# Patient Record
Sex: Female | Born: 1950 | Race: White | Hispanic: No | Marital: Single | Smoking: Current every day smoker
Health system: Southern US, Community
[De-identification: ages and names within clinical notes are randomized; demographics above are authoritative.]

## PROBLEM LIST (undated history)

## (undated) DIAGNOSIS — H8103 Meniere's disease, bilateral: Secondary | ICD-10-CM

## (undated) DIAGNOSIS — M8580 Other specified disorders of bone density and structure, unspecified site: Secondary | ICD-10-CM

## (undated) DIAGNOSIS — K5790 Diverticulosis of intestine, part unspecified, without perforation or abscess without bleeding: Secondary | ICD-10-CM

## (undated) DIAGNOSIS — M1990 Unspecified osteoarthritis, unspecified site: Secondary | ICD-10-CM

## (undated) DIAGNOSIS — H269 Unspecified cataract: Secondary | ICD-10-CM

## (undated) HISTORY — DX: Unspecified cataract: H26.9

## (undated) HISTORY — DX: Meniere's disease, bilateral: H81.03

## (undated) HISTORY — PX: THYROIDECTOMY, PARTIAL: SHX18

## (undated) HISTORY — DX: Diverticulosis of intestine, part unspecified, without perforation or abscess without bleeding: K57.90

## (undated) HISTORY — PX: ABDOMINAL HYSTERECTOMY: SHX81

## (undated) HISTORY — PX: CHOLECYSTECTOMY: SHX55

## (undated) HISTORY — DX: Other specified disorders of bone density and structure, unspecified site: M85.80

## (undated) HISTORY — PX: EYE SURGERY: SHX253

## (undated) HISTORY — DX: Unspecified osteoarthritis, unspecified site: M19.90

---

## 2018-09-17 ENCOUNTER — Ambulatory Visit: Payer: Self-pay

## 2018-09-17 NOTE — Telephone Encounter (Signed)
Pt. Reports she started having swelling and pain to right leg 1 week ago. Has tried ice, rest and elevating the leg. Pain and swelling are now in her calf. Instructed to go to ED for evaluation. Refuses, states she will go to [facility]. Pt. Spoke with Madelyn to establish care.  Reason for Disposition . Patient sounds very sick or weak to the triager  Answer Assessment - Initial Assessment Questions 1. ONSET: "When did the swelling start?" (e.g., minutes, hours, days)     1 week ago 2. LOCATION: "What part of the leg is swollen?"  "Are both legs swollen or just one leg?"     Right leg 3. SEVERITY: "How bad is the swelling?" (e.g., localized; mild, moderate, severe)  - Localized - small area of swelling localized to one leg  - MILD pedal edema - swelling limited to foot and ankle, pitting edema < 1/4 inch (6 mm) deep, rest and elevation eliminate most or all swelling  - MODERATE edema - swelling of lower leg to knee, pitting edema > 1/4 inch (6 mm) deep, rest and elevation only partially reduce swelling  - SEVERE edema - swelling extends above knee, facial or hand swelling present      Back of calf 4. REDNESS: "Does the swelling look red or infected?"     No 5. PAIN: "Is the swelling painful to touch?" If so, ask: "How painful is it?"   (Scale 1-10; mild, moderate or severe)     8 6. FEVER: "Do you have a fever?" If so, ask: "What is it, how was it measured, and when did it start?"      No 7. CAUSE: "What do you think is causing the leg swelling?"     Maybe a blood clot 8. MEDICAL HISTORY: "Do you have a history of heart failure, kidney disease, liver failure, or cancer?"     No 9. RECURRENT SYMPTOM: "Have you had leg swelling before?" If so, ask: "When was the last time?" "What happened that time?"     No 10. OTHER SYMPTOMS: "Do you have any other symptoms?" (e.g., chest pain, difficulty breathing)       No 11. PREGNANCY: "Is there any chance you are pregnant?" "When was your last menstrual  period?"       No  Protocols used: LEG SWELLING AND EDEMA-A-AH

## 2018-10-05 ENCOUNTER — Ambulatory Visit (INDEPENDENT_AMBULATORY_CARE_PROVIDER_SITE_OTHER): Payer: Medicare Other | Admitting: Family Medicine

## 2018-10-05 ENCOUNTER — Encounter: Payer: Self-pay | Admitting: Family Medicine

## 2018-10-05 ENCOUNTER — Ambulatory Visit (INDEPENDENT_AMBULATORY_CARE_PROVIDER_SITE_OTHER): Payer: Medicare Other

## 2018-10-05 ENCOUNTER — Other Ambulatory Visit: Payer: Self-pay

## 2018-10-05 VITALS — BP 126/76 | HR 78 | Temp 98.0°F | Ht 67.5 in | Wt 188.2 lb

## 2018-10-05 DIAGNOSIS — M25551 Pain in right hip: Secondary | ICD-10-CM

## 2018-10-05 DIAGNOSIS — M7121 Synovial cyst of popliteal space [Baker], right knee: Secondary | ICD-10-CM

## 2018-10-05 DIAGNOSIS — Z23 Encounter for immunization: Secondary | ICD-10-CM

## 2018-10-05 DIAGNOSIS — M8580 Other specified disorders of bone density and structure, unspecified site: Secondary | ICD-10-CM | POA: Insufficient documentation

## 2018-10-05 DIAGNOSIS — M545 Low back pain, unspecified: Secondary | ICD-10-CM | POA: Insufficient documentation

## 2018-10-05 DIAGNOSIS — H8103 Meniere's disease, bilateral: Secondary | ICD-10-CM | POA: Insufficient documentation

## 2018-10-05 MED ORDER — DICLOFENAC SODIUM 1 % TD GEL: 4.0000 g | Freq: Four times a day (QID) | TRANSDERMAL | 2 refills | Status: DC

## 2018-10-05 NOTE — Progress Notes (Signed)
Patient: Unkown Tedford MRN: [id number] DOB: 27-Dec-1950 PCP: Orma Flaming, MD     Subjective:  Chief Complaint  Patient presents with  . Establish Care    HPI: The patient is a 68 y.o. female who presents today for establishing care. She has no medical problems except for meniere's disease.   Maternal history of skin cancer, depression, CAD/MI, hyperlipidemia, HTN, CVA.  Paternal history of hearing loss, CAD/MI, HTN, CVA.  2 older sisters: HTN/hyperlipidemia Identical twin with HTN.   Tdap: 05/21/2011 Pneumovax: 02/11/2017 Colonoscopy: 11/04/2011. F/u 10 years.  Mmg: 02/13/2018 Dexa: 12/09/2016  Right knee pain: she was told she had a ruptured bakers cyst. She feels like this is resolving. She states her right knee still fills full. She was seen at [facility] and had an ultrasound done. No xray's were taken. Pain started at end of July. She did conservative therapy with ice, elevation. Pain got so bad she went and got seen. No other treatment was done. She states her pain now is an 8/10 at it's worse. Pain is sharp and stabbing. No radiation. Nothing makes it better (otc pain meds, ice/etc). She is not sure what makes it worse.   Right hip pain: stated last week. She states this is intermittent in nature. She thinks it is worse in the AM. Pain rated as an 8/10 and is sharp and shooting. Does not radiate. Pain is only a few seconds and stops with her next motion. Described as a pinch. Does not take otc medication for this. Unsure if ever had an xray. No recent trauma. She has had a couple of falls years ago. No weakness, loss of sensation.   Review of Systems  Constitutional: Negative for chills, fatigue and fever.  HENT: Positive for postnasal drip. Negative for congestion and rhinorrhea.   Eyes: Negative for visual disturbance.  Respiratory: Negative for cough, chest tightness and shortness of breath.   Cardiovascular: Positive for leg swelling. Negative for chest pain and palpitations.        C/o right calf swelling  Gastrointestinal: Negative for abdominal pain, diarrhea, nausea and vomiting.  Endocrine: Negative for polydipsia and polyuria.  Genitourinary: Negative for flank pain and urgency.  Musculoskeletal: Positive for arthralgias and back pain. Negative for neck pain.       R knee feels "warm to touch" no c/o pain  Skin: Negative for rash.  Neurological: Negative for dizziness and headaches.  Psychiatric/Behavioral: Negative for sleep disturbance. The patient is not nervous/anxious.     Allergies Patient is allergic to erythromycin and morphine and related.  Past Medical History Patient  has a past medical history of Meniere's disease, bilateral.  Surgical History Patient  has a past surgical history that includes Thyroidectomy, partial; Abdominal hysterectomy; Cholecystectomy; and Eye surgery.  Family History Pateint's family history is not on file.  Social History Patient  reports that she has been smoking cigarettes. She has never used smokeless tobacco. She reports current alcohol use of about 21.0 standard drinks of alcohol per week. She reports that she does not use drugs.    Objective: Vitals:   10/05/18 0818  BP: 126/76  Pulse: 78  Temp: 98 F (36.7 C)  TempSrc: Skin  SpO2: 96%  Weight: 188 lb 3.2 oz (85.4 kg)  Height: 5' 7.5" (1.715 m)    Body mass index is 29.04 kg/m. Physical Exam  Constitutional: She is well-developed, well-nourished, and in no distress.  Musculoskeletal:     Comments: Left knee: mild edema in popliteal fossa. ROM  wnl. Full extension to 180+ and full flexion to 90 degrees. Negative draw and mcmurrays signs. Negative pain with valgus and varus strain. Gait normal.   Left hip: strength 5/5 for adduction and abduction. Sensation intact. Point tenderness over lateral aspect of hip (greater trochanter). Gait normal.         Depression screen PHQ 2/9 10/05/2018  Decreased Interest 1  Down, Depressed, Hopeless 1  PHQ  - 2 Score 2  Altered sleeping 0  Tired, decreased energy 3  Change in appetite 0  Feeling bad or failure about yourself  0  Trouble concentrating 0  Moving slowly or fidgety/restless 0  Suicidal thoughts 0  PHQ-9 Score 5  Difficult doing work/chores Not difficult at all    Assessment/plan: 1. Need for prophylactic vaccination and inoculation against influenza High dose flu shot today.   2. Baker's cyst of knee, right Xray with no acute findings and no severe OA (mild tricompartmental disease). Discussed conservative treatment vs. Referral to sports medicine. She would like to do conservative therapy at this time as it seems to slowly be getting better.  - DG Knee 1-2 Views Right; Future  3. Right hip pain Appears like bursitis. Will do conservative treatment with voltaren gel qid, ice and exercises. If not better recommend we send her to sports med for possible injection.  - DG HIP UNILAT W OR W/O PELVIS 2-3 VIEWS RIGHT; Future  -requesting records for her HM and other treatment.   -phq9 score a 5. Very mild and due to family issues going on right now. Situational and she denies it affecting her life. Will continue to monitor, but no medication needed at this time.   F/u in one month for annual with fasting labs.    Orma Flaming, MD [place]   10/05/2018

## 2018-10-05 NOTE — Patient Instructions (Addendum)
I think you have inflammation of the bursitis of your greater trochanter of your right hip.Kimberly KitchenMarland [name]  1) I sent in anti inflammatory cream for you to use up to four times a day. You can do ice, but after 72 hours I recommend heat. Also am giving you exercises to do. If not getting better recommend we send to PT/sport medicine.   2) baker's cyst: slowly will resolve, but if continues to hurt you would get injection with sports med. Just email me and I can refer you.   Come back for annual/labs!   Also need you to request your records so I can get hard copy of mammogram, colonscopy, bone scan and shots.   So nice to meet you!  DR. Jamilee Lafosse    Hip Bursitis Rehab Ask your health care provider which exercises are safe for you. Do exercises exactly as told by your health care provider and adjust them as directed. It is normal to feel mild stretching, pulling, tightness, or discomfort as you do these exercises. Stop right away if you feel sudden pain or your pain gets worse. Do not begin these exercises until told by your health care provider. Stretching exercise This exercise warms up your muscles and joints and improves the movement and flexibility of your hip. This exercise also helps to relieve pain and stiffness. Iliotibial band stretch An iliotibial band is a strong band of muscle tissue that runs from the outer side of your hip to the outer side of your thigh and knee. 1. Lie on your side with your left / right leg in the top position. 2. Bend your left / right knee and grab your ankle. Stretch out your bottom arm to help you balance. 3. Slowly bring your knee back so your thigh is behind your body. 4. Slowly lower your knee toward the floor until you feel a gentle stretch on the outside of your left / right thigh. If you do not feel a stretch and your knee will not fall farther, place the heel of your other foot on top of your knee and pull your knee down toward the floor with your foot. 5. Hold this  position for __________ seconds. 6. Slowly return to the starting position. Repeat __________ times. Complete this exercise __________ times a day. Strengthening exercises These exercises build strength and endurance in your hip and pelvis. Endurance is the ability to use your muscles for a long time, even after they get tired. Bridge This exercise strengthens the muscles that move your thigh backward (hip extensors). 1. Lie on your back on a firm surface with your knees bent and your feet flat on the floor. 2. Tighten your buttocks muscles and lift your buttocks off the floor until your trunk is level with your thighs. ? Do not arch your back. ? You should feel the muscles working in your buttocks and the back of your thighs. If you do not feel these muscles, slide your feet 1-2 inches (2.5-5 cm) farther away from your buttocks. ? If this exercise is too easy, try doing it with your arms crossed over your chest. 3. Hold this position for __________ seconds. 4. Slowly lower your hips to the starting position. 5. Let your muscles relax completely after each repetition. Repeat __________ times. Complete this exercise __________ times a day. Squats This exercise strengthens the muscles in front of your thigh and knee (quadriceps). 1. Stand in front of a table, with your feet and knees pointing straight ahead. You may  rest your hands on the table for balance but not for support. 2. Slowly bend your knees and lower your hips like you are going to sit in a chair. ? Keep your weight over your heels, not over your toes. ? Keep your lower legs upright so they are parallel with the table legs. ? Do not let your hips go lower than your knees. ? Do not bend lower than told by your health care provider. ? If your hip pain increases, do not bend as low. 3. Hold the squat position for __________ seconds. 4. Slowly push with your legs to return to standing. Do not use your hands to pull yourself to  standing. Repeat __________ times. Complete this exercise __________ times a day. Hip hike 1. Stand sideways on a bottom step. Stand on your left / right leg with your other foot unsupported next to the step. You can hold on to the railing or wall for balance if needed. 2. Keep your knees straight and your torso square. Then lift your left / right hip up toward the ceiling. 3. Hold this position for __________ seconds. 4. Slowly let your left / right hip lower toward the floor, past the starting position. Your foot should get closer to the floor. Do not lean or bend your knees. Repeat __________ times. Complete this exercise __________ times a day. Single leg stand 1. Without shoes, stand near a railing or in a doorway. You may hold on to the railing or door frame as needed for balance. 2. Squeeze your left / right buttock muscles, then lift up your other foot. ? Do not let your left / right hip push out to the side. ? It is helpful to stand in front of a mirror for this exercise so you can watch your hip. 3. Hold this position for __________ seconds. Repeat __________ times. Complete this exercise __________ times a day. This information is not intended to replace advice given to you by your health care provider. Make sure you discuss any questions you have with your health care provider. Document Released: 03/07/2004 Document Revised: 05/25/2018 Document Reviewed: 05/25/2018 Elsevier Patient Education  2020 [employer].

## 2018-10-05 NOTE — Progress Notes (Deleted)
Phone: [phone]  Subjective:  Patient presents today for their Medicare Exam    Preventive Screening-Counseling & Management  Vision screen: *** No exam data present  Advanced directives: ***  Smoking Status: ***Never Smoker Second Hand Smoking status: ***No smokers in home  Risk Factors Regular exercise: *** Diet: ***  Fall Risk: ***None  No flowsheet data found. Opioid use history: *** no long term opioids use  Cardiac risk factors:  advanced age (older than 34 for men, 61 for women) *** Hyperlipidemia *** No diabetes. *** Family History: ***   Depression Screen None. PHQ2 0 *** No flowsheet data found.  Activities of Daily Living Independent ADLs and IADLs ***  Hearing Difficulties: ***-patient declines  Cognitive Testing No reported trouble.  ***  Normal 3 word recall  List the Names of Other Physician/Practitioners you currently use: -*** -***   There is no immunization history on file for this patient. Required Immunizations needed today ***  Screening tests- up to date Health Maintenance Due  Topic Date Due  . Hepatitis C Screening  06/06/1950  . TETANUS/TDAP  05/24/1968  . MAMMOGRAM  05/25/1999  . COLONOSCOPY  05/25/1999  . DEXA SCAN  05/25/2014  . PNA vac Low Risk Adult (1 of 2 - PCV13) 05/25/2014  . INFLUENZA VACCINE  09/12/2018    ROS- No pertinent positives discovered in course of AWV  The following were reviewed and entered/updated in epic: No past medical history on file. There are no active problems to display for this patient.  *** The histories are not reviewed yet. Please review them in the "History" navigator section and refresh this [place].  No family history on file.  Medications- reviewed and updated No current outpatient medications on file.   No current facility-administered medications for this visit.     Allergies-reviewed and updated Not on File  Social History   Socioeconomic History  .  Marital status: Not on file    Spouse name: Not on file  . Number of children: Not on file  . Years of education: Not on file  . Highest education level: Not on file  Occupational History  . Not on file  Social Needs  . Financial resource strain: Not on file  . Food insecurity    Worry: Not on file    Inability: Not on file  . Transportation needs    Medical: Not on file    Non-medical: Not on file  Tobacco Use  . Smoking status: Not on file  Substance and Sexual Activity  . Alcohol use: Not on file  . Drug use: Not on file  . Sexual activity: Not on file  Lifestyle  . Physical activity    Days per week: Not on file    Minutes per session: Not on file  . Stress: Not on file  Relationships  . Social Herbalist on phone: Not on file    Gets together: Not on file    Attends religious service: Not on file    Active member of club or organization: Not on file    Attends meetings of clubs or organizations: Not on file    Relationship status: Not on file  Other Topics Concern  . Not on file  Social History Narrative  . Not on file    Objective: There were no vitals taken for this visit. Gen: NAD, resting comfortably HEENT: Mucous membranes are moist. Oropharynx normal Neck: no thyromegaly CV: RRR no murmurs rubs or gallops Lungs:  CTAB no crackles, wheeze, rhonchi Abdomen: soft/nontender/nondistended/normal bowel sounds. No rebound or guarding.  Ext: no edema Skin: warm, dry Neuro: grossly normal, moves all extremities, PERRLA  Assessment/Plan:  Welcome to Medicare exam completed- discussed recommended screenings anddocumented any personalized health advice and referrals for preventive counseling. See AVS as well which was given to patient.   Status of chronic or acute concerns  ***  No problem-specific Assessment & Plan notes found for this encounter.   Future Appointments  Date Time Provider [place]  10/05/2018  8:20 AM Orma Flaming, MD  [facility]   No follow-ups on file.   Lab/Order associations: No diagnosis found.  No orders of the defined types were placed in this encounter.   Return precautions advised. Clearnce Sorrel Izaiah Tabb, CMA

## 2018-10-05 NOTE — Progress Notes (Deleted)
Patient: Kimberly [name] MRN: [id number] DOB: 01/14/1951 PCP: Orma Flaming, MD     Subjective:  No chief complaint on file.   HPI: The patient is a 68 y.o. female who presents today for ***  Review of Systems  Constitutional: Negative for chills, fatigue and fever.  HENT: Positive for postnasal drip. Negative for congestion and rhinorrhea.   Eyes: Negative for visual disturbance.  Respiratory: Negative for cough, chest tightness and shortness of breath.   Cardiovascular: Positive for leg swelling. Negative for chest pain and palpitations.       C/o right calf swelling  Gastrointestinal: Negative for abdominal pain, diarrhea, nausea and vomiting.  Endocrine: Negative for polydipsia and polyuria.  Genitourinary: Negative for flank pain and urgency.  Musculoskeletal: Positive for arthralgias and back pain. Negative for neck pain.       R knee feels "warm to touch" no c/o pain  Skin: Negative for rash.  Neurological: Negative for dizziness and headaches.  Psychiatric/Behavioral: Negative for sleep disturbance. The patient is not nervous/anxious.     Allergies Patient has no allergies on file.  Past Medical History Patient  has no past medical history on file.  Surgical History Patient  has no past surgical history on file.  Family History Pateint's family history is not on file.  Social History Patient      Objective: There were no vitals filed for this visit.  There is no height or weight on file to calculate BMI.  Physical Exam     Depression screen PHQ 2/9 10/05/2018  Decreased Interest 1  Down, Depressed, Hopeless 1  PHQ - 2 Score 2  Altered sleeping 0  Tired, decreased energy 3  Change in appetite 0  Feeling bad or failure about yourself  0  Trouble concentrating 0  Moving slowly or fidgety/restless 0  Suicidal thoughts 0  PHQ-9 Score 5  Difficult doing work/chores Not difficult at all    Assessment/plan:      No follow-ups on  file.     @[facility] @ 10/05/2018

## 2019-03-05 ENCOUNTER — Ambulatory Visit: Payer: Medicare Other | Attending: Internal Medicine

## 2019-03-05 DIAGNOSIS — Z23 Encounter for immunization: Secondary | ICD-10-CM | POA: Insufficient documentation

## 2019-03-05 NOTE — Progress Notes (Signed)
   Covid-19 Vaccination Clinic  Name:  Kimberly [name]    MRN: [id number] DOB: 1950-05-02  03/05/2019  [name] was observed post Covid-19 immunization for 15 minutes without incidence. She was provided with Vaccine Information Sheet and instruction to access the V-Safe system.   [name] was instructed to call 911 with any severe reactions post vaccine: Marland Kitchen Difficulty breathing  . Swelling of your face and throat  . A fast heartbeat  . A bad rash all over your body  . Dizziness and weakness    Immunizations Administered    Name Date Dose VIS Date Route   Pfizer COVID-19 Vaccine 03/05/2019  9:21 AM 0.3 mL 01/22/2019 Intramuscular   Manufacturer: [place]   Lot: [id number]   [place]: [id number]

## 2019-03-25 ENCOUNTER — Ambulatory Visit: Payer: Medicare Other | Attending: Internal Medicine

## 2019-03-25 DIAGNOSIS — Z23 Encounter for immunization: Secondary | ICD-10-CM | POA: Insufficient documentation

## 2019-03-25 NOTE — Progress Notes (Signed)
   Covid-19 Vaccination Clinic  Name:  Kimberly [name]    MRN: [id number] DOB: [birth year]  03/25/2019  [name] was observed post Covid-19 immunization for 15 minutes without incidence. She was provided with Vaccine Information Sheet and instruction to access the V-Safe system.   [name] was instructed to call 911 with any severe reactions post vaccine: Marland Kitchen Difficulty breathing  . Swelling of your face and throat  . A fast heartbeat  . A bad rash all over your body  . Dizziness and weakness    Immunizations Administered    Name Date Dose VIS Date Route   Pfizer COVID-19 Vaccine 03/25/2019  2:27 PM 0.3 mL 01/22/2019 Intramuscular   Manufacturer: [place]   Lot: [id number]   [place]: [id number]

## 2019-07-13 ENCOUNTER — Encounter: Payer: Self-pay | Admitting: Physician Assistant

## 2019-07-13 ENCOUNTER — Ambulatory Visit (INDEPENDENT_AMBULATORY_CARE_PROVIDER_SITE_OTHER): Payer: Medicare Other | Admitting: Family Medicine

## 2019-07-13 ENCOUNTER — Other Ambulatory Visit: Payer: Self-pay

## 2019-07-13 ENCOUNTER — Encounter: Payer: Self-pay | Admitting: Family Medicine

## 2019-07-13 ENCOUNTER — Ambulatory Visit (INDEPENDENT_AMBULATORY_CARE_PROVIDER_SITE_OTHER): Payer: Medicare Other | Admitting: Physician Assistant

## 2019-07-13 ENCOUNTER — Ambulatory Visit: Payer: Self-pay

## 2019-07-13 ENCOUNTER — Ambulatory Visit (INDEPENDENT_AMBULATORY_CARE_PROVIDER_SITE_OTHER): Payer: Medicare Other

## 2019-07-13 VITALS — BP 120/72 | HR 76 | Temp 97.6°F | Ht 67.5 in | Wt 190.0 lb

## 2019-07-13 VITALS — BP 116/68 | HR 68 | Ht 67.5 in | Wt 189.0 lb

## 2019-07-13 DIAGNOSIS — M25562 Pain in left knee: Secondary | ICD-10-CM

## 2019-07-13 DIAGNOSIS — S76311A Strain of muscle, fascia and tendon of the posterior muscle group at thigh level, right thigh, initial encounter: Secondary | ICD-10-CM | POA: Diagnosis not present

## 2019-07-13 NOTE — Assessment & Plan Note (Addendum)
Partial tear of the right hamstring noted.  No pain with more of extension of the leg.  We discussed thigh compression sleeve, potential heel lift, shorter stride length, icing regimen, topical anti-inflammatories.  Patient declined formal physical therapy and likely will do relatively well with conservative therapy.  Follow-up with me again in 4 to 6 weeks we did discuss with patient about the potential for a DVT.  Patient had a potential scare previously last year and went to the emergency room.  Patient would like to avoid doing that again.  Did not feel though that she needed a scan today.

## 2019-07-13 NOTE — Patient Instructions (Addendum)
It was great to see you!  A referral has been placed for you to see Dr. Lynne Leader or Dr. [name] with [facility]. Someone from there office will be in touch soon regarding your appointment with him. His location: [place] at [facility] [address] on the 1st floor.   Phone number [phone], Fax [phone].  This location is across the street from the entrance to [employer] and in the same complex as the [facility] and [employer]   Acute Knee Pain, Adult Many things can cause knee pain. Sometimes, knee pain is sudden (acute) and may be caused by damage, swelling, or irritation of the muscles and tissues that support your knee. The pain often goes away on its own with time and rest. If the pain does not go away, tests may be done to find out what is causing the pain. Follow these instructions at home: Pay attention to any changes in your symptoms. Take these actions to relieve your pain. If you have a knee sleeve or brace:   Wear the sleeve or brace as told by your doctor. Remove it only as told by your doctor.  Loosen the sleeve or brace if your toes: ? Tingle. ? Become numb. ? Turn cold and blue.  Keep the sleeve or brace clean.  If the sleeve or brace is not waterproof: ? Do not let it get wet. ? Cover it with a watertight covering when you take a bath or shower. Activity  Rest your knee.  Do not do things that cause pain.  Avoid activities where both feet leave the ground at the same time (high-impact activities). Examples are running, jumping rope, and doing jumping jacks.  Work with a physical therapist to make a safe exercise program, as told by your doctor. Managing pain, stiffness, and swelling   If told, put ice on the knee: ? Put ice in a plastic bag. ? Place a towel between your skin and the bag. ? Leave the ice on for 20 minutes, 2-3 times a day.  If told, put pressure (compression) on  your injured knee to control swelling, give support, and help with discomfort. Compression may be done with an elastic bandage. General instructions  Take all medicines only as told by your doctor.  Raise (elevate) your knee while you are sitting or lying down. Make sure your knee is higher than your heart.  Sleep with a pillow under your knee.  Do not use any products that contain nicotine or tobacco. These include cigarettes, e-cigarettes, and chewing tobacco. These products may slow down healing. If you need help quitting, ask your doctor.  If you are overweight, work with your doctor and a food expert (dietitian) to set goals to lose weight. Being overweight can make your knee hurt more.  Keep all follow-up visits as told by your doctor. This is important. Contact a doctor if:  The knee pain does not stop.  The knee pain changes or gets worse.  You have a fever along with knee pain.  Your knee feels warm when you touch it.  Your knee gives out or locks up. Get help right away if:  Your knee swells, and the swelling gets worse.  You cannot move your knee.  You have very bad knee pain. Summary  Many things can cause knee pain. The pain often goes away on its own with time and rest.  Your doctor may do tests to find  out the cause of the pain.  Pay attention to any changes in your symptoms. Relieve your pain with rest, medicines, light activity, and use of ice.  Get help right away if you cannot move your knee or your knee pain is very bad. This information is not intended to replace advice given to you by your health care provider. Make sure you discuss any questions you have with your health care provider. Document Revised: 07/10/2017 Document Reviewed: 07/10/2017 Elsevier Patient Education  [employer].   If you do not hear anything by the end of the day -- please call our office.  Take care,  Inda Coke PA-C

## 2019-07-13 NOTE — Progress Notes (Addendum)
Kimberly [name] is a 69 y.o. female here for a new problem.  I acted as a Education administrator for [employer], PA-C Anselmo Pickler, LPN   History of Present Illness:   Chief Complaint  Patient presents with  . Knee Pain    HPI   Knee pain Pt c/o left knee pain, started about a week ago with stiffness in the morning. Yesterday woke up with pain and unable to bear weight on it yesterday. Hears clicking in knee. She took aleve but caused her feet to swell and lower abd pain. She took tylenol and this caused abdominal pain too.  Denies any significant trauma but states that she "may have" twisted it over the weekend. Denies calf tenderness, numbness/tingling.   Past Medical History:  Diagnosis Date  . Meniere's disease, bilateral      Social History   Tobacco Use  . Smoking status: Current Every Day Smoker    Types: Cigarettes  . Smokeless tobacco: Never Used  Substance Use Topics  . Alcohol use: Yes    Alcohol/week: 21.0 standard drinks    Types: 21 Glasses of wine per week    Comment: 3 glasses of wine per day  . Drug use: Never    Past Surgical History:  Procedure Laterality Date  . ABDOMINAL HYSTERECTOMY    . CHOLECYSTECTOMY    . EYE SURGERY    . THYROIDECTOMY, PARTIAL      History reviewed. No pertinent family history.  Allergies  Allergen Reactions  . Erythromycin Diarrhea  . Clindamycin/Lincomycin Nausea And Vomiting  . Morphine And Related Itching    Current Medications:   Current Outpatient Medications:  .  acetaminophen (TYLENOL) 500 MG tablet, Take 1,000 mg by mouth every 6 (six) hours as needed., Disp: , Rfl:  .  aspirin EC 81 MG tablet, Take 81 mg by mouth daily., Disp: , Rfl:  .  calcium-vitamin D (OSCAL WITH D) 250-125 MG-UNIT tablet, Take 1 tablet by mouth daily., Disp: , Rfl:  .  cetirizine (ZYRTEC) 10 MG tablet, Take 10 mg by mouth daily., Disp: , Rfl:  .  Multiple Vitamins-Minerals (HAIR SKIN AND NAILS FORMULA PO), Take by mouth., Disp: , Rfl:  .   Multiple Vitamins-Minerals (WOMENS MULTIVITAMIN PO), Take by mouth., Disp: , Rfl:  .  vitamin E (VITAMIN E) 200 UNIT capsule, Take 200 Units by mouth daily., Disp: , Rfl:    Review of Systems:   ROS  Negative unless otherwise specified per HPI.  Vitals:   Vitals:   07/13/19 0903  BP: 120/72  Pulse: 76  Temp: 97.6 F (36.4 C)  TempSrc: Temporal  SpO2: 98%  Weight: 190 lb (86.2 kg)  Height: 5' 7.5" (1.715 m)     Body mass index is 29.32 kg/m.  Physical Exam:   Physical Exam Constitutional:      Appearance: She is well-developed.  HENT:     Head: Normocephalic and atraumatic.  Eyes:     Conjunctiva/sclera: Conjunctivae normal.  Cardiovascular:     Pulses:          Dorsalis pedis pulses are 2+ on the left side.       Posterior tibial pulses are 2+ on the left side.  Pulmonary:     Effort: Pulmonary effort is normal.  Musculoskeletal:        General: Normal range of motion.     Cervical back: Normal range of motion and neck supple.     Comments: Decreased FROM of L knee, mostly with  passive extension. TTP of medial L knee.   Skin:    General: Skin is warm and dry.  Neurological:     Mental Status: She is alert and oriented to person, place, and time.  Psychiatric:        Behavior: Behavior normal.        Thought Content: Thought content normal.        Judgment: Judgment normal.     No results found for this or any previous visit.  Assessment and Plan:   Lanasha was seen today for knee pain.  Diagnoses and all orders for this visit:  Acute pain of left knee -     Ambulatory referral to Sports Medicine   Urgent referral to sports medicine. Offered xray but we decided to defer imaging to sports medicine. Offered toradol injection but she declined, given recent swelling with advil. Knee wrapped with ACE bandage which was provided by our office, basic RICE discussed. Offered crutches, but patient declined. Consider purchasing OTC knee brace if needs further  stabilization, however hopefully she will be able to see sports medicine soon.  . Reviewed expectations re: course of current medical issues. . Discussed self-management of symptoms. . Outlined signs and symptoms indicating need for more acute intervention. . Patient verbalized understanding and all questions were answered. . See orders for this visit as documented in the electronic medical record. . Patient received an After-Visit Summary.  CMA or LPN served as scribe during this visit. History, Physical, and Plan performed by medical provider. The above documentation has been reviewed and is accurate and complete.  Inda Coke, PA-C

## 2019-07-13 NOTE — Patient Instructions (Addendum)
Xray today Thigh compression sleeve- Tommy Copper Voltarn gel  Ice 20 min 2x a day Exercises in one week Shorter stride length See me again in 4 weeks for manipulation

## 2019-07-13 NOTE — Progress Notes (Signed)
Kimberly Kimberly [name] [place] Phone: [phone] Subjective:   Kimberly Kimberly [name], am serving as a scribe for Dr. Hulan Saas. This visit occurred during the SARS-CoV-2 public health emergency.  Safety protocols were in place, including screening questions prior to the visit, additional usage of staff PPE, and extensive cleaning of exam room while observing appropriate contact time as indicated for disinfecting solutions.   I'm seeing this patient by the request  of:  Kimberly Flaming, MD  CC: Left knee pain  [id number]  Kimberly Kimberly [name] is a 69 y.o. female coming in with complaint of left knee pain. Patient states that for a few days her knee pain has progressively gotten worse. Antalgic gait. Using Aleve and Tylenol for pain. Patient states that she might have twisted knee in yard. Pain over medial aspect of knee.  Patient thinks that it seems to be worse when she tries to take longer strides.     Past Medical History:  Diagnosis Date  . Meniere's disease, bilateral    Past Surgical History:  Procedure Laterality Date  . ABDOMINAL HYSTERECTOMY    . CHOLECYSTECTOMY    . EYE SURGERY    . THYROIDECTOMY, PARTIAL     Social History   Socioeconomic History  . Marital status: Single    Spouse name: Not on file  . Number of children: Not on file  . Years of education: Not on file  . Highest education level: Not on file  Occupational History  . Not on file  Tobacco Use  . Smoking status: Current Every Day Smoker    Types: Cigarettes  . Smokeless tobacco: Never Used  Substance and Sexual Activity  . Alcohol use: Yes    Alcohol/week: 21.0 standard drinks    Types: 21 Glasses of wine per week    Comment: 3 glasses of wine per day  . Drug use: Never  . Sexual activity: Not Currently  Other Topics Concern  . Not on file  Social History Narrative  . Not on file   Social Determinants of Health   Financial Resource Strain:   .  Difficulty of Paying Living Expenses:   Food Insecurity:   . Worried About Charity fundraiser in the Last Year:   . Arboriculturist in the Last Year:   Transportation Needs:   . Film/video editor (Medical):   Marland Kitchen Lack of Transportation (Non-Medical):   Physical Activity:   . Days of Exercise per Week:   . Minutes of Exercise per Session:   Stress:   . Feeling of Stress :   Social Connections:   . Frequency of Communication with Friends and Family:   . Frequency of Social Gatherings with Friends and Family:   . Attends Religious Services:   . Active Member of Clubs or Organizations:   . Attends Archivist Meetings:   Marland Kitchen Marital Status:    Allergies  Allergen Reactions  . Erythromycin Diarrhea  . Clindamycin/Lincomycin Nausea And Vomiting  . Morphine And Related Itching   Kimberly [name] family history on file.    Current Outpatient Medications (Respiratory):  .  cetirizine (ZYRTEC) 10 MG tablet, Take 10 mg by mouth daily.  Current Outpatient Medications (Analgesics):  .  aspirin EC 81 MG tablet, Take 81 mg by mouth daily. Marland Kitchen  acetaminophen (TYLENOL) 500 MG tablet, Take 1,000 mg by mouth every 6 (six) hours as needed.   Current Outpatient Medications (Other):  .  calcium-vitamin D (OSCAL WITH D) 250-125 MG-UNIT tablet, Take 1 tablet by mouth daily. .  Multiple Vitamins-Minerals (HAIR SKIN AND NAILS FORMULA PO), Take by mouth. .  Multiple Vitamins-Minerals (WOMENS MULTIVITAMIN PO), Take by mouth. .  vitamin E (VITAMIN E) 200 UNIT capsule, Take 200 Units by mouth daily.   Reviewed prior external information including notes and imaging from  primary care provider As well as notes that were available from care everywhere and other healthcare systems.  Past medical history, social, surgical and family history all reviewed in electronic medical record.  Kimberly [name] unless stated regarding to the chief complaint.   Review of Systems:  Kimberly [name] headache, visual  changes, nausea, vomiting, diarrhea, constipation, dizziness, abdominal pain, skin rash, fevers, chills, night sweats, weight loss, swollen lymph nodes, body aches, joint swelling, chest pain, shortness of breath, mood changes. POSITIVE muscle aches, may be joint swelling  Objective  Blood pressure 116/68, pulse 68, height 5' 7.5" (1.715 m), weight 189 lb (85.7 kg), SpO2 98 %.   General: Kimberly [name] apparent distress alert and oriented x3 mood and affect normal, dressed appropriately.  HEENT: Pupils unequal with some mild strabismus Respiratory: Patient's speak in full sentences and does not appear short of breath  Cardiovascular: Trace lower extremity edema, non tender, Kimberly [name] erythema  Neuro: Cranial nerves II through XII are intact, neurovascularly intact in all extremities with 2+ DTRs and 2+ pulses.  Gait antalgic MSK:  tender with mild limited range of motion on multiple joints. Left knee exam does have tenderness to palpation over the medial joint line.  Patient does have tightness of the hamstrings noted and more pain on the distal hamstrings of the knee itself.  Patient has negative McMurray's.  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  Moderate arthritic changes of the patellofemoral joint noted.  Lateral joint line was fairly unremarkable.  Kimberly [name] significant swelling noted.  Patient does have what appears to be a partial tear of the distal hamstring tendon. Impression: Hamstring tear   Impression and Recommendations:     The above documentation has been reviewed and is accurate and complete Kimberly Pulley, DO       Note: This dictation was prepared with Dragon dictation along with smaller phrase technology. Any transcriptional errors that result from this process are unintentional.

## 2019-07-23 ENCOUNTER — Encounter: Payer: Self-pay | Admitting: Family Medicine

## 2019-08-10 ENCOUNTER — Encounter: Payer: Self-pay | Admitting: Family Medicine

## 2019-08-10 ENCOUNTER — Ambulatory Visit (INDEPENDENT_AMBULATORY_CARE_PROVIDER_SITE_OTHER): Payer: Medicare Other | Admitting: Family Medicine

## 2019-08-10 ENCOUNTER — Other Ambulatory Visit: Payer: Self-pay

## 2019-08-10 DIAGNOSIS — S76311A Strain of muscle, fascia and tendon of the posterior muscle group at thigh level, right thigh, initial encounter: Secondary | ICD-10-CM

## 2019-08-10 DIAGNOSIS — M545 Low back pain, unspecified: Secondary | ICD-10-CM

## 2019-08-10 DIAGNOSIS — M999 Biomechanical lesion, unspecified: Secondary | ICD-10-CM | POA: Diagnosis not present

## 2019-08-10 NOTE — Assessment & Plan Note (Signed)
Known arthritic changes.  Exacerbation of his underlying problem.  Patient is doing relatively well with the treatment and it did attempt osteopathic manipulation today.  Discussed with patient about icing regimen and home exercises, which activities to do which wants to avoid.  Increase activity as tolerated.  Follow-up again in 4 to 8 weeks

## 2019-08-10 NOTE — Patient Instructions (Signed)
See me again in 5-6 weeks 

## 2019-08-10 NOTE — Assessment & Plan Note (Signed)
Improvement noted at this time.  Should do relatively well with conservative therapy and follow-up again in 6 weeks

## 2019-08-10 NOTE — Assessment & Plan Note (Signed)

## 2019-08-10 NOTE — Progress Notes (Signed)
[place] Phone: [phone] Subjective:   Kimberly [name], am serving as a scribe for Dr. Hulan Saas. This visit occurred during the SARS-CoV-2 public health emergency.  Safety protocols were in place, including screening questions prior to the visit, additional usage of staff PPE, and extensive cleaning of exam room while observing appropriate contact time as indicated for disinfecting solutions.   I'm seeing this patient by the request  of:  Orma Flaming, MD  CC: Leg pain, back pain follow-up  IOX:[id number]   07/13/2019 Partial tear of the right hamstring noted.  [name] pain with more of extension of the leg.  We discussed thigh compression sleeve, potential heel lift, shorter stride length, icing regimen, topical anti-inflammatories.  Patient declined formal physical therapy and likely will do relatively well with conservative therapy.  Follow-up with me again in 4 to 6 weeks we did discuss with patient about the potential for a DVT.  Patient had a potential scare previously last year and went to the emergency room.  Patient would like to avoid doing that again.  Did not feel though that she needed a scan today.  Update 08/10/2019 Kimberly [name] is a 69 y.o. female coming in with complaint of right knee and hamstring pain. States that her pain dissipated the day after seeing [place].  Patient states that overall she is feeling better.  Having some tightness of the lower back.  Known arthritic changes.  Would like to be further evaluated for this.  Patient denies any significant radiation down the legs.  States that he has soreness in the back on a fairly regular basis though.    Past Medical History:  Diagnosis Date  . Meniere's disease, bilateral    Past Surgical History:  Procedure Laterality Date  . ABDOMINAL HYSTERECTOMY    . CHOLECYSTECTOMY    . EYE SURGERY    . THYROIDECTOMY, PARTIAL     Social History    Socioeconomic History  . Marital status: Single    Spouse name: Not on file  . Number of children: Not on file  . Years of education: Not on file  . Highest education level: Not on file  Occupational History  . Not on file  Tobacco Use  . Smoking status: Current Every Day Smoker    Types: Cigarettes  . Smokeless tobacco: Never Used  Vaping Use  . Vaping Use: Never used  Substance and Sexual Activity  . Alcohol use: Yes    Alcohol/week: 21.0 standard drinks    Types: 21 Glasses of wine per week    Comment: 3 glasses of wine per day  . Drug use: Never  . Sexual activity: Not Currently  Other Topics Concern  . Not on file  Social History Narrative  . Not on file   Social Determinants of Health   Financial Resource Strain:   . Difficulty of Paying Living Expenses:   Food Insecurity:   . Worried About Charity fundraiser in the Last Year:   . Arboriculturist in the Last Year:   Transportation Needs:   . Film/video editor (Medical):   Marland Kitchen Lack of Transportation (Non-Medical):   Physical Activity:   . Days of Exercise per Week:   . Minutes of Exercise per Session:   Stress:   . Feeling of Stress :   Social Connections:   . Frequency of Communication with Friends and Family:   . Frequency of  Social Gatherings with Friends and Family:   . Attends Religious Services:   . Active Member of Clubs or Organizations:   . Attends Archivist Meetings:   Marland Kitchen Marital Status:    Allergies  Allergen Reactions  . Erythromycin Diarrhea  . Clindamycin/Lincomycin Nausea And Vomiting  . Morphine And Related Itching   [name] family history on file.    Current Outpatient Medications (Respiratory):  .  cetirizine (ZYRTEC) 10 MG tablet, Take 10 mg by mouth daily.  Current Outpatient Medications (Analgesics):  .  aspirin EC 81 MG tablet, Take 81 mg by mouth daily. Marland Kitchen  acetaminophen (TYLENOL) 500 MG tablet, Take 1,000 mg by mouth every 6 (six) hours as needed.   Current  Outpatient Medications (Other):  .  calcium-vitamin D (OSCAL WITH D) 250-125 MG-UNIT tablet, Take 1 tablet by mouth daily. .  Multiple Vitamins-Minerals (HAIR SKIN AND NAILS FORMULA PO), Take by mouth. .  Multiple Vitamins-Minerals (WOMENS MULTIVITAMIN PO), Take by mouth. .  vitamin E (VITAMIN E) 200 UNIT capsule, Take 200 Units by mouth daily.   Reviewed prior external information including notes and imaging from  primary care provider As well as notes that were available from care everywhere and other healthcare systems.  Past medical history, social, surgical and family history all reviewed in electronic medical record.  [name] unless stated regarding to the chief complaint.   Review of Systems:  [name] headache, visual changes, nausea, vomiting, diarrhea, constipation, dizziness, abdominal pain, skin rash, fevers, chills, night sweats, weight loss, swollen lymph nodes,  joint swelling, chest pain, shortness of breath, mood changes. POSITIVE muscle aches, body aches  Objective  Blood pressure 114/66, pulse 69, height 5' 7.5" (1.715 m), weight 190 lb (86.2 kg), SpO2 97 %.   General: [name] apparent distress alert and oriented x3 mood and affect normal, dressed appropriately.  HEENT: Pupils equal, extraocular movements intact  Respiratory: Patient's speak in full sentences and does not appear short of breath  Cardiovascular: [name] lower extremity edema, non tender, [name] erythema  Neuro: Cranial nerves II through XII are intact, neurovascularly intact in all extremities with 2+ DTRs and 2+ pulses.  Gait antalgic MSK: Patient's back exam shows some degenerative scoliosis of the lumbar spine.  Patient has tightness of the hamstrings bilaterally right greater than left.  [name] weakness though noted.  [name] radicular symptoms.  Some mild tenderness to palpation in the paraspinal musculature of the lumbar spine as well.  Osteopathic findings  T4 extended rotated and side bent left L1 flexed  rotated and side bent right L3 flexed rotated and side bent left  sacrum forward flexion     Impression and Recommendations:     The above documentation has been reviewed and is accurate and complete Lyndal Pulley, DO       Note: This dictation was prepared with Dragon dictation along with smaller phrase technology. Any transcriptional errors that result from this process are unintentional.

## 2019-08-11 ENCOUNTER — Encounter: Payer: Self-pay | Admitting: Family Medicine

## 2019-08-13 ENCOUNTER — Encounter: Payer: Self-pay | Admitting: Family Medicine

## 2019-09-01 ENCOUNTER — Encounter: Payer: Self-pay | Admitting: Family Medicine

## 2019-09-01 ENCOUNTER — Encounter: Payer: Self-pay | Admitting: Gastroenterology

## 2019-09-01 ENCOUNTER — Other Ambulatory Visit: Payer: Self-pay

## 2019-09-01 ENCOUNTER — Ambulatory Visit (INDEPENDENT_AMBULATORY_CARE_PROVIDER_SITE_OTHER): Payer: Medicare Other | Admitting: Family Medicine

## 2019-09-01 VITALS — BP 108/60 | HR 80 | Temp 97.9°F | Ht 68.0 in | Wt 189.8 lb

## 2019-09-01 DIAGNOSIS — R319 Hematuria, unspecified: Secondary | ICD-10-CM

## 2019-09-01 DIAGNOSIS — Z23 Encounter for immunization: Secondary | ICD-10-CM

## 2019-09-01 DIAGNOSIS — R7309 Other abnormal glucose: Secondary | ICD-10-CM

## 2019-09-01 DIAGNOSIS — E782 Mixed hyperlipidemia: Secondary | ICD-10-CM | POA: Diagnosis not present

## 2019-09-01 DIAGNOSIS — M8580 Other specified disorders of bone density and structure, unspecified site: Secondary | ICD-10-CM | POA: Diagnosis not present

## 2019-09-01 DIAGNOSIS — N959 Unspecified menopausal and perimenopausal disorder: Secondary | ICD-10-CM

## 2019-09-01 DIAGNOSIS — Z1231 Encounter for screening mammogram for malignant neoplasm of breast: Secondary | ICD-10-CM | POA: Diagnosis not present

## 2019-09-01 DIAGNOSIS — Z1159 Encounter for screening for other viral diseases: Secondary | ICD-10-CM

## 2019-09-01 DIAGNOSIS — Z0000 Encounter for general adult medical examination without abnormal findings: Secondary | ICD-10-CM

## 2019-09-01 DIAGNOSIS — R194 Change in bowel habit: Secondary | ICD-10-CM

## 2019-09-01 NOTE — Patient Instructions (Addendum)
-  come back for fasting labs. Water and black coffee okay and we can see about need for cholesterol medication. Just make lab appointment.   -ordered a mammogram for you, they should call you and set this up.   -need a bone scan in October of this year, I put in order for this so you can hopefully set up mammogram and DEXA at same time.   -last pneumonia shot today. (pneumovax 23), otherwise up to date.   -referral to GI for possible cscope with noticeable change in bowel habits. They will call you for this.

## 2019-09-01 NOTE — Progress Notes (Signed)
Patient: Kimberly [name] MRN: [id number] DOB: 1950-08-10 PCP: Orma Flaming, MD     Subjective:  Chief Complaint  Patient presents with  . Annual Exam  . Hyperlipidemia  . osteopenia    HPI: The patient is a 69 y.o. female who presents today for annual exam. Past medical history significant for hyperlipidemia, osteopenia and spinal stenosis. She denies any changes to past medical history. There have been no recent hospitalizations. They are following a well balanced diet but, no exercise plan due to hamstring pull. Weight has been stable. Pt is requesting lab work follow up from [facility]. Patient is not fasting.  Hyperlipidemia -she is not fasting today. Will come back fasting. She is on no medication. Last cholesterol panel was from 2018. LDL was 137 and total cholesterol 213. She has history of CVA in her mother and father. Cholesterol and blood pressure with her sister. She smokes 10 cigarette/day. Not on any statin therapy. She has never been on medication. The 10-year ASCVD risk score Mikey Bussing DC Jr., et al., 2013) is: 11.4%   Change in bowel habits. She had a noticeable change in BM 2 years ago when she moved here. She has "loose, splattery" stools. Better with high protein meals. Denies any abdominal pain, blood in stool. Does feel bloated. No family history of colon cancer.   -due for mmg/dexa, these will be ordered.  -HM reviewed.   Immunization History  Administered Date(s) Administered  . Fluad Quad(high Dose 65+) 10/05/2018  . PFIZER SARS-COV-2 Vaccination 03/05/2019, 03/25/2019  . Pneumococcal Polysaccharide-23 09/01/2019  . Zoster Recombinat (Shingrix) 12/01/2017   Colonoscopy: 11/04/2011 Mammogram: 2020. Due for this.  Pap smear: n/a  Has had covid and shingles vaccine.    Review of Systems  Constitutional: Negative for chills, fatigue and fever.  HENT: Negative for dental problem, ear pain, hearing loss and trouble swallowing.   Eyes: Negative for visual  disturbance.  Respiratory: Negative for cough, chest tightness and shortness of breath.   Cardiovascular: Negative for chest pain, palpitations and leg swelling.  Gastrointestinal: Positive for diarrhea. Negative for abdominal pain, blood in stool and nausea.  Endocrine: Negative for cold intolerance, polydipsia, polyphagia and polyuria.  Genitourinary: Negative for dysuria and hematuria.  Musculoskeletal: Negative for arthralgias.  Skin: Negative for rash.  Neurological: Negative for dizziness and headaches.  Psychiatric/Behavioral: Negative for dysphoric mood and sleep disturbance. The patient is not nervous/anxious.     Allergies Patient is allergic to erythromycin, clindamycin/lincomycin, and morphine and related.  Past Medical History Patient  has a past medical history of Meniere's disease, bilateral.  Surgical History Patient  has a past surgical history that includes Thyroidectomy, partial; Abdominal hysterectomy; Cholecystectomy; and Eye surgery.  Family History Pateint's family history is not on file.  Social History Patient  reports that she has been smoking cigarettes. She has never used smokeless tobacco. She reports current alcohol use of about 21.0 standard drinks of alcohol per week. She reports that she does not use drugs.    Objective: Vitals:   09/01/19 0912  BP: 108/60  Pulse: 80  Temp: 97.9 F (36.6 C)  TempSrc: Temporal  SpO2: 96%  Weight: 189 lb 12.8 oz (86.1 kg)  Height: 5\' 8"  (1.727 m)    Body mass index is 28.86 kg/m.  Physical Exam Vitals reviewed.  Constitutional:      Appearance: Normal appearance. She is well-developed and normal weight.  HENT:     Head: Normocephalic and atraumatic.     Right Ear: Tympanic membrane,  ear canal and external ear normal.     Left Ear: Tympanic membrane, ear canal and external ear normal.     Nose: Nose normal.     Mouth/Throat:     Mouth: Mucous membranes are moist.  Eyes:     Conjunctiva/sclera:  Conjunctivae normal.     Pupils: Pupils are equal, round, and reactive to light.     Comments: Strabismus of eye   Neck:     Thyroid: No thyromegaly.  Cardiovascular:     Rate and Rhythm: Normal rate and regular rhythm.     Pulses: Normal pulses.     Heart sounds: Normal heart sounds. No murmur heard.   Pulmonary:     Effort: Pulmonary effort is normal.     Breath sounds: Normal breath sounds.  Abdominal:     General: Bowel sounds are normal. There is no distension.     Palpations: Abdomen is soft.     Tenderness: There is no abdominal tenderness.  Musculoskeletal:     Cervical back: Normal range of motion and neck supple.  Lymphadenopathy:     Cervical: No cervical adenopathy.  Skin:    General: Skin is warm and dry.     Capillary Refill: Capillary refill takes less than 2 seconds.     Findings: No rash.  Neurological:     General: No focal deficit present.     Mental Status: She is alert and oriented to person, place, and time.     Cranial Nerves: No cranial nerve deficit.     Coordination: Coordination normal.     Deep Tendon Reflexes: Reflexes normal.  Psychiatric:        Mood and Affect: Mood normal.        Behavior: Behavior normal.      Office Visit from 09/01/2019 in [place]  PHQ-2 Total Score 0         Assessment/plan: 1. Annual physical exam Routine labs, will come back fasting. Hm reviewed. We ordered a DEXA, giving pneumovax, ordered mmg. Encouraged exercise. She is smoking 10 cigarettes/day.discussed stopping. Drinking about one bottle of wine/day. Dicussed alcohol intake. F/u in one year or as needed.  Patient counseling [x]    Nutrition: Stressed importance of moderation in sodium/caffeine intake, saturated fat and cholesterol, caloric balance, sufficient intake of fresh fruits, vegetables, fiber, calcium, iron, and 1 mg of folate supplement per day (for females capable of pregnancy).  [x]    Stressed the importance of regular  exercise.   []    Substance Abuse: Discussed cessation/primary prevention of tobacco, alcohol, or other drug use; driving or other dangerous activities under the influence; availability of treatment for abuse.   [x]    Injury prevention: Discussed safety belts, safety helmets, smoke detector, smoking near bedding or upholstery.   [x]    Sexuality: Discussed sexually transmitted diseases, partner selection, use of condoms, avoidance of unintended pregnancy  and contraceptive alternatives.  [x]    Dental health: Discussed importance of regular tooth brushing, flossing, and dental visits.  [x]    Health maintenance and immunizations reviewed. Please refer to Health maintenance section.    Patient counseling [x]    Nutrition: Stressed importance of moderation in sodium/caffeine intake, saturated fat and cholesterol, caloric balance, sufficient intake of fresh fruits, vegetables, fiber, calcium, iron, and 1 mg of folate supplement per day (for females capable of pregnancy).  [x]    Stressed the importance of regular exercise.   []    Substance Abuse: Discussed cessation/primary prevention of tobacco, alcohol, or other drug  use; driving or other dangerous activities under the influence; availability of treatment for abuse.   [x]    Injury prevention: Discussed safety belts, safety helmets, smoke detector, smoking near bedding or upholstery.   [x]    Sexuality: Discussed sexually transmitted diseases, partner selection, use of condoms, avoidance of unintended pregnancy  and contraceptive alternatives.  [x]    Dental health: Discussed importance of regular tooth brushing, flossing, and dental visits.  [x]    Health maintenance and immunizations reviewed. Please refer to Health maintenance section.    - TSH; Future - TSH  2. Mixed hyperlipidemia Discussed ASCVD risk, will get labs today and see where she is at. She is not opposed to statin therapy.  - Lipid panel; Future - Comprehensive metabolic panel; Future -  CBC with Differential/Platelet; Future - CBC with Differential/Platelet - Comprehensive metabolic panel - Lipid panel  3. Osteopenia, unspecified location On calcium and vitamin D. Encouraged weight bearing activities. dexa ordered.   4. Encounter for hepatitis C screening test for low risk patient  - Hepatitis C antibody; Future - Hepatitis C antibody  5. Encounter for screening mammogram for malignant neoplasm of breast  - MM Digital Screening; Future  6. Menopausal disorder  - DG Bone Density; Future  7. Abnormal blood sugar Labs reviewed from 2018, elevated BS, adding on a 1c.  - Hemoglobin A1c; Future - Hemoglobin A1c  8. Need for vaccination for Strep pneumoniae  - Pneumococcal polysaccharide vaccine 23-valent greater than or equal to 2yo subcutaneous/IM  9. Change in bowel habit  - Ambulatory referral to Gastroenterology  10. Hematuria of unknown cause Remote hx of this. No records. Will check UA with micro. Doesn't recall being seen by urology.  - Urine Microscopic; Future    This visit occurred during the SARS-CoV-2 public health emergency.  Safety protocols were in place, including screening questions prior to the visit, additional usage of staff PPE, and extensive cleaning of exam room while observing appropriate contact time as indicated for disinfecting solutions.     Return in about 1 year (around 08/31/2020) for hyperlipidemia/annual .     Orma Flaming, MD [place]  09/01/2019

## 2019-09-02 ENCOUNTER — Other Ambulatory Visit: Payer: Medicare Other

## 2019-09-02 DIAGNOSIS — R319 Hematuria, unspecified: Secondary | ICD-10-CM

## 2019-09-02 NOTE — Addendum Note (Signed)
Addended by: Liliane Channel on: 09/02/2019 08:12 AM   Modules accepted: Orders

## 2019-09-03 ENCOUNTER — Encounter: Payer: Self-pay | Admitting: Family Medicine

## 2019-09-03 LAB — HEPATITIS C ANTIBODY
Hepatitis C Ab: NONREACTIVE
SIGNAL TO CUT-OFF: 0 (ref <1.00)

## 2019-09-03 LAB — LIPID PANEL
Cholesterol: 199 mg/dL (ref <200)
HDL: 62 mg/dL (ref >=50)
LDL Cholesterol (Calc): 118 mg/dL (calc) — ABNORMAL HIGH
Non-HDL Cholesterol (Calc): 137 mg/dL (calc) — ABNORMAL HIGH (ref <130)
Total CHOL/HDL Ratio: 3.2 (calc) (ref <5.0)
Triglycerides: 89 mg/dL (ref <150)

## 2019-09-03 LAB — CBC WITH DIFFERENTIAL/PLATELET
Absolute Monocytes: 1178 cells/uL — ABNORMAL HIGH (ref 200–950)
Basophils Absolute: 28 cells/uL (ref 0–200)
Basophils Relative: 0.3 %
Eosinophils Absolute: 37 cells/uL (ref 15–500)
Eosinophils Relative: 0.4 %
HCT: 44 % (ref 35.0–45.0)
Hemoglobin: 14.3 g/dL (ref 11.7–15.5)
Lymphs Abs: 2180 cells/uL (ref 850–3900)
MCH: 26.2 pg — ABNORMAL LOW (ref 27.0–33.0)
MCHC: 32.5 g/dL (ref 32.0–36.0)
MCV: 80.7 fL (ref 80.0–100.0)
MPV: 11.1 fL (ref 7.5–12.5)
Monocytes Relative: 12.8 %
Neutro Abs: 5778 cells/uL (ref 1500–7800)
Neutrophils Relative %: 62.8 %
Platelets: 254 10*3/uL (ref 140–400)
RBC: 5.45 10*6/uL — ABNORMAL HIGH (ref 3.80–5.10)
RDW: 13.6 % (ref 11.0–15.0)
Total Lymphocyte: 23.7 %
WBC: 9.2 10*3/uL (ref 3.8–10.8)

## 2019-09-03 LAB — URINALYSIS, MICROSCOPIC ONLY
Bacteria, UA: NONE SEEN /HPF
Hyaline Cast: NONE SEEN /LPF
Squamous Epithelial / HPF: NONE SEEN /HPF (ref <=5)

## 2019-09-03 LAB — COMPREHENSIVE METABOLIC PANEL
AG Ratio: 1.7 (calc) (ref 1.0–2.5)
ALT: 17 U/L (ref 6–29)
AST: 15 U/L (ref 10–35)
Albumin: 4.2 g/dL (ref 3.6–5.1)
Alkaline phosphatase (APISO): 107 U/L (ref 37–153)
BUN/Creatinine Ratio: 17 (calc) (ref 6–22)
BUN: 17 mg/dL (ref 7–25)
CO2: 27 mmol/L (ref 20–32)
Calcium: 9.3 mg/dL (ref 8.6–10.4)
Chloride: 105 mmol/L (ref 98–110)
Creat: 1 mg/dL — ABNORMAL HIGH (ref 0.60–0.93)
Globulin: 2.5 g/dL (calc) (ref 1.9–3.7)
Glucose, Bld: 120 mg/dL — ABNORMAL HIGH (ref 65–99)
Potassium: 4.5 mmol/L (ref 3.5–5.3)
Sodium: 140 mmol/L (ref 135–146)
Total Bilirubin: 0.9 mg/dL (ref 0.2–1.2)
Total Protein: 6.7 g/dL (ref 6.1–8.1)

## 2019-09-03 LAB — HEMOGLOBIN A1C
Hgb A1c MFr Bld: 5.1 % of total Hgb (ref <5.7)
Mean Plasma Glucose: 100 (calc)
eAG (mmol/L): 5.5 (calc)

## 2019-09-03 LAB — TSH: TSH: 0.9 mIU/L (ref 0.40–4.50)

## 2019-09-07 ENCOUNTER — Other Ambulatory Visit: Payer: Self-pay | Admitting: Family Medicine

## 2019-09-07 DIAGNOSIS — Z1231 Encounter for screening mammogram for malignant neoplasm of breast: Secondary | ICD-10-CM

## 2019-09-07 DIAGNOSIS — N959 Unspecified menopausal and perimenopausal disorder: Secondary | ICD-10-CM

## 2019-09-28 ENCOUNTER — Other Ambulatory Visit: Payer: Self-pay

## 2019-09-28 ENCOUNTER — Ambulatory Visit: Payer: Self-pay

## 2019-09-28 ENCOUNTER — Encounter: Payer: Self-pay | Admitting: Family Medicine

## 2019-09-28 ENCOUNTER — Ambulatory Visit (INDEPENDENT_AMBULATORY_CARE_PROVIDER_SITE_OTHER): Payer: Medicare Other | Admitting: Family Medicine

## 2019-09-28 VITALS — BP 122/72 | HR 72 | Ht 68.0 in | Wt 189.0 lb

## 2019-09-28 DIAGNOSIS — G8929 Other chronic pain: Secondary | ICD-10-CM

## 2019-09-28 DIAGNOSIS — M7121 Synovial cyst of popliteal space [Baker], right knee: Secondary | ICD-10-CM | POA: Insufficient documentation

## 2019-09-28 DIAGNOSIS — M999 Biomechanical lesion, unspecified: Secondary | ICD-10-CM

## 2019-09-28 DIAGNOSIS — M545 Low back pain, unspecified: Secondary | ICD-10-CM

## 2019-09-28 DIAGNOSIS — M25562 Pain in left knee: Secondary | ICD-10-CM

## 2019-09-28 NOTE — Patient Instructions (Addendum)
Drained right knee, Baker's cyst today See me again in 7-8 weeks Knee compression sleeve daily for one week

## 2019-09-28 NOTE — Progress Notes (Signed)
/ [name] [place] Phone: [phone] Subjective:   Kimberly [name], am serving as a scribe for Dr. Hulan [name]. This visit occurred during the SARS-CoV-2 public health emergency.  Safety protocols were in place, including screening questions prior to the visit, additional usage of staff PPE, and extensive cleaning of exam room while observing appropriate contact time as indicated for disinfecting solutions.   I'm seeing this patient by the request  of:  Kimberly Flaming, MD  CC: Low back pain, knee pain follow-up  FBP:[id number]   08/10/2019 Improvement noted at this time.  Should do relatively well with conservative therapy and follow-up again in 6 weeks  Update 09/28/2019 Kimberly [name] is a 69 y.o. female coming in with complaint of low back and left hamstring pain and right knee, Baker's cyst. Notes swelling and pain in posterior aspect of knee. Using Voltaren.   OMT performed last visit. States that her back pain is dull. Pain is intermittent. She has been helping friends move which increased her back pain.       Past Medical History:  Diagnosis Date  . Meniere's disease, bilateral    Past Surgical History:  Procedure Laterality Date  . ABDOMINAL HYSTERECTOMY    . CHOLECYSTECTOMY    . EYE SURGERY    . THYROIDECTOMY, PARTIAL     Social History   Socioeconomic History  . Marital status: Single    Spouse name: Not on file  . Number of children: Not on file  . Years of education: Not on file  . Highest education level: Not on file  Occupational History  . Not on file  Tobacco Use  . Smoking status: Current Every Day Smoker    Types: Cigarettes  . Smokeless tobacco: Never Used  Vaping Use  . Vaping Use: Never used  Substance and Sexual Activity  . Alcohol use: Yes    Alcohol/week: 21.0 standard drinks    Types: 21 Glasses of wine per week    Comment: 3 glasses of wine per day  . Drug use: Never  .  Sexual activity: Not Currently  Other Topics Concern  . Not on file  Social History Narrative  . Not on file   Social Determinants of Health   Financial Resource Strain:   . Difficulty of Paying Living Expenses:   Food Insecurity:   . Worried About Charity fundraiser in the Last Year:   . Arboriculturist in the Last Year:   Transportation Needs:   . Film/video editor (Medical):   Marland Kitchen Lack of Transportation (Non-Medical):   Physical Activity:   . Days of Exercise per Week:   . Minutes of Exercise per Session:   Stress:   . Feeling of Stress :   Social Connections:   . Frequency of Communication with Friends and Family:   . Frequency of Social Gatherings with Friends and Family:   . Attends Religious Services:   . Active Member of Clubs or Organizations:   . Attends Archivist Meetings:   Marland Kitchen Marital Status:    Allergies  Allergen Reactions  . Erythromycin Diarrhea  . Clindamycin/Lincomycin Nausea And Vomiting  . Morphine And Related Itching   [name] family history on file.    Current Outpatient Medications (Respiratory):  .  cetirizine (ZYRTEC) 10 MG tablet, Take 10 mg by mouth daily.  Current Outpatient Medications (Analgesics):  .  aspirin EC 81 MG tablet, Take 81  mg by mouth daily.   Current Outpatient Medications (Other):  .  calcium-vitamin D (OSCAL WITH D) 250-125 MG-UNIT tablet, Take 1 tablet by mouth daily. .  Multiple Vitamins-Minerals (HAIR SKIN AND NAILS FORMULA PO), Take by mouth. .  Multiple Vitamins-Minerals (WOMENS MULTIVITAMIN PO), Take by mouth. .  vitamin E (VITAMIN E) 200 UNIT capsule, Take 200 Units by mouth daily.   Reviewed prior external information including notes and imaging from  primary care provider As well as notes that were available from care everywhere and other healthcare systems.  Past medical history, social, surgical and family history all reviewed in electronic medical record.  [name] unless stated  regarding to the chief complaint.   Review of Systems:  [name] headache, visual changes, nausea, vomiting, diarrhea, constipation, dizziness, abdominal pain, skin rash, fevers, chills, night sweats, weight loss, swollen lymph nodes, body aches, joint swelling, chest pain, shortness of breath, mood changes. POSITIVE muscle aches  Objective  Blood pressure 122/72, pulse 72, height 5\' 8"  (1.727 m), weight 189 lb (85.7 kg), SpO2 99 %.   General: [name] apparent distress alert and oriented x3 mood and affect normal, dressed appropriately.  HEENT: Pupils equal, extraocular movements intact  Respiratory: Patient's speak in full sentences and does not appear short of breath  Cardiovascular: [name] lower extremity edema, non tender, [name] erythema  Gait mild antalgic MSK:  Right knee exam does have arthritic changes noted.  Tender to palpation over the popliteal area with some fullness noted.  Lacks last 5 degrees of flexion.  Instability noted with valgus and varus force  Low back exam does have some loss of lordosis.  Tenderness to palpation diffusely.  Tightness with FABER test is more on the right than the left.  Procedure: Real-time Ultrasound Guided aspiration and injection of right knee Device: GE Logiq Q7 Ultrasound guided injection is preferred based studies that show increased duration, increased effect, greater accuracy, decreased procedural pain, increased response rate, and decreased cost with ultrasound guided versus blind injection.  Verbal informed consent obtained.  Time-out conducted.  Noted [name] overlying erythema, induration, or other signs of local infection.  Skin prepped in a sterile fashion.  Local anesthesia: Topical Ethyl chloride.  With sterile technique and under real time ultrasound guidance: With a 22-gauge 2 inch needle patient was injected with 4 cc of 0.5% Marcaine and removed 35 cc of straw-colored colored fluid 1 cc of Kenalog 40 mg/dL. This was from a posterior approach.    Completed without difficulty  Pain immediately resolved suggesting accurate placement of the medication.  Advised to call if fevers/chills, erythema, induration, drainage, or persistent bleeding.  Images permanently stored and available for review in the ultrasound unit.  Impression: Technically successful ultrasound guided injection.  Osteopathic findings T6 extended rotated and side bent right L2 flexed rotated and side bent right Sacrum right on right    Impression and Recommendations:     The above documentation has been reviewed and is accurate and complete Lyndal Pulley, DO       Note: This dictation was prepared with Dragon dictation along with smaller phrase technology. Any transcriptional errors that result from this process are unintentional.

## 2019-09-28 NOTE — Assessment & Plan Note (Signed)
Aspiration done today.  Under ultrasound guidance.  Patient responded well, discussed compression, discussed prognosis and likely recurrence over the course of numerous months.  Patient wants to avoid any type of other home exercises, icing regimen, which activities to doing which wants to avoid were discussed as well.  Follow-up again with 4 to 8 weeks.

## 2019-09-28 NOTE — Assessment & Plan Note (Signed)

## 2019-09-28 NOTE — Assessment & Plan Note (Signed)
Patient responding to light manipulation.  Discussed muscle energy techniques.  Discussed core strengthening.  Patient continue the vitamin supplementations and follow-up again in 4 to 8 weeks

## 2019-10-01 ENCOUNTER — Encounter: Payer: Self-pay | Admitting: Family Medicine

## 2019-10-01 ENCOUNTER — Ambulatory Visit (INDEPENDENT_AMBULATORY_CARE_PROVIDER_SITE_OTHER): Payer: Medicare Other | Admitting: Family Medicine

## 2019-10-01 ENCOUNTER — Ambulatory Visit (INDEPENDENT_AMBULATORY_CARE_PROVIDER_SITE_OTHER): Payer: Medicare Other

## 2019-10-01 ENCOUNTER — Other Ambulatory Visit: Payer: Self-pay

## 2019-10-01 VITALS — BP 140/86 | HR 60 | Ht 68.0 in | Wt 189.0 lb

## 2019-10-01 DIAGNOSIS — M545 Low back pain, unspecified: Secondary | ICD-10-CM

## 2019-10-01 DIAGNOSIS — G8929 Other chronic pain: Secondary | ICD-10-CM

## 2019-10-01 MED ORDER — METHYLPREDNISOLONE ACETATE 40 MG/ML IJ SUSP
40.0000 mg | Freq: Once | INTRAMUSCULAR | Status: AC
  Administered 2019-10-01: 40 mg via INTRAMUSCULAR

## 2019-10-01 MED ORDER — KETOROLAC TROMETHAMINE 30 MG/ML IJ SOLN
30.0000 mg | Freq: Once | INTRAMUSCULAR | Status: AC
  Administered 2019-10-01: 30 mg via INTRAMUSCULAR

## 2019-10-01 MED ORDER — GABAPENTIN 100 MG PO CAPS: 200.0000 mg | ORAL_CAPSULE | Freq: Every day | ORAL | 3 refills | Status: DC

## 2019-10-01 NOTE — Progress Notes (Signed)
[place] [address] [place] Phone: [phone] Subjective:   I Kimberly [name] am serving as a Education administrator for Dr. Hulan Saas.  This visit occurred during the SARS-CoV-2 public health emergency.  Safety protocols were in place, including screening questions prior to the visit, additional usage of staff PPE, and extensive cleaning of exam room while observing appropriate contact time as indicated for disinfecting solutions.   I'm seeing this patient by the request  of:  Orma Flaming, MD  CC: Low back pain follow-up  OFB:PZWCHENIDP   09/28/2019 Patient responding to light manipulation.  Discussed muscle energy techniques.  Discussed core strengthening.  Patient continue the vitamin supplementations and follow-up again in 4 to 8 weeks  Aspiration done today.  Under ultrasound guidance.  Patient responded well, discussed compression, discussed prognosis and likely recurrence over the course of numerous months.  Patient wants to avoid any type of other home exercises, icing regimen, which activities to doing which wants to avoid were discussed as well.  Follow-up again with 4 to 8 weeks.  10/01/2019 Kimberly [name] is a 69 y.o. female coming in with complaint of right sided sciatic pain. Yesterday she states she was changing her laundry out when she felt pain. Some numbness and tingling in her toes. Left leg is also painful. States she is scared to bend over due to pain. States she feels a knot in her glut as well. Legs feel weak. Left calf feeling tight.   Wearing knee sleeve on right knee. Not sure if the knee is getting better.   Onset- Yesterday Location - right sided sciatic, lower back  Duration-  Character- sore  Aggravating factors-  Reliving factors- standing upright  Therapies tried- ice, aleve Severity- 15/10      Past Medical History:  Diagnosis Date  . Meniere's disease, bilateral    Past Surgical History:  Procedure  Laterality Date  . ABDOMINAL HYSTERECTOMY    . CHOLECYSTECTOMY    . EYE SURGERY    . THYROIDECTOMY, PARTIAL     Social History   Socioeconomic History  . Marital status: Single    Spouse name: Not on file  . Number of children: Not on file  . Years of education: Not on file  . Highest education level: Not on file  Occupational History  . Not on file  Tobacco Use  . Smoking status: Current Every Day Smoker    Types: Cigarettes  . Smokeless tobacco: Never Used  Vaping Use  . Vaping Use: Never used  Substance and Sexual Activity  . Alcohol use: Yes    Alcohol/week: 21.0 standard drinks    Types: 21 Glasses of wine per week    Comment: 3 glasses of wine per day  . Drug use: Never  . Sexual activity: Not Currently  Other Topics Concern  . Not on file  Social History Narrative  . Not on file   Social Determinants of Health   Financial Resource Strain:   . Difficulty of Paying Living Expenses: Not on file  Food Insecurity:   . Worried About Charity fundraiser in the Last Year: Not on file  . Ran Out of Food in the Last Year: Not on file  Transportation Needs:   . Lack of Transportation (Medical): Not on file  . Lack of Transportation (Non-Medical): Not on file  Physical Activity:   . Days of Exercise per Week: Not on file  . Minutes of Exercise per Session:  Not on file  Stress:   . Feeling of Stress : Not on file  Social Connections:   . Frequency of Communication with Friends and Family: Not on file  . Frequency of Social Gatherings with Friends and Family: Not on file  . Attends Religious Services: Not on file  . Active Member of Clubs or Organizations: Not on file  . Attends Archivist Meetings: Not on file  . Marital Status: Not on file   Allergies  Allergen Reactions  . Erythromycin Diarrhea  . Clindamycin/Lincomycin Nausea And Vomiting  . Morphine And Related Itching   No family history on file.    Current Outpatient Medications  (Respiratory):  .  cetirizine (ZYRTEC) 10 MG tablet, Take 10 mg by mouth daily.  Current Outpatient Medications (Analgesics):  .  aspirin EC 81 MG tablet, Take 81 mg by mouth daily.   Current Outpatient Medications (Other):  .  calcium-vitamin D (OSCAL WITH D) 250-125 MG-UNIT tablet, Take 1 tablet by mouth daily. .  Multiple Vitamins-Minerals (HAIR SKIN AND NAILS FORMULA PO), Take by mouth. .  Multiple Vitamins-Minerals (WOMENS MULTIVITAMIN PO), Take by mouth. .  vitamin E (VITAMIN E) 200 UNIT capsule, Take 200 Units by mouth daily. Marland Kitchen  gabapentin (NEURONTIN) 100 MG capsule, Take 2 capsules (200 mg total) by mouth at bedtime.   Reviewed prior external information including notes and imaging from  primary care provider As well as notes that were available from care everywhere and other healthcare systems.  Past medical history, social, surgical and family history all reviewed in electronic medical record.  No pertanent information unless stated regarding to the chief complaint.   Review of Systems:  No headache, visual changes, nausea, vomiting, diarrhea, constipation, dizziness, abdominal pain, skin rash, fevers, chills, night sweats, weight loss, swollen lymph nodes, body aches, joint swelling, chest pain, shortness of breath, mood changes. POSITIVE muscle aches  Objective  Blood pressure 140/86, pulse 60, height 5\' 8"  (1.727 m), weight 189 lb (85.7 kg), SpO2 98 %.   General: No apparent distress alert and oriented x3 mood and affect normal, dressed appropriately.  HEENT: Pupils equal, extraocular movements intact  Respiratory: Patient's speak in full sentences and does not appear short of breath  Cardiovascular: No lower extremity edema, non tender, no erythema  Neuro: Cranial nerves II through XII are intact, neurovascularly intact in all extremities with 2+ DTRs and 2+ pulses.  Gait normal with good balance and coordination.  MSK: Arthritic changes of multiple joints. Patient  back does have loss of lordosis.  Unable to do more flexion than 5 degrees secondary to the amount of pain.  Mild radicular symptoms down the right leg even at 5 degrees of forward flexion no weakness the patient does not want to sit to do significant amount of testing at the moment.  Neurovascularly intact distally..     Impression and Recommendations:     The above documentation has been reviewed and is accurate and complete Lyndal Pulley, DO       Note: This dictation was prepared with Dragon dictation along with smaller phrase technology. Any transcriptional errors that result from this process are unintentional.

## 2019-10-01 NOTE — Assessment & Plan Note (Signed)
I do believe that patient is having an exacerbation with spinal stenosis.  Started on gabapentin.  Toradol and Depo-Medrol given today.  Discussed x-rays which were ordered today.  Worsening symptoms I would like the patient to consider the MRI.  The moment we will start with physical therapy.  Follow-up again in 5 to 6 weeks

## 2019-10-01 NOTE — Patient Instructions (Addendum)
Good to see you xrays today Gabapentin 200 mg at night PT will call you See me again in 5-6 weeks if things worsen let me know we will get MRI

## 2019-10-13 ENCOUNTER — Other Ambulatory Visit: Payer: Self-pay

## 2019-10-13 ENCOUNTER — Ambulatory Visit (INDEPENDENT_AMBULATORY_CARE_PROVIDER_SITE_OTHER): Payer: Medicare Other | Admitting: Physical Therapy

## 2019-10-13 ENCOUNTER — Encounter: Payer: Self-pay | Admitting: Physical Therapy

## 2019-10-13 DIAGNOSIS — G8929 Other chronic pain: Secondary | ICD-10-CM | POA: Diagnosis not present

## 2019-10-13 DIAGNOSIS — M545 Low back pain: Secondary | ICD-10-CM | POA: Diagnosis not present

## 2019-10-13 NOTE — Therapy (Signed)
[facility] [address] [place], [place], [address] Phone: [phone]   Fax:  [phone]  Physical Therapy Evaluation  Patient Details  Name: Kimberly [name] MRN: [id number] Date of Birth: 1950/08/11 Referring Provider (PT): [name]   Encounter Date: 10/13/2019   PT End of Session - 10/13/19 2154    Visit Number 1    Number of Visits 12    Date for PT Re-Evaluation 11/24/19    Authorization Type Medicare    PT Start Time 0930    PT Stop Time 3151    PT Time Calculation (min) 44 min    Activity Tolerance Patient tolerated treatment well    Behavior During Therapy [facility] for tasks assessed/performed           Past Medical History:  Diagnosis Date  . Meniere's disease, bilateral     Past Surgical History:  Procedure Laterality Date  . ABDOMINAL HYSTERECTOMY    . CHOLECYSTECTOMY    . EYE SURGERY    . THYROIDECTOMY, PARTIAL      There were no vitals filed for this visit.    Subjective Assessment - 10/13/19 0942    Subjective Chronic back pain. Was seeing chiro for years for neck and back. Has been less active for last year, now trying to be more active, and having increased pain. Most pain in R low back/SI. Having Leg cramping. Slight tingling into Bil thighs. Pt is retired.  Does not regularly Exercise    Pertinent History L scoliosis,    Limitations Lifting;Standing;Walking;House hold activities    Patient Stated Goals decreased pain    Currently in Pain? Yes    Pain Score 6     Pain Location Back    Pain Orientation Right    Pain Descriptors / Indicators Aching    Pain Type Chronic pain    Pain Radiating Towards into R hip/glute    Pain Onset More than a month ago    Pain Frequency Intermittent              [facility] PT Assessment - 10/13/19 0001      Assessment   Medical Diagnosis Low Back pain, knee pain    Referring Provider (PT) [name]    Prior Therapy no      Balance Screen   Has the patient fallen  in the past 6 months No      Prior Function   Level of Independence Independent      Cognition   Overall Cognitive Status Within Functional Limits for tasks assessed      Posture/Postural Control   Posture Comments Scoliosis,  L shoulder lower, L Le longer in supine      ROM / Strength   AROM / PROM / Strength AROM;Strength      AROM   AROM Assessment Site Lumbar    Lumbar Flexion mild limitation    Lumbar Extension mod limitation    Lumbar - Right Side Bend Mod limitation/ pain    Lumbar - Left Side Bend mod limitation/ pain       Strength   Overall Strength Comments Hips: 4-/5, Knees: 4/5 , Core: 3/5       Palpation   Palpation comment Pain in Bil R>L low lumbar region, central and in paraspinals,; Pain in R glute med and piriformis       Special Tests   Other special tests Neg SLR  Objective measurements completed on examination: See above findings.       [facility] Adult PT Treatment/Exercise - 10/13/19 0001      Exercises   Exercises Lumbar      Lumbar Exercises: Stretches   Active Hamstring Stretch 3 reps;30 seconds    Piriformis Stretch 3 reps;30 seconds    Other Lumbar Stretch Exercise Hip IR stretch 30 sec x 2 on R;       Manual Therapy   Manual Therapy Joint mobilization;Soft tissue mobilization;Passive ROM    Soft tissue mobilization STM to R glute in S/L     Passive ROM PRom R hip                  PT Education - 10/13/19 2154    Education Details PT POC, Exam findings, HEP    Person(s) Educated Patient    Methods Explanation;Demonstration;Tactile cues;Verbal cues;Handout    Comprehension Verbalized understanding;Returned demonstration;Verbal cues required;Tactile cues required;Need further instruction            PT Short Term Goals - 10/13/19 2157      PT SHORT TERM GOAL #1   Title Pt to be independent with initial HEP    Time 2    Period Weeks    Status New    Target Date 10/27/19              PT Long Term Goals - 10/13/19 2201      PT LONG TERM GOAL #1   Title Pt to be independent with final HEP    Time 6    Period Weeks    Status New    Target Date 11/24/19      PT LONG TERM GOAL #2   Title Pt to report decreased pain in lumbar region to 0-2/10 with activity    Time 6    Period Weeks    Status New    Target Date 11/24/19      PT LONG TERM GOAL #3   Title Pt to demo improved lumbar ROM to be [facility] and pain free, to improve ability for ADLs.    Time 6    Period Weeks    Status New    Target Date 11/24/19      PT LONG TERM GOAL #4   Title Pt to demo improved strength of Bil hips and knees to at least 4+/5 to improve pain and stability    Time 6    Period Weeks    Status New    Target Date 11/24/19                  Plan - 10/13/19 2207    Clinical Impression Statement Pt presents with primary complaint of ongoing increased pain in lumbar spine. Pt with decreased ROM of lumbar spine, and mild/mod fear of movement. She has decreased posture, with L scoliosis. She has decreased strenth of LEs, and core, and is overall deconditioned, with lack of effective HEP. She has most pain in R lumbar region into R glute. Also has bil knee pain. Pt with decreased ability for full functional activities, and will benefit from skilled PT.    Personal Factors and Comorbidities Fitness;Time since onset of injury/illness/exacerbation;Comorbidity 1    Comorbidities Scoliosis, B knee pain    Examination-Activity Limitations Locomotion Level;Bend;Lift;Stand;Stairs    Examination-Participation Restrictions Cleaning;Meal Prep;Yard Work;Community Activity;Laundry;Shop    Stability/Clinical Decision Making Evolving/Moderate complexity    Clinical Decision Making Moderate    Rehab Potential Good  PT Frequency 2x / week    PT Duration 6 weeks    PT Treatment/Interventions ADLs/Self Care Home Management;Cryotherapy;Electrical Stimulation;Ultrasound;Traction;Moist Heat;Iontophoresis  4mg /ml Dexamethasone;Gait training;Stair training;Functional mobility training;Therapeutic activities;Therapeutic exercise;Balance training;Orthotic Fit/Training;Patient/family education;Neuromuscular re-education;Manual techniques;Passive range of motion;Dry needling;Vasopneumatic Device;Joint Manipulations;Spinal Manipulations    Consulted and Agree with Plan of Care Patient           Patient will benefit from skilled therapeutic intervention in order to improve the following deficits and impairments:  Abnormal gait, Pain, Improper body mechanics, Postural dysfunction, Increased muscle spasms, Decreased mobility, Decreased activity tolerance, Decreased range of motion, Decreased strength, Impaired flexibility  Visit Diagnosis: Chronic right-sided low back pain without sciatica     Problem List Patient Active Problem List   Diagnosis Date Noted  . Baker's cyst of knee, right 09/28/2019  . Hyperlipidemia, mixed 09/01/2019  . Nonallopathic lesion of lumbar region 08/10/2019  . Nonallopathic lesion of thoracic region 08/10/2019  . Nonallopathic lesion of sacral region 08/10/2019  . Partial tear of right hamstring 07/13/2019  . Meniere's disease of both ears 10/05/2018  . Osteopenia 10/05/2018  . Low back pain potentially associated with spinal stenosis 10/05/2018    Lyndee Hensen, PT, DPT 10:22 PM  10/13/19    [facility] [place], [place], [address] Phone: [phone]   Fax:  [phone]  Name: Kimberly [name] MRN: [id number] Date of Birth: [birth year]

## 2019-10-13 NOTE — Patient Instructions (Signed)
Access Code: [email or web address] URL: [email or web address] Date: 10/13/2019 Prepared by: Lyndee Hensen  Exercises Supine Single Knee to Chest Stretch - 2 x daily - 3 reps - 30 hold Supine Figure 4 Piriformis Stretch with Leg Extension - 2 x daily - 3 reps - 30 hold Supine Piriformis Stretch with Leg Straight - 2 x daily - 3 reps - 30 hold

## 2019-10-22 ENCOUNTER — Ambulatory Visit (INDEPENDENT_AMBULATORY_CARE_PROVIDER_SITE_OTHER): Payer: Medicare Other | Admitting: Physical Therapy

## 2019-10-22 ENCOUNTER — Encounter: Payer: Self-pay | Admitting: Physical Therapy

## 2019-10-22 ENCOUNTER — Other Ambulatory Visit: Payer: Self-pay

## 2019-10-22 DIAGNOSIS — G8929 Other chronic pain: Secondary | ICD-10-CM

## 2019-10-22 DIAGNOSIS — M545 Low back pain, unspecified: Secondary | ICD-10-CM

## 2019-10-22 NOTE — Therapy (Signed)
[facility] [address] [place], [place], [address] Phone: [phone]   Fax:  [phone]  Physical Therapy Treatment  Patient Details  Name: Kimberly [name] MRN: [id number] Date of Birth: 29-Nov-1950 Referring Provider (PT): [name]   Encounter Date: 10/22/2019   PT End of Session - 10/22/19 1134    Visit Number 2    Number of Visits 12    Date for PT Re-Evaluation 11/24/19    Authorization Type Medicare    PT Start Time 0845    PT Stop Time 0930    PT Time Calculation (min) 45 min    Activity Tolerance Patient tolerated treatment well    Behavior During Therapy [facility] for tasks assessed/performed           Past Medical History:  Diagnosis Date  . Meniere's disease, bilateral     Past Surgical History:  Procedure Laterality Date  . ABDOMINAL HYSTERECTOMY    . CHOLECYSTECTOMY    . EYE SURGERY    . THYROIDECTOMY, PARTIAL      There were no vitals filed for this visit.   Subjective Assessment - 10/22/19 0855    Subjective Pt states some decreased soreness after DTM in glute, but is still sore. Knees have been sore with stretches. Pt would like to review HEp    Pertinent History L scoliosis,    Limitations Lifting;Standing;Walking;House hold activities    Patient Stated Goals decreased pain    Currently in Pain? Yes    Pain Score 5     Pain Location Back    Pain Orientation Right    Pain Descriptors / Indicators Aching    Pain Type Chronic pain    Pain Onset More than a month ago    Pain Frequency Intermittent    Multiple Pain Sites Yes    Pain Score 3    Pain Location Knee    Pain Orientation Right;Left    Pain Descriptors / Indicators Aching    Pain Type Chronic pain    Pain Onset More than a month ago    Pain Frequency Intermittent                             [facility] Adult PT Treatment/Exercise - 10/22/19 0001      Posture/Postural Control   Posture Comments Scoliosis,  L shoulder lower, L  Le longer in supine      Exercises   Exercises Lumbar      Lumbar Exercises: Stretches   Active Hamstring Stretch --    Single Knee to Chest Stretch 2 reps;30 seconds    Piriformis Stretch 3 reps;30 seconds    Gastroc Stretch 2 reps;30 seconds    Gastroc Stretch Limitations at wall    Other Lumbar Stretch Exercise --      Lumbar Exercises: Aerobic   Recumbent Bike L1 x 8 min       Lumbar Exercises: Supine   Clam 20 reps    Clam Limitations RTB    Bent Knee Raise 20 reps    Bent Knee Raise Limitations RTB    Bridge 15 reps      Manual Therapy   Manual Therapy Joint mobilization;Soft tissue mobilization;Passive ROM    Soft tissue mobilization DTM/TPR to R glute in S/L     Passive ROM --                    PT Short  Term Goals - 10/13/19 2157      PT SHORT TERM GOAL #1   Title Pt to be independent with initial HEP    Time 2    Period Weeks    Status New    Target Date 10/27/19             PT Long Term Goals - 10/13/19 2201      PT LONG TERM GOAL #1   Title Pt to be independent with final HEP    Time 6    Period Weeks    Status New    Target Date 11/24/19      PT LONG TERM GOAL #2   Title Pt to report decreased pain in lumbar region to 0-2/10 with activity    Time 6    Period Weeks    Status New    Target Date 11/24/19      PT LONG TERM GOAL #3   Title Pt to demo improved lumbar ROM to be [facility] and pain free, to improve ability for ADLs.    Time 6    Period Weeks    Status New    Target Date 11/24/19      PT LONG TERM GOAL #4   Title Pt to demo improved strength of Bil hips and knees to at least 4+/5 to improve pain and stability    Time 6    Period Weeks    Status New    Target Date 11/24/19                 Plan - 10/22/19 1135    Clinical Impression Statement Pt with soreness and tightness in R glute today with DTM, will likley benefit from DN next sessoin. Req max cuing for ther ex, HEP reviewed in detail.    Personal Factors  and Comorbidities Fitness;Time since onset of injury/illness/exacerbation;Comorbidity 1    Comorbidities Scoliosis, B knee pain    Examination-Activity Limitations Locomotion Level;Bend;Lift;Stand;Stairs    Examination-Participation Restrictions Cleaning;Meal Prep;Yard Work;Community Activity;Laundry;Shop    Stability/Clinical Decision Making Evolving/Moderate complexity    Rehab Potential Good    PT Frequency 2x / week    PT Duration 6 weeks    PT Treatment/Interventions ADLs/Self Care Home Management;Cryotherapy;Electrical Stimulation;Ultrasound;Traction;Moist Heat;Iontophoresis 4mg /ml Dexamethasone;Gait training;Stair training;Functional mobility training;Therapeutic activities;Therapeutic exercise;Balance training;Orthotic Fit/Training;Patient/family education;Neuromuscular re-education;Manual techniques;Passive range of motion;Dry needling;Vasopneumatic Device;Joint Manipulations;Spinal Manipulations    Consulted and Agree with Plan of Care Patient           Patient will benefit from skilled therapeutic intervention in order to improve the following deficits and impairments:  Abnormal gait, Pain, Improper body mechanics, Postural dysfunction, Increased muscle spasms, Decreased mobility, Decreased activity tolerance, Decreased range of motion, Decreased strength, Impaired flexibility  Visit Diagnosis: Chronic right-sided low back pain without sciatica     Problem List Patient Active Problem List   Diagnosis Date Noted  . Baker's cyst of knee, right 09/28/2019  . Hyperlipidemia, mixed 09/01/2019  . Nonallopathic lesion of lumbar region 08/10/2019  . Nonallopathic lesion of thoracic region 08/10/2019  . Nonallopathic lesion of sacral region 08/10/2019  . Partial tear of right hamstring 07/13/2019  . Meniere's disease of both ears 10/05/2018  . Osteopenia 10/05/2018  . Low back pain potentially associated with spinal stenosis 10/05/2018    Lyndee Hensen, PT, DPT 11:38 AM   10/22/19    [facility] [place], [place], [address] Phone: [phone]   Fax:  [phone]  Name:  Kimberly [name] MRN: [id number] Date of Birth: 11-29-1950

## 2019-10-26 ENCOUNTER — Encounter: Payer: Self-pay | Admitting: Physical Therapy

## 2019-10-26 ENCOUNTER — Ambulatory Visit (INDEPENDENT_AMBULATORY_CARE_PROVIDER_SITE_OTHER): Payer: Medicare Other | Admitting: Physical Therapy

## 2019-10-26 ENCOUNTER — Other Ambulatory Visit: Payer: Self-pay

## 2019-10-26 DIAGNOSIS — G8929 Other chronic pain: Secondary | ICD-10-CM

## 2019-10-26 DIAGNOSIS — M545 Low back pain: Secondary | ICD-10-CM | POA: Diagnosis not present

## 2019-10-26 NOTE — Therapy (Signed)
[facility] [address] [place], [place], [address] Phone: [phone]   Fax:  [phone]  Physical Therapy Treatment  Patient Details  Name: Kimberly [name] MRN: [id number] Date of Birth: 11-19-1950 Referring Provider (PT): [name]   Encounter Date: 10/26/2019   PT End of Session - 10/26/19 0931    Visit Number 3    Number of Visits 12    Date for PT Re-Evaluation 11/24/19    Authorization Type Medicare    PT Start Time 0845    PT Stop Time 0930    PT Time Calculation (min) 45 min    Activity Tolerance Patient tolerated treatment well    Behavior During Therapy [facility] for tasks assessed/performed           Past Medical History:  Diagnosis Date  . Meniere's disease, bilateral     Past Surgical History:  Procedure Laterality Date  . ABDOMINAL HYSTERECTOMY    . CHOLECYSTECTOMY    . EYE SURGERY    . THYROIDECTOMY, PARTIAL      There were no vitals filed for this visit.   Subjective Assessment - 10/26/19 0903    Subjective Pt states she feels like she has a bakers cyst in L LE. She is somewhat hesitant for exercise with this. States R glute still quite tender    Currently in Pain? Yes    Pain Score 5     Pain Location Back    Pain Orientation Right    Pain Descriptors / Indicators Aching    Pain Type Chronic pain    Pain Onset More than a month ago    Pain Frequency Intermittent                             [facility] Adult PT Treatment/Exercise - 10/26/19 0001      Posture/Postural Control   Posture Comments Scoliosis,  L shoulder lower, L Le longer in supine      Exercises   Exercises Lumbar      Lumbar Exercises: Stretches   Active Hamstring Stretch 3 reps;30 seconds    Single Knee to Chest Stretch --    Piriformis Stretch 3 reps;30 seconds    Gastroc Stretch 2 reps;30 seconds    Gastroc Stretch Limitations at wall      Lumbar Exercises: Aerobic   Recumbent Bike L1 x 8 min       Lumbar  Exercises: Seated   Long Arc Quad on Chair 20 reps      Lumbar Exercises: Supine   Clam 20 reps    Clam Limitations RTB    Bent Knee Raise --    Bent Knee Raise Limitations --    Bridge --    Straight Leg Raise 10 reps      Manual Therapy   Manual Therapy Joint mobilization;Soft tissue mobilization;Passive ROM    Soft tissue mobilization DTM/TPR to R glute in S/L                     PT Short Term Goals - 10/13/19 2157      PT SHORT TERM GOAL #1   Title Pt to be independent with initial HEP    Time 2    Period Weeks    Status New    Target Date 10/27/19             PT Long Term Goals - 10/13/19 2201  PT LONG TERM GOAL #1   Title Pt to be independent with final HEP    Time 6    Period Weeks    Status New    Target Date 11/24/19      PT LONG TERM GOAL #2   Title Pt to report decreased pain in lumbar region to 0-2/10 with activity    Time 6    Period Weeks    Status New    Target Date 11/24/19      PT LONG TERM GOAL #3   Title Pt to demo improved lumbar ROM to be [facility] and pain free, to improve ability for ADLs.    Time 6    Period Weeks    Status New    Target Date 11/24/19      PT LONG TERM GOAL #4   Title Pt to demo improved strength of Bil hips and knees to at least 4+/5 to improve pain and stability    Time 6    Period Weeks    Status New    Target Date 11/24/19                 Plan - 10/26/19 1214    Clinical Impression Statement Pt with soreness in R glute with DTM, less from previous visits. Continued education on ther ex benefits for strengthening of hips and knees. HEP updated. Pt req. max cuing for all ther ex.    Personal Factors and Comorbidities Fitness;Time since onset of injury/illness/exacerbation;Comorbidity 1    Comorbidities Scoliosis, B knee pain    Examination-Activity Limitations Locomotion Level;Bend;Lift;Stand;Stairs    Examination-Participation Restrictions Cleaning;Meal Prep;Yard Work;Community  Activity;Laundry;Shop    Stability/Clinical Decision Making Evolving/Moderate complexity    Rehab Potential Good    PT Frequency 2x / week    PT Duration 6 weeks    PT Treatment/Interventions ADLs/Self Care Home Management;Cryotherapy;Electrical Stimulation;Ultrasound;Traction;Moist Heat;Iontophoresis 4mg /ml Dexamethasone;Gait training;Stair training;Functional mobility training;Therapeutic activities;Therapeutic exercise;Balance training;Orthotic Fit/Training;Patient/family education;Neuromuscular re-education;Manual techniques;Passive range of motion;Dry needling;Vasopneumatic Device;Joint Manipulations;Spinal Manipulations    Consulted and Agree with Plan of Care Patient           Patient will benefit from skilled therapeutic intervention in order to improve the following deficits and impairments:  Abnormal gait, Pain, Improper body mechanics, Postural dysfunction, Increased muscle spasms, Decreased mobility, Decreased activity tolerance, Decreased range of motion, Decreased strength, Impaired flexibility  Visit Diagnosis: Chronic right-sided low back pain without sciatica     Problem List Patient Active Problem List   Diagnosis Date Noted  . Baker's cyst of knee, right 09/28/2019  . Hyperlipidemia, mixed 09/01/2019  . Nonallopathic lesion of lumbar region 08/10/2019  . Nonallopathic lesion of thoracic region 08/10/2019  . Nonallopathic lesion of sacral region 08/10/2019  . Partial tear of right hamstring 07/13/2019  . Meniere's disease of both ears 10/05/2018  . Osteopenia 10/05/2018  . Low back pain potentially associated with spinal stenosis 10/05/2018    Lyndee Hensen, PT, DPT 12:15 PM  10/26/19    [place], [place], [address] Phone: [phone]   Fax:  [phone]  Name: Kimberly [name] MRN: [id number] Date of Birth: [birth year]

## 2019-10-28 ENCOUNTER — Encounter: Payer: Self-pay | Admitting: Physical Therapy

## 2019-10-28 ENCOUNTER — Other Ambulatory Visit: Payer: Self-pay

## 2019-10-28 ENCOUNTER — Ambulatory Visit (INDEPENDENT_AMBULATORY_CARE_PROVIDER_SITE_OTHER): Payer: Medicare Other | Admitting: Physical Therapy

## 2019-10-28 DIAGNOSIS — M545 Low back pain: Secondary | ICD-10-CM

## 2019-10-28 DIAGNOSIS — G8929 Other chronic pain: Secondary | ICD-10-CM

## 2019-10-28 NOTE — Therapy (Signed)
[facility] [address] [place], [place], [address] Phone: [phone]   Fax:  [phone]  Physical Therapy Treatment  Patient Details  Name: Kimberly [name] MRN: [id number] Date of Birth: 07-10-1950 Referring Provider (PT): [name]   Encounter Date: 10/28/2019   PT End of Session - 10/28/19 0941    Visit Number 4    Number of Visits 12    Date for PT Re-Evaluation 11/24/19    Authorization Type Medicare    PT Start Time 0848    PT Stop Time 0940    PT Time Calculation (min) 52 min    Activity Tolerance Patient tolerated treatment well    Behavior During Therapy [facility] for tasks assessed/performed           Past Medical History:  Diagnosis Date   Meniere's disease, bilateral     Past Surgical History:  Procedure Laterality Date   ABDOMINAL HYSTERECTOMY     CHOLECYSTECTOMY     EYE SURGERY     THYROIDECTOMY, PARTIAL      There were no vitals filed for this visit.   Subjective Assessment - 10/28/19 0940    Subjective Pt states relief from last session DTM in glute, but a bit sore this am.    Currently in Pain? Yes    Pain Score 3     Pain Location Hip    Pain Orientation Right    Pain Descriptors / Indicators Aching    Pain Type Chronic pain    Pain Onset More than a month ago    Pain Frequency Intermittent                             [facility] Adult PT Treatment/Exercise - 10/28/19 0856      Posture/Postural Control   Posture Comments Scoliosis,  L shoulder lower, L Le longer in supine      Exercises   Exercises Lumbar      Lumbar Exercises: Stretches   Active Hamstring Stretch 3 reps;30 seconds    Single Knee to Chest Stretch 2 reps;30 seconds    Piriformis Stretch 3 reps;30 seconds    Gastroc Stretch --    Gastroc Stretch Limitations --    Other Lumbar Stretch Exercise QL stretch at wall 30 sec x 3 bil;       Lumbar Exercises: Aerobic   Recumbent Bike L1 x 8 min       Lumbar  Exercises: Standing   Row 20 reps    Theraband Level (Row) Level 3 (Green)      Lumbar Exercises: Seated   Long Arc Quad on Chair --      Lumbar Exercises: Supine   Clam --    Clam Limitations --    Straight Leg Raise --      Modalities   Modalities Moist Heat      Moist Heat Therapy   Number Minutes Moist Heat 10 Minutes    Moist Heat Location Hip      Manual Therapy   Manual Therapy Joint mobilization;Soft tissue mobilization;Passive ROM    Manual therapy comments skilled palpation and monitoring of soft tissue with dry needling     Soft tissue mobilization DTM/TPR to R glute in S/L             Trigger Point Dry Needling - 10/28/19 0001    Consent Given? Yes    Education Handout Provided Yes  Muscles Treated Back/Hip Gluteus minimus;Gluteus medius;Piriformis    Gluteus Minimus Response Palpable increased muscle length    Gluteus Medius Response Palpable increased muscle length    Piriformis Response Palpable increased muscle length                  PT Short Term Goals - 10/13/19 2157      PT SHORT TERM GOAL #1   Title Pt to be independent with initial HEP    Time 2    Period Weeks    Status New    Target Date 10/27/19             PT Long Term Goals - 10/13/19 2201      PT LONG TERM GOAL #1   Title Pt to be independent with final HEP    Time 6    Period Weeks    Status New    Target Date 11/24/19      PT LONG TERM GOAL #2   Title Pt to report decreased pain in lumbar region to 0-2/10 with activity    Time 6    Period Weeks    Status New    Target Date 11/24/19      PT LONG TERM GOAL #3   Title Pt to demo improved lumbar ROM to be [facility] and pain free, to improve ability for ADLs.    Time 6    Period Weeks    Status New    Target Date 11/24/19      PT LONG TERM GOAL #4   Title Pt to demo improved strength of Bil hips and knees to at least 4+/5 to improve pain and stability    Time 6    Period Weeks    Status New    Target Date  11/24/19                 Plan - 10/28/19 0942    Clinical Impression Statement Pt with tenderness and trigger points in R glute, addressed with DN and DTM today. HEP updated. Reviewed best stretches for glute pain.    Personal Factors and Comorbidities Fitness;Time since onset of injury/illness/exacerbation;Comorbidity 1    Comorbidities Scoliosis, B knee pain    Examination-Activity Limitations Locomotion Level;Bend;Lift;Stand;Stairs    Examination-Participation Restrictions Cleaning;Meal Prep;Yard Work;Community Activity;Laundry;Shop    Stability/Clinical Decision Making Evolving/Moderate complexity    Rehab Potential Good    PT Frequency 2x / week    PT Duration 6 weeks    PT Treatment/Interventions ADLs/Self Care Home Management;Cryotherapy;Electrical Stimulation;Ultrasound;Traction;Moist Heat;Iontophoresis 4mg /ml Dexamethasone;Gait training;Stair training;Functional mobility training;Therapeutic activities;Therapeutic exercise;Balance training;Orthotic Fit/Training;Patient/family education;Neuromuscular re-education;Manual techniques;Passive range of motion;Dry needling;Vasopneumatic Device;Joint Manipulations;Spinal Manipulations    Consulted and Agree with Plan of Care Patient           Patient will benefit from skilled therapeutic intervention in order to improve the following deficits and impairments:  Abnormal gait, Pain, Improper body mechanics, Postural dysfunction, Increased muscle spasms, Decreased mobility, Decreased activity tolerance, Decreased range of motion, Decreased strength, Impaired flexibility  Visit Diagnosis: Chronic right-sided low back pain without sciatica     Problem List Patient Active Problem List   Diagnosis Date Noted   Baker's cyst of knee, right 09/28/2019   Hyperlipidemia, mixed 09/01/2019   Nonallopathic lesion of lumbar region 08/10/2019   Nonallopathic lesion of thoracic region 08/10/2019   Nonallopathic lesion of sacral region  08/10/2019   Partial tear of right hamstring 07/13/2019   Meniere's disease of both ears 10/05/2018  Osteopenia 10/05/2018   Low back pain potentially associated with spinal stenosis 10/05/2018    Kimberly [name], PT, DPT 9:43 AM  10/28/19    [facility] [place], [place], [address] Phone: [phone]   Fax:  [phone]  Name: Kimberly [name] MRN: [id number] Date of Birth: 02/18/1950

## 2019-11-01 ENCOUNTER — Ambulatory Visit (INDEPENDENT_AMBULATORY_CARE_PROVIDER_SITE_OTHER): Payer: Medicare Other | Admitting: Gastroenterology

## 2019-11-01 ENCOUNTER — Other Ambulatory Visit (INDEPENDENT_AMBULATORY_CARE_PROVIDER_SITE_OTHER): Payer: Medicare Other

## 2019-11-01 ENCOUNTER — Encounter: Payer: Self-pay | Admitting: Gastroenterology

## 2019-11-01 VITALS — BP 130/72 | HR 84 | Ht 68.0 in | Wt 188.0 lb

## 2019-11-01 DIAGNOSIS — R152 Fecal urgency: Secondary | ICD-10-CM | POA: Diagnosis not present

## 2019-11-01 DIAGNOSIS — R197 Diarrhea, unspecified: Secondary | ICD-10-CM

## 2019-11-01 DIAGNOSIS — R159 Full incontinence of feces: Secondary | ICD-10-CM | POA: Diagnosis not present

## 2019-11-01 LAB — IGA: IgA: 267 mg/dL (ref 68–378)

## 2019-11-01 MED ORDER — NA SULFATE-K SULFATE-MG SULF 17.5-3.13-1.6 GM/177ML PO SOLN: 1.0000 | Freq: Once | ORAL | 0 refills | Status: AC

## 2019-11-01 NOTE — Patient Instructions (Signed)
Your provider has requested that you go to the basement level for lab work before leaving today. Press "B" on the elevator. The lab is located at the first door on the left as you exit the elevator.  Due to recent changes in healthcare laws, you may see the results of your imaging and laboratory studies on MyChart before your provider has had a chance to review them.  We understand that in some cases there may be results that are confusing or concerning to you. Not all laboratory results come back in the same time frame and the provider may be waiting for multiple results in order to interpret others.  Please give [place] 48 hours in order for your provider to thoroughly review all the results before contacting the office for clarification of your results.   You can take over the counter Imodium twice daily for diarrhea.   You have been scheduled for a colonoscopy. Please follow written instructions given to you at your visit today.  Please pick up your prep supplies at the pharmacy within the next 1-3 days. If you use inhalers (even only as needed), please bring them with you on the day of your procedure.   Thank you for choosing me and [facility].  Pricilla Riffle. Dagoberto Ligas., MD., Marval Regal

## 2019-11-01 NOTE — Progress Notes (Signed)
History of Present Illness: This is a 69 year old female referred by Orma Flaming, MD for the evaluation of diarrhea, fecal urgency with incontinence.  She relates 1-2 loose stools per day for about the past year.  Previously her stools were well formed.  She has frequent postprandial fecal urgency and often notes small amounts of liquid after a bowel movement.  She lives with her identical twin sister.  She relates her sister has mild IBS.  Patient states she underwent colonoscopy in [place] in 2013 that showed diverticulosis and was otherwise normal.  She does not have the records and she does not recall the physician's name. Denies weight loss, abdominal pain, constipation, change in stool caliber, melena, hematochezia, nausea, vomiting, dysphagia, reflux symptoms, chest pain.    Allergies  Allergen Reactions  . Erythromycin Diarrhea  . Clindamycin/Lincomycin Nausea And Vomiting  . Morphine And Related Itching   Outpatient Medications Prior to Visit  Medication Sig Dispense Refill  . aspirin EC 81 MG tablet Take 81 mg by mouth daily.    . Calcium Carbonate-Vit D-Min (CALCIUM 1200 PO) Take by mouth daily.    . cetirizine (ZYRTEC) 10 MG tablet Take 10 mg by mouth daily.    Marland Kitchen gabapentin (NEURONTIN) 100 MG capsule Take 2 capsules (200 mg total) by mouth at bedtime. 180 capsule 3  . Multiple Vitamins-Minerals (HAIR SKIN AND NAILS FORMULA PO) Take by mouth.    . Multiple Vitamins-Minerals (WOMENS MULTIVITAMIN PO) Take by mouth.    . vitamin E (VITAMIN E) 200 UNIT capsule Take 200 Units by mouth daily.    . calcium-vitamin D (OSCAL WITH D) 250-125 MG-UNIT tablet Take 1 tablet by mouth daily.     No facility-administered medications prior to visit.   Past Medical History:  Diagnosis Date  . Diverticulosis   . Meniere's disease, bilateral    Past Surgical History:  Procedure Laterality Date  . ABDOMINAL HYSTERECTOMY    . CHOLECYSTECTOMY    . EYE SURGERY    . THYROIDECTOMY, PARTIAL      Social History   Socioeconomic History  . Marital status: Single    Spouse name: Not on file  . Number of children: Not on file  . Years of education: Not on file  . Highest education level: Not on file  Occupational History  . Occupation: Marine scientist  Tobacco Use  . Smoking status: Current Every Day Smoker    Types: Cigarettes  . Smokeless tobacco: Never Used  Vaping Use  . Vaping Use: Never used  Substance and Sexual Activity  . Alcohol use: Yes    Alcohol/week: 21.0 standard drinks    Types: 21 Glasses of wine per week    Comment: 3 glasses of wine per day  . Drug use: Never  . Sexual activity: Not Currently  Other Topics Concern  . Not on file  Social History Narrative  . Not on file   Social Determinants of Health   Financial Resource Strain:   . Difficulty of Paying Living Expenses: Not on file  Food Insecurity:   . Worried About Charity fundraiser in the Last Year: Not on file  . Ran Out of Food in the Last Year: Not on file  Transportation Needs:   . Lack of Transportation (Medical): Not on file  . Lack of Transportation (Non-Medical): Not on file  Physical Activity:   . Days of Exercise per Week: Not on file  . Minutes of Exercise per Session: Not  on file  Stress:   . Feeling of Stress : Not on file  Social Connections:   . Frequency of Communication with Friends and Family: Not on file  . Frequency of Social Gatherings with Friends and Family: Not on file  . Attends Religious Services: Not on file  . Active Member of Clubs or Organizations: Not on file  . Attends Archivist Meetings: Not on file  . Marital Status: Not on file   Family History  Problem Relation Age of Onset  . Colon polyps Mother   . Heart disease Mother   . Heart disease Father   . Stomach cancer Paternal Grandfather       Review of Systems: Pertinent positive and negative review of systems were noted in the above HPI section. All other review of systems were  otherwise negative.   Physical Exam: General: Well developed, well nourished, no acute distress Head: Normocephalic and atraumatic Eyes:  sclerae anicteric, EOMI Ears: Normal auditory acuity Mouth: Not examined, mask on during Covid-19 pandemic Neck: Supple, no masses or thyromegaly Lungs: Clear throughout to auscultation Heart: Regular rate and rhythm; no murmurs, rubs or bruits Abdomen: Soft, non tender and non distended. No masses, hepatosplenomegaly or hernias noted. Normal Bowel sounds Rectal: Deferred to colonoscopy  Musculoskeletal: Symmetrical with no gross deformities  Skin: No lesions on visible extremities Pulses:  Normal pulses noted Extremities: No clubbing, cyanosis, edema or deformities noted Neurological: Alert oriented x 4, grossly nonfocal Cervical Nodes:  No significant cervical adenopathy Inguinal Nodes: No significant inguinal adenopathy Psychological:  Alert and cooperative. Normal mood and affect   Assessment and Recommendations:  1. Diarrhea. Fecal incontinence with urgency.  Rule out microscopic colitis, IBD, celiac disease, pancreatic insufficiency, infection.  Stool GI profile, tTG, IgA, fecal elastase.  Imodium twice daily as needed.  Schedule colonoscopy. The risks (including bleeding, perforation, infection, missed lesions, medication reactions and possible hospitalization or surgery if complications occur), benefits, and alternatives to colonoscopy with possible biopsy and possible polypectomy were discussed with the patient and they consent to proceed.     cc: Orma Flaming, MD [address] [place],  [place] [address]

## 2019-11-02 ENCOUNTER — Encounter: Payer: Self-pay | Admitting: Physical Therapy

## 2019-11-02 ENCOUNTER — Other Ambulatory Visit: Payer: Self-pay

## 2019-11-02 ENCOUNTER — Ambulatory Visit (INDEPENDENT_AMBULATORY_CARE_PROVIDER_SITE_OTHER): Payer: Medicare Other | Admitting: Physical Therapy

## 2019-11-02 DIAGNOSIS — G8929 Other chronic pain: Secondary | ICD-10-CM

## 2019-11-02 DIAGNOSIS — M545 Low back pain: Secondary | ICD-10-CM | POA: Diagnosis not present

## 2019-11-02 LAB — TISSUE TRANSGLUTAMINASE, IGA: (tTG) Ab, IgA: <1 U/mL

## 2019-11-02 NOTE — Therapy (Signed)
[facility] [address] [place], [place], [address] Phone: [phone]   Fax:  [phone]  Physical Therapy Treatment  Patient Details  Name: Kimberly [name] MRN: [id number] Date of Birth: Jul 20, 1950 Referring Provider (PT): [name]   Encounter Date: 11/02/2019   PT End of Session - 11/02/19 0939    Visit Number 5    Number of Visits 12    Date for PT Re-Evaluation 11/24/19    Authorization Type Medicare    PT Start Time 0933    PT Stop Time 1013    PT Time Calculation (min) 40 min    Activity Tolerance Patient tolerated treatment well    Behavior During Therapy [facility] for tasks assessed/performed           Past Medical History:  Diagnosis Date   Diverticulosis    Meniere's disease, bilateral     Past Surgical History:  Procedure Laterality Date   ABDOMINAL HYSTERECTOMY     CHOLECYSTECTOMY     EYE SURGERY     THYROIDECTOMY, PARTIAL      There were no vitals filed for this visit.   Subjective Assessment - 11/02/19 0937    Subjective Pt states knees are sore, helped a friend move over the weekend. States less soreness in glute, but still sore.    Currently in Pain? Yes    Pain Score 2     Pain Location Hip    Pain Orientation Right    Pain Descriptors / Indicators Aching    Pain Type Chronic pain    Pain Onset More than a month ago    Pain Frequency Intermittent                             [facility] Adult PT Treatment/Exercise - 11/02/19 0940      Posture/Postural Control   Posture Comments Scoliosis,  L shoulder lower, L Le longer in supine      Exercises   Exercises Lumbar      Lumbar Exercises: Stretches   Active Hamstring Stretch 3 reps;30 seconds    Single Knee to Chest Stretch 2 reps;30 seconds    Piriformis Stretch 3 reps;30 seconds    Gastroc Stretch 2 reps;30 seconds    Gastroc Stretch Limitations at wall    Other Lumbar Stretch Exercise QL stretch at wall 30 sec x 3 bil;      Other Lumbar Stretch Exercise HIp IR across body 30 sec x 3;       Lumbar Exercises: Aerobic   Recumbent Bike L1 x 8 min       Lumbar Exercises: Standing   Row 20 reps    Theraband Level (Row) Level 3 (Green)    Other Standing Lumbar Exercises standing hip abd, march , both x20 bil;       Lumbar Exercises: Sidelying   Hip Abduction 15 reps      Modalities   Modalities Moist Heat      Moist Heat Therapy   Moist Heat Location --      Manual Therapy   Manual Therapy Joint mobilization;Soft tissue mobilization;Passive ROM;Manual Traction    Manual therapy comments --    Joint Mobilization inf hip mobs gr 3     Soft tissue mobilization STM / roller  to R glute in S/L     Passive ROM manual priformis stretch     Manual Traction long leg distraction for R lumbar pump  x 3 min                     PT Short Term Goals - 10/13/19 2157      PT SHORT TERM GOAL #1   Title Pt to be independent with initial HEP    Time 2    Period Weeks    Status New    Target Date 10/27/19             PT Long Term Goals - 10/13/19 2201      PT LONG TERM GOAL #1   Title Pt to be independent with final HEP    Time 6    Period Weeks    Status New    Target Date 11/24/19      PT LONG TERM GOAL #2   Title Pt to report decreased pain in lumbar region to 0-2/10 with activity    Time 6    Period Weeks    Status New    Target Date 11/24/19      PT LONG TERM GOAL #3   Title Pt to demo improved lumbar ROM to be [facility] and pain free, to improve ability for ADLs.    Time 6    Period Weeks    Status New    Target Date 11/24/19      PT LONG TERM GOAL #4   Title Pt to demo improved strength of Bil hips and knees to at least 4+/5 to improve pain and stability    Time 6    Period Weeks    Status New    Target Date 11/24/19                 Plan - 11/02/19 1625    Clinical Impression Statement Pt with less soreness in glute with STM/roller today. Progressive strengthening done ,  pt req max cuing for ther ex. Plant to progress as able.    Personal Factors and Comorbidities Fitness;Time since onset of injury/illness/exacerbation;Comorbidity 1    Comorbidities Scoliosis, B knee pain    Examination-Activity Limitations Locomotion Level;Bend;Lift;Stand;Stairs    Examination-Participation Restrictions Cleaning;Meal Prep;Yard Work;Community Activity;Laundry;Shop    Stability/Clinical Decision Making Evolving/Moderate complexity    Rehab Potential Good    PT Frequency 2x / week    PT Duration 6 weeks    PT Treatment/Interventions ADLs/Self Care Home Management;Cryotherapy;Electrical Stimulation;Ultrasound;Traction;Moist Heat;Iontophoresis 4mg /ml Dexamethasone;Gait training;Stair training;Functional mobility training;Therapeutic activities;Therapeutic exercise;Balance training;Orthotic Fit/Training;Patient/family education;Neuromuscular re-education;Manual techniques;Passive range of motion;Dry needling;Vasopneumatic Device;Joint Manipulations;Spinal Manipulations    Consulted and Agree with Plan of Care Patient           Patient will benefit from skilled therapeutic intervention in order to improve the following deficits and impairments:  Abnormal gait, Pain, Improper body mechanics, Postural dysfunction, Increased muscle spasms, Decreased mobility, Decreased activity tolerance, Decreased range of motion, Decreased strength, Impaired flexibility  Visit Diagnosis: Chronic right-sided low back pain without sciatica     Problem List Patient Active Problem List   Diagnosis Date Noted   Baker's cyst of knee, right 09/28/2019   Hyperlipidemia, mixed 09/01/2019   Nonallopathic lesion of lumbar region 08/10/2019   Nonallopathic lesion of thoracic region 08/10/2019   Nonallopathic lesion of sacral region 08/10/2019   Partial tear of right hamstring 07/13/2019   Meniere's disease of both ears 10/05/2018   Osteopenia 10/05/2018   Low back pain potentially associated  with spinal stenosis 10/05/2018   Lyndee Hensen, PT, DPT 4:28 PM  11/02/19    [facility]  [place], [place], [address] Phone: [phone]   Fax:  [phone]  Name: Paije Goodhart MRN: [id number] Date of Birth: 28-Jun-1950

## 2019-11-04 ENCOUNTER — Encounter: Payer: Federal, State, Local not specified - PPO | Admitting: Physical Therapy

## 2019-11-04 ENCOUNTER — Ambulatory Visit (INDEPENDENT_AMBULATORY_CARE_PROVIDER_SITE_OTHER): Payer: Medicare Other | Admitting: Physical Therapy

## 2019-11-04 ENCOUNTER — Encounter: Payer: Self-pay | Admitting: Physical Therapy

## 2019-11-04 ENCOUNTER — Other Ambulatory Visit: Payer: Federal, State, Local not specified - PPO

## 2019-11-04 ENCOUNTER — Other Ambulatory Visit: Payer: Self-pay

## 2019-11-04 ENCOUNTER — Other Ambulatory Visit: Payer: Medicare Other

## 2019-11-04 DIAGNOSIS — R197 Diarrhea, unspecified: Secondary | ICD-10-CM

## 2019-11-04 DIAGNOSIS — G8929 Other chronic pain: Secondary | ICD-10-CM

## 2019-11-04 DIAGNOSIS — M545 Low back pain, unspecified: Secondary | ICD-10-CM

## 2019-11-04 DIAGNOSIS — R159 Full incontinence of feces: Secondary | ICD-10-CM

## 2019-11-04 NOTE — Therapy (Signed)
[facility] [address] [place], [place], [address] Phone: [phone]   Fax:  [phone]  Physical Therapy Treatment  Patient Details  Name: Kimberly [name] MRN: [id number] Date of Birth: 1951-01-06 Referring Provider (PT): [name]   Encounter Date: 11/04/2019   PT End of Session - 11/04/19 1428    Visit Number 6    Number of Visits 12    Date for PT Re-Evaluation 11/24/19    Authorization Type Medicare    PT Start Time 1350    PT Stop Time 1430    PT Time Calculation (min) 40 min    Activity Tolerance Patient tolerated treatment well    Behavior During Therapy [facility] for tasks assessed/performed           Past Medical History:  Diagnosis Date  . Diverticulosis   . Meniere's disease, bilateral     Past Surgical History:  Procedure Laterality Date  . ABDOMINAL HYSTERECTOMY    . CHOLECYSTECTOMY    . EYE SURGERY    . THYROIDECTOMY, PARTIAL      There were no vitals filed for this visit.   Subjective Assessment - 11/04/19 1427    Subjective Pt states less pain in glute/hip in last few days.    Currently in Pain? Yes    Pain Score 3     Pain Location Hip    Pain Orientation Right    Pain Descriptors / Indicators Aching    Pain Type Chronic pain    Pain Onset More than a month ago    Pain Frequency Intermittent                             [facility] Adult PT Treatment/Exercise - 11/04/19 1412      Posture/Postural Control   Posture Comments Scoliosis,  L shoulder lower, L Le longer in supine      Exercises   Exercises Lumbar      Lumbar Exercises: Stretches   Active Hamstring Stretch --    Single Knee to Chest Stretch 2 reps;30 seconds    Piriformis Stretch --    Gastroc Stretch 2 reps;30 seconds    Gastroc Stretch Limitations at wall    Other Lumbar Stretch Exercise --    Other Lumbar Stretch Exercise HIp IR across body 30 sec x 3;       Lumbar Exercises: Aerobic   Recumbent Bike --       Lumbar Exercises: Standing   Row 20 reps    Theraband Level (Row) Level 3 (Green)    Other Standing Lumbar Exercises standing hip abd, march , both x20 bil;       Lumbar Exercises: Supine   Clam 20 reps    Straight Leg Raise 10 reps    Straight Leg Raises Limitations bil      Lumbar Exercises: Sidelying   Hip Abduction 20 reps      Modalities   Modalities Moist Heat      Manual Therapy   Manual Therapy Joint mobilization;Soft tissue mobilization;Passive ROM;Manual Traction    Joint Mobilization --    Soft tissue mobilization STM, DTM, IASTm and roller  to R glute and hip in S/L     Passive ROM --    Manual Traction --                  PT Education - 11/04/19 1428    Education Details HEP reviewed  Person(s) Educated Patient    Methods Explanation;Demonstration;Verbal cues;Handout    Comprehension Verbalized understanding;Returned demonstration;Verbal cues required;Need further instruction            PT Short Term Goals - 10/13/19 2157      PT SHORT TERM GOAL #1   Title Pt to be independent with initial HEP    Time 2    Period Weeks    Status New    Target Date 10/27/19             PT Long Term Goals - 10/13/19 2201      PT LONG TERM GOAL #1   Title Pt to be independent with final HEP    Time 6    Period Weeks    Status New    Target Date 11/24/19      PT LONG TERM GOAL #2   Title Pt to report decreased pain in lumbar region to 0-2/10 with activity    Time 6    Period Weeks    Status New    Target Date 11/24/19      PT LONG TERM GOAL #3   Title Pt to demo improved lumbar ROM to be [facility] and pain free, to improve ability for ADLs.    Time 6    Period Weeks    Status New    Target Date 11/24/19      PT LONG TERM GOAL #4   Title Pt to demo improved strength of Bil hips and knees to at least 4+/5 to improve pain and stability    Time 6    Period Weeks    Status New    Target Date 11/24/19                 Plan - 11/04/19 1429     Clinical Impression Statement Pt with soreness in glute with palpation and DTM, but less pain than previously, and less pain overall with activity. Pt reports improvments. Is hesitant for increased activity and strengthening, but reports doing HEP. HEP reviewed today. Pt will benefit from continued care.    Personal Factors and Comorbidities Fitness;Time since onset of injury/illness/exacerbation;Comorbidity 1    Comorbidities Scoliosis, B knee pain    Examination-Activity Limitations Locomotion Level;Bend;Lift;Stand;Stairs    Examination-Participation Restrictions Cleaning;Meal Prep;Yard Work;Community Activity;Laundry;Shop    Stability/Clinical Decision Making Evolving/Moderate complexity    Rehab Potential Good    PT Frequency 2x / week    PT Duration 6 weeks    PT Treatment/Interventions ADLs/Self Care Home Management;Cryotherapy;Electrical Stimulation;Ultrasound;Traction;Moist Heat;Iontophoresis 4mg /ml Dexamethasone;Gait training;Stair training;Functional mobility training;Therapeutic activities;Therapeutic exercise;Balance training;Orthotic Fit/Training;Patient/family education;Neuromuscular re-education;Manual techniques;Passive range of motion;Dry needling;Vasopneumatic Device;Joint Manipulations;Spinal Manipulations    Consulted and Agree with Plan of Care Patient           Patient will benefit from skilled therapeutic intervention in order to improve the following deficits and impairments:  Abnormal gait, Pain, Improper body mechanics, Postural dysfunction, Increased muscle spasms, Decreased mobility, Decreased activity tolerance, Decreased range of motion, Decreased strength, Impaired flexibility  Visit Diagnosis: Chronic right-sided low back pain without sciatica     Problem List Patient Active Problem List   Diagnosis Date Noted  . Baker's cyst of knee, right 09/28/2019  . Hyperlipidemia, mixed 09/01/2019  . Nonallopathic lesion of lumbar region 08/10/2019  .  Nonallopathic lesion of thoracic region 08/10/2019  . Nonallopathic lesion of sacral region 08/10/2019  . Partial tear of right hamstring 07/13/2019  . Meniere's disease of both ears 10/05/2018  . Osteopenia  10/05/2018  . Low back pain potentially associated with spinal stenosis 10/05/2018    Lyndee Hensen, PT, DPT 2:32 PM  11/04/19    [facility] [place], [place], [address] Phone: [phone]   Fax:  [phone]  Name: Kimberly [name] MRN: [id number] Date of Birth: 01/25/1951

## 2019-11-09 ENCOUNTER — Telehealth: Payer: Self-pay | Admitting: Gastroenterology

## 2019-11-09 ENCOUNTER — Encounter: Payer: Federal, State, Local not specified - PPO | Admitting: Physical Therapy

## 2019-11-09 ENCOUNTER — Ambulatory Visit: Payer: Federal, State, Local not specified - PPO | Admitting: Family Medicine

## 2019-11-09 LAB — GI PROFILE, STOOL, PCR

## 2019-11-09 NOTE — Telephone Encounter (Signed)
See lab results for details.  

## 2019-11-09 NOTE — Telephone Encounter (Signed)
Pt is requesting a call back from a nurse to discuss lab results. 

## 2019-11-10 LAB — PANCREATIC ELASTASE, FECAL: Pancreatic Elastase-1, Stool: 423 mcg/g

## 2019-11-11 ENCOUNTER — Encounter: Payer: Federal, State, Local not specified - PPO | Admitting: Physical Therapy

## 2019-11-16 ENCOUNTER — Other Ambulatory Visit: Payer: Self-pay

## 2019-11-16 ENCOUNTER — Ambulatory Visit (INDEPENDENT_AMBULATORY_CARE_PROVIDER_SITE_OTHER): Payer: Medicare Other | Admitting: Physical Therapy

## 2019-11-16 ENCOUNTER — Encounter: Payer: Self-pay | Admitting: Physical Therapy

## 2019-11-16 DIAGNOSIS — M545 Low back pain, unspecified: Secondary | ICD-10-CM

## 2019-11-16 DIAGNOSIS — G8929 Other chronic pain: Secondary | ICD-10-CM

## 2019-11-17 NOTE — Therapy (Signed)
[facility] [address] [place], [place], [address] Phone: [phone]   Fax:  [phone]  Physical Therapy Treatment  Patient Details  Name: Kimberly [name] MRN: [id number] Date of Birth: [birth year] Referring Provider (PT): [name]   Encounter Date: 11/16/2019   PT End of Session - 11/16/19 0849    Visit Number 7    Number of Visits 12    Date for PT Re-Evaluation 11/24/19    Authorization Type Medicare    PT Start Time 0841    PT Stop Time 0925    PT Time Calculation (min) 44 min    Activity Tolerance Patient tolerated treatment well    Behavior During Therapy [facility] for tasks assessed/performed           Past Medical History:  Diagnosis Date  . Diverticulosis   . Meniere's disease, bilateral     Past Surgical History:  Procedure Laterality Date  . ABDOMINAL HYSTERECTOMY    . CHOLECYSTECTOMY    . EYE SURGERY    . THYROIDECTOMY, PARTIAL      There were no vitals filed for this visit.   Subjective Assessment - 11/16/19 0843    Subjective Pt states less pain in hip. Still has days with increased pain. Knees have been sore    Currently in Pain? Yes    Pain Score 3     Pain Location Hip    Pain Orientation Right    Pain Descriptors / Indicators Aching    Pain Type Chronic pain    Pain Onset More than a month ago    Pain Frequency Intermittent                             [facility] Adult PT Treatment/Exercise - 11/16/19 0850      Posture/Postural Control   Posture Comments Scoliosis,  L shoulder lower, L Le longer in supine      Exercises   Exercises Lumbar      Lumbar Exercises: Stretches   Single Knee to Chest Stretch 2 reps;30 seconds    Gastroc Stretch 2 reps;30 seconds    Gastroc Stretch Limitations at wall    Other Lumbar Stretch Exercise Standing QL/SB stretch 30 sec x 3;       Lumbar Exercises: Aerobic   Recumbent Bike L1 x 9 min       Lumbar Exercises: Standing   Row --     Theraband Level (Row) --    Other Standing Lumbar Exercises standing hip abd, march , HR, both x20 bil;       Lumbar Exercises: Supine   Clam 20 reps    Clam Limitations GTB    Bridge 20 reps    Straight Leg Raise 20 reps    Straight Leg Raises Limitations bil      Lumbar Exercises: Sidelying   Hip Abduction 20 reps      Modalities   Modalities Moist Heat      Manual Therapy   Manual Therapy Joint mobilization;Soft tissue mobilization;Passive ROM;Manual Traction    Soft tissue mobilization --                    PT Short Term Goals - 10/13/19 2157      PT SHORT TERM GOAL #1   Title Pt to be independent with initial HEP    Time 2    Period Weeks    Status New  Target Date 10/27/19             PT Long Term Goals - 10/13/19 2201      PT LONG TERM GOAL #1   Title Pt to be independent with final HEP    Time 6    Period Weeks    Status New    Target Date 11/24/19      PT LONG TERM GOAL #2   Title Pt to report decreased pain in lumbar region to 0-2/10 with activity    Time 6    Period Weeks    Status New    Target Date 11/24/19      PT LONG TERM GOAL #3   Title Pt to demo improved lumbar ROM to be [facility] and pain free, to improve ability for ADLs.    Time 6    Period Weeks    Status New    Target Date 11/24/19      PT LONG TERM GOAL #4   Title Pt to demo improved strength of Bil hips and knees to at least 4+/5 to improve pain and stability    Time 6    Period Weeks    Status New    Target Date 11/24/19                 Plan - 11/17/19 0916    Clinical Impression Statement Pt with improving ability for ther ex and strengthening. Pt comlaints of pain in posterior knees from bakers cysts. She reports less pain in glute region, but still has bothersome soreness with getting up from seated position. Pt to benefit from continued care.    Personal Factors and Comorbidities Fitness;Time since onset of injury/illness/exacerbation;Comorbidity 1     Comorbidities Scoliosis, B knee pain    Examination-Activity Limitations Locomotion Level;Bend;Lift;Stand;Stairs    Examination-Participation Restrictions Cleaning;Meal Prep;Yard Work;Community Activity;Laundry;Shop    Stability/Clinical Decision Making Evolving/Moderate complexity    Rehab Potential Good    PT Frequency 2x / week    PT Duration 6 weeks    PT Treatment/Interventions ADLs/Self Care Home Management;Cryotherapy;Electrical Stimulation;Ultrasound;Traction;Moist Heat;Iontophoresis 4mg /ml Dexamethasone;Gait training;Stair training;Functional mobility training;Therapeutic activities;Therapeutic exercise;Balance training;Orthotic Fit/Training;Patient/family education;Neuromuscular re-education;Manual techniques;Passive range of motion;Dry needling;Vasopneumatic Device;Joint Manipulations;Spinal Manipulations    Consulted and Agree with Plan of Care Patient           Patient will benefit from skilled therapeutic intervention in order to improve the following deficits and impairments:  Abnormal gait, Pain, Improper body mechanics, Postural dysfunction, Increased muscle spasms, Decreased mobility, Decreased activity tolerance, Decreased range of motion, Decreased strength, Impaired flexibility  Visit Diagnosis: Chronic right-sided low back pain without sciatica     Problem List Patient Active Problem List   Diagnosis Date Noted  . Baker's cyst of knee, right 09/28/2019  . Hyperlipidemia, mixed 09/01/2019  . Nonallopathic lesion of lumbar region 08/10/2019  . Nonallopathic lesion of thoracic region 08/10/2019  . Nonallopathic lesion of sacral region 08/10/2019  . Partial tear of right hamstring 07/13/2019  . Meniere's disease of both ears 10/05/2018  . Osteopenia 10/05/2018  . Low back pain potentially associated with spinal stenosis 10/05/2018   Lyndee Hensen, PT, DPT 9:20 AM  11/17/19    [facility] [place],  [place], [address] Phone: [phone]   Fax:  [phone]  Name: Kieu Quiggle MRN: [id number] Date of Birth: 06-Aug-1950

## 2019-11-18 ENCOUNTER — Encounter: Payer: Self-pay | Admitting: Physical Therapy

## 2019-11-18 ENCOUNTER — Ambulatory Visit (INDEPENDENT_AMBULATORY_CARE_PROVIDER_SITE_OTHER): Payer: Medicare Other | Admitting: Physical Therapy

## 2019-11-18 ENCOUNTER — Other Ambulatory Visit: Payer: Self-pay

## 2019-11-18 DIAGNOSIS — M545 Low back pain, unspecified: Secondary | ICD-10-CM

## 2019-11-18 DIAGNOSIS — G8929 Other chronic pain: Secondary | ICD-10-CM | POA: Diagnosis not present

## 2019-11-18 NOTE — Therapy (Signed)
[facility] [address] [place], [place], [address] Phone: [phone]   Fax:  [phone]  Physical Therapy Treatment/ Re-Cert   Patient Details  Name: Kimberly [name] MRN: [id number] Date of Birth: 1950/03/09 Referring Provider (PT): [name]   Encounter Date: 11/18/2019   PT End of Session - 11/18/19 0858    Visit Number 8    Number of Visits 20    Date for PT Re-Evaluation 12/30/19    Authorization Type Medicare    PN done on visit 8    PT Start Time 0845    PT Stop Time 0925    PT Time Calculation (min) 40 min    Activity Tolerance Patient tolerated treatment well    Behavior During Therapy [facility] for tasks assessed/performed           Past Medical History:  Diagnosis Date  . Diverticulosis   . Meniere's disease, bilateral     Past Surgical History:  Procedure Laterality Date  . ABDOMINAL HYSTERECTOMY    . CHOLECYSTECTOMY    . EYE SURGERY    . THYROIDECTOMY, PARTIAL      There were no vitals filed for this visit.   Subjective Assessment - 11/18/19 0855    Subjective Pt states less soreness.    Currently in Pain? Yes    Pain Score 1     Pain Location Hip    Pain Orientation Right    Pain Descriptors / Indicators Aching    Pain Type Chronic pain    Pain Onset More than a month ago    Pain Frequency Intermittent              [facility] PT Assessment - 11/18/19 0001      AROM   Lumbar Flexion mild limitation    Lumbar Extension mild  limitation    Lumbar - Right Side Bend Mild limitation    Lumbar - Left Side Bend mild limitatio       Strength   Overall Strength Comments Hips: 4/5, Knees: 4+/5 , Core: 3+/5       Special Tests   Other special tests Neg SLR                         [facility] Adult PT Treatment/Exercise - 11/18/19 0001      Posture/Postural Control   Posture Comments Scoliosis,  L shoulder lower, L Le longer in supine      Exercises   Exercises Lumbar      Lumbar Exercises:  Stretches   Single Knee to Chest Stretch 2 reps;30 seconds    Gastroc Stretch 2 reps;30 seconds    Gastroc Stretch Limitations at wall    Other Lumbar Stretch Exercise Standing QL/SB stretch 30 sec x 3;       Lumbar Exercises: Aerobic   Recumbent Bike L1 x 8 min       Lumbar Exercises: Standing   Row 20 reps    Theraband Level (Row) Level 3 (Green)    Other Standing Lumbar Exercises standing hip abd, march ,20 bil;       Lumbar Exercises: Supine   Clam --    Clam Limitations --    Bridge 20 reps    Straight Leg Raise 20 reps    Straight Leg Raises Limitations bil      Lumbar Exercises: Sidelying   Hip Abduction 20 reps      Modalities   Modalities Moist Heat  Manual Therapy   Manual Therapy Joint mobilization;Soft tissue mobilization;Passive ROM;Manual Traction    Soft tissue mobilization STM, DTM, IASTm R glute and hip in S/L                   PT Education - 11/18/19 1154    Education Details HEP reviewed    Person(s) Educated Patient    Methods Explanation;Demonstration;Verbal cues;Handout    Comprehension Verbalized understanding;Returned demonstration;Verbal cues required;Need further instruction            PT Short Term Goals - 11/18/19 1154      PT SHORT TERM GOAL #1   Title Pt to be independent with initial HEP    Time 2    Period Weeks    Status Achieved    Target Date 10/27/19             PT Long Term Goals - 11/18/19 1154      PT LONG TERM GOAL #1   Title Pt to be independent with final HEP    Time 6    Period Weeks    Status Partially Met      PT LONG TERM GOAL #2   Title Pt to report decreased pain in lumbar region to 0-2/10 with activity    Time 6    Period Weeks    Status Partially Met      PT LONG TERM GOAL #3   Title Pt to demo improved lumbar ROM to be [facility] and pain free, to improve ability for ADLs.    Time 6    Period Weeks    Status Partially Met      PT LONG TERM GOAL #4   Title Pt to demo improved strength  of Bil hips and knees to at least 4+/5 to improve pain and stability    Time 6    Period Weeks    Status Partially Met                 Plan - 11/18/19 1155    Clinical Impression Statement Pt with improving pain, less intense and less frequent. She has less pain with mobility, and less pain with palpation of glute. She has improving strength in hips and LEs. Pt progressing well, will benefit from continued care, for pain relief, strengthening, and to meet LTGs.    Personal Factors and Comorbidities Fitness;Time since onset of injury/illness/exacerbation;Comorbidity 1    Comorbidities Scoliosis, B knee pain    Examination-Activity Limitations Locomotion Level;Bend;Lift;Stand;Stairs    Examination-Participation Restrictions Cleaning;Meal Prep;Yard Work;Community Activity;Laundry;Shop    Stability/Clinical Decision Making Evolving/Moderate complexity    Rehab Potential Good    PT Frequency 2x / week    PT Duration 6 weeks    PT Treatment/Interventions ADLs/Self Care Home Management;Cryotherapy;Electrical Stimulation;Ultrasound;Traction;Moist Heat;Iontophoresis 24m/ml Dexamethasone;Gait training;Stair training;Functional mobility training;Therapeutic activities;Therapeutic exercise;Balance training;Orthotic Fit/Training;Patient/family education;Neuromuscular re-education;Manual techniques;Passive range of motion;Dry needling;Vasopneumatic Device;Joint Manipulations;Spinal Manipulations    Consulted and Agree with Plan of Care Patient           Patient will benefit from skilled therapeutic intervention in order to improve the following deficits and impairments:  Abnormal gait, Pain, Improper body mechanics, Postural dysfunction, Increased muscle spasms, Decreased mobility, Decreased activity tolerance, Decreased range of motion, Decreased strength, Impaired flexibility  Visit Diagnosis: Chronic right-sided low back pain without sciatica     Problem List Patient Active Problem List    Diagnosis Date Noted  . Baker's cyst of knee, right 09/28/2019  . Hyperlipidemia, mixed 09/01/2019  .  Nonallopathic lesion of lumbar region 08/10/2019  . Nonallopathic lesion of thoracic region 08/10/2019  . Nonallopathic lesion of sacral region 08/10/2019  . Partial tear of right hamstring 07/13/2019  . Meniere's disease of both ears 10/05/2018  . Osteopenia 10/05/2018  . Low back pain potentially associated with spinal stenosis 10/05/2018   Lyndee Hensen, PT, DPT 11:57 AM  11/18/19    [facility] [place], [place], [address] Phone: [phone]   Fax:  [phone]  Name: Kimberly [name] MRN: [id number] Date of Birth: 04/10/1950

## 2019-11-19 ENCOUNTER — Ambulatory Visit (AMBULATORY_SURGERY_CENTER): Payer: Medicare Other | Admitting: Gastroenterology

## 2019-11-19 ENCOUNTER — Encounter: Payer: Self-pay | Admitting: Gastroenterology

## 2019-11-19 VITALS — BP 111/60 | HR 62 | Temp 97.5°F | Resp 21 | Ht 68.0 in | Wt 188.0 lb

## 2019-11-19 DIAGNOSIS — K5730 Diverticulosis of large intestine without perforation or abscess without bleeding: Secondary | ICD-10-CM | POA: Diagnosis not present

## 2019-11-19 DIAGNOSIS — R197 Diarrhea, unspecified: Secondary | ICD-10-CM

## 2019-11-19 DIAGNOSIS — D128 Benign neoplasm of rectum: Secondary | ICD-10-CM

## 2019-11-19 DIAGNOSIS — D125 Benign neoplasm of sigmoid colon: Secondary | ICD-10-CM | POA: Diagnosis not present

## 2019-11-19 DIAGNOSIS — D127 Benign neoplasm of rectosigmoid junction: Secondary | ICD-10-CM | POA: Diagnosis not present

## 2019-11-19 MED ORDER — SODIUM CHLORIDE 0.9 % IV SOLN: 500.0000 mL | Freq: Once | INTRAVENOUS | Status: DC

## 2019-11-19 NOTE — Op Note (Signed)
[facility] Patient Name: Kimberly [name] Procedure Date: 11/19/2019 7:55 AM MRN: [id number] Endoscopist: Ladene Artist , MD Age: 69 Referring MD:  Date of Birth: March 27, 1950 Gender: Female Account #: [id number] Procedure:                Colonoscopy Indications:              Clinically significant diarrhea of unexplained                            origin Medicines:                Monitored Anesthesia Care Procedure:                Pre-Anesthesia Assessment:                           - Prior to the procedure, a History and Physical                            was performed, and patient medications and                            allergies were reviewed. The patient's tolerance of                            previous anesthesia was also reviewed. The risks                            and benefits of the procedure and the sedation                            options and risks were discussed with the patient.                            All questions were answered, and informed consent                            was obtained. Prior Anticoagulants: The patient has                            taken no previous anticoagulant or antiplatelet                            agents. ASA Grade Assessment: II - A patient with                            mild systemic disease. After reviewing the risks                            and benefits, the patient was deemed in                            satisfactory condition to undergo the procedure.  After obtaining informed consent, the colonoscope                            was passed under direct vision. Throughout the                            procedure, the patient's blood pressure, pulse, and                            oxygen saturations were monitored continuously. The                            Colonoscope was introduced through the anus and                            advanced to the the terminal ileum, with                             identification of the appendiceal orifice and IC                            valve. The ileocecal valve, appendiceal orifice,                            and rectum were photographed. The quality of the                            bowel preparation was good. The colonoscopy was                            performed without difficulty. The patient tolerated                            the procedure well. Scope In: 8:67:61 AM Scope Out: 8:23:21 AM Scope Withdrawal Time: 0 hours 13 minutes 54 seconds  Total Procedure Duration: 0 hours 16 minutes 37 seconds  Findings:                 The perianal and digital rectal examinations were                            normal.                           The terminal ileum appeared normal.                           A 9 mm polyp was found in the rectum. The polyp was                            semi-pedunculated. The polyp was removed with a                            cold snare. Resection and retrieval were complete.  Two sessile polyps were found in the sigmoid colon.                            The polyps were 5 to 7 mm in size. These polyps                            were removed with a cold snare. Resection and                            retrieval were complete.                           A few medium-mouthed diverticula were found in the                            sigmoid colon.                           The exam was otherwise without abnormality on                            direct and retroflexion views. Random biopsies                            obtained throughout. Complications:            No immediate complications. Estimated blood loss:                            None. Estimated Blood Loss:     Estimated blood loss: none. Impression:               - The examined portion of the ileum was normal.                           - One 9 mm polyp in the rectum, removed with a cold                            snare. Resected  and retrieved.                           - Two 5 to 7 mm polyps in the sigmoid colon,                            removed with a cold snare. Resected and retrieved.                           - Mild diverticulosis in the sigmoid colon.                           - The examination was otherwise normal on direct                            and retroflexion views. Random biopsies obtained. Recommendation:           -  Repeat colonoscopy after studies are complete for                            surveillance based on pathology results.                           - Patient has a contact number available for                            emergencies. The signs and symptoms of potential                            delayed complications were discussed with the                            patient. Return to normal activities tomorrow.                            Written discharge instructions were provided to the                            patient.                           - Resume previous diet.                           - Continue present medications.                           - Await pathology results. Ladene Artist, MD 11/19/2019 8:29:16 AM This report has been signed electronically.

## 2019-11-19 NOTE — Progress Notes (Signed)
Called to room to assist during endoscopic procedure.  Patient ID and intended procedure confirmed with present staff. Received instructions for my participation in the procedure from the performing physician.  

## 2019-11-19 NOTE — Progress Notes (Signed)
To PACU, VSS. Report to [email or web address] 

## 2019-11-19 NOTE — Patient Instructions (Signed)
Handouts provided on polyps and diverticulosis.  ? ?YOU HAD AN ENDOSCOPIC PROCEDURE TODAY AT THE [facility]:   Refer to the procedure report that was given to you for any specific questions about what was found during the examination.  If the procedure report does not answer your questions, please call your gastroenterologist to clarify.  If you requested that your care partner not be given the details of your procedure findings, then the procedure report has been included in a sealed envelope for you to review at your convenience later. ? ?YOU SHOULD EXPECT: Some feelings of bloating in the abdomen. Passage of more gas than usual.  Walking can help get rid of the air that was put into your GI tract during the procedure and reduce the bloating. If you had a lower endoscopy (such as a colonoscopy or flexible sigmoidoscopy) you may notice spotting of blood in your stool or on the toilet paper. If you underwent a bowel prep for your procedure, you may not have a normal bowel movement for a few days. ? ?Please Note:  You might notice some irritation and congestion in your nose or some drainage.  This is from the oxygen used during your procedure.  There is no need for concern and it should clear up in a day or so. ? ?SYMPTOMS TO REPORT IMMEDIATELY: ? ?Following lower endoscopy (colonoscopy or flexible sigmoidoscopy): ? Excessive amounts of blood in the stool ? Significant tenderness or worsening of abdominal pains ? Swelling of the abdomen that is new, acute ? Fever of 100?F or higher ? ?For urgent or emergent issues, a gastroenterologist can be reached at any hour by calling [phone]. ?Do not use MyChart messaging for urgent concerns.  ? ? ?DIET:  We do recommend a small meal at first, but then you may proceed to your regular diet.  Drink plenty of fluids but you should avoid alcoholic beverages for 24 hours. ? ?ACTIVITY:  You should plan to take it easy for the rest of today and you should NOT  DRIVE or use heavy machinery until tomorrow (because of the sedation medicines used during the test).   ? ?FOLLOW UP: ?Our staff will call the number listed on your records 48-72 hours following your procedure to check on you and address any questions or concerns that you may have regarding the information given to you following your procedure. If we do not reach you, we will leave a message.  We will attempt to reach you two times.  During this call, we will ask if you have developed any symptoms of COVID 19. If you develop any symptoms (ie: fever, flu-like symptoms, shortness of breath, cough etc.) before then, please call [phone].  If you test positive for Covid 19 in the 2 weeks post procedure, please call and report this information to us.   ? ?If any biopsies were taken you will be contacted by phone or by letter within the next 1-3 weeks.  Please call us at [phone] if you have not heard about the biopsies in 3 weeks.  ? ? ?SIGNATURES/CONFIDENTIALITY: ?You and/or your care partner have signed paperwork which will be entered into your electronic medical record.  These signatures attest to the fact that that the information above on your After Visit Summary has been reviewed and is understood.  Full responsibility of the confidentiality of this discharge information lies with you and/or your care-partner. ? ?

## 2019-11-23 ENCOUNTER — Telehealth: Payer: Self-pay | Admitting: *Deleted

## 2019-11-23 NOTE — Telephone Encounter (Signed)
  Follow up Call-  Call back number 11/19/2019  Post procedure Call Back phone  # [phone]  Permission to leave phone message Yes     Patient questions:  Do you have a fever, pain , or abdominal swelling? No. Pain Score  0 *  Have you tolerated food without any problems? Yes.    Have you been able to return to your normal activities? Yes.    Do you have any questions about your discharge instructions: Diet   No. Medications  No. Follow up visit  No.  Do you have questions or concerns about your Care? No.  Actions: * If pain score is 4 or above: No action needed, pain <4  1. Have you developed a fever since your procedure? NO  2.   Have you had an respiratory symptoms (SOB or cough) since your procedure? NO  3.   Have you tested positive for COVID 19 since your procedure NO  4.   Have you had any family members/close contacts diagnosed with the COVID 19 since your procedure?  NO   If yes to any of these questions please route to Joylene John, RN and Joella Prince, RN

## 2019-11-24 ENCOUNTER — Encounter: Payer: Federal, State, Local not specified - PPO | Admitting: Physical Therapy

## 2019-11-25 ENCOUNTER — Encounter: Payer: Self-pay | Admitting: Physical Therapy

## 2019-11-25 ENCOUNTER — Ambulatory Visit (INDEPENDENT_AMBULATORY_CARE_PROVIDER_SITE_OTHER): Payer: Medicare Other | Admitting: Physical Therapy

## 2019-11-25 ENCOUNTER — Other Ambulatory Visit: Payer: Self-pay

## 2019-11-25 ENCOUNTER — Encounter: Payer: Self-pay | Admitting: Gastroenterology

## 2019-11-25 DIAGNOSIS — G8929 Other chronic pain: Secondary | ICD-10-CM

## 2019-11-25 DIAGNOSIS — M545 Low back pain, unspecified: Secondary | ICD-10-CM | POA: Diagnosis not present

## 2019-11-25 NOTE — Therapy (Signed)
[facility] [address] [place], [place], [address] Phone: [phone]   Fax:  [phone]  Physical Therapy Treatment  Patient Details  Name: Kimberly [name] MRN: [id number] Date of Birth: 1950/07/25 Referring Provider (PT): [name]   Encounter Date: 11/25/2019   PT End of Session - 11/25/19 1353    Visit Number 9    Number of Visits 20    Date for PT Re-Evaluation 12/30/19    Authorization Type Medicare    PN done on visit 8    PT Start Time 1347    PT Stop Time 1430    PT Time Calculation (min) 43 min    Activity Tolerance Patient tolerated treatment well    Behavior During Therapy [facility] for tasks assessed/performed           Past Medical History:  Diagnosis Date  . Arthritis   . Cataract   . Diverticulosis   . Meniere's disease, bilateral   . Osteopenia     Past Surgical History:  Procedure Laterality Date  . ABDOMINAL HYSTERECTOMY    . CHOLECYSTECTOMY    . EYE SURGERY    . THYROIDECTOMY, PARTIAL      There were no vitals filed for this visit.   Subjective Assessment - 11/25/19 1352    Subjective Pt states quite a bit less soreness in hip. Knee still bothering her.    Patient Stated Goals decreased pain    Currently in Pain? Yes    Pain Score 1     Pain Location Knee    Pain Orientation Right    Pain Descriptors / Indicators Aching    Pain Type Chronic pain    Pain Onset More than a month ago    Pain Frequency Intermittent                             [facility] Adult PT Treatment/Exercise - 11/25/19 1350      Posture/Postural Control   Posture Comments Scoliosis,  L shoulder lower, L Le longer in supine      Exercises   Exercises Lumbar      Lumbar Exercises: Stretches   Single Knee to Chest Stretch --    Gastroc Stretch 2 reps;30 seconds    Gastroc Stretch Limitations at wall    Other Lumbar Stretch Exercise Standing QL/SB stretch 30 sec x 3;       Lumbar Exercises: Aerobic    Recumbent Bike L2 x 8 min ,       Lumbar Exercises: Standing   Row 20 reps    Theraband Level (Row) Level 3 (Green)    Other Standing Lumbar Exercises standing hip abd, march ,20 bil;       Lumbar Exercises: Supine   Clam 20 reps    Clam Limitations GTB    Bridge --    Straight Leg Raise 20 reps    Straight Leg Raises Limitations bil      Lumbar Exercises: Sidelying   Hip Abduction 20 reps      Modalities   Modalities Moist Heat      Manual Therapy   Manual Therapy Joint mobilization;Soft tissue mobilization;Passive ROM;Manual Traction    Soft tissue mobilization STM to distal HS and proximal Gastroc , avoiding poplitial fossa/bakers cyst. Bilaterally                   PT Education - 11/25/19 1657    Education  Details HEP reviwed    Person(s) Educated Patient    Methods Explanation;Demonstration;Verbal cues;Handout    Comprehension Verbalized understanding;Returned demonstration;Verbal cues required            PT Short Term Goals - 11/18/19 1154      PT SHORT TERM GOAL #1   Title Pt to be independent with initial HEP    Time 2    Period Weeks    Status Achieved    Target Date 10/27/19             PT Long Term Goals - 11/25/19 1702      PT LONG TERM GOAL #1   Title Pt to be independent with final HEP    Time 6    Period Weeks    Status Achieved      PT LONG TERM GOAL #2   Title Pt to report decreased pain in lumbar region to 0-2/10 with activity    Time 6    Period Weeks    Status Achieved      PT LONG TERM GOAL #3   Title Pt to demo improved lumbar ROM to be [facility] and pain free, to improve ability for ADLs.    Time 6    Period Weeks    Status Achieved      PT LONG TERM GOAL #4   Title Pt to demo improved strength of Bil hips and knees to at least 4+/5 to improve pain and stability    Time 6    Period Weeks    Status Partially Met                 Plan - 11/25/19 1658    Clinical Impression Statement Pt reports much improved  pain in hip. States she has been able to do all regular activities around the house without pain. Knees continue to be bothersome. Attempted manual therapy today, but pt very tender in posterior knee, distal HS and proximal gastroc. Pt has met most goals at this time, ready for d/c to HEP. Will put pt on hold, until after next MD f/u. Pt to return in next 2 weeks if she has increased pain, otherwise will d/c.    Personal Factors and Comorbidities Fitness;Time since onset of injury/illness/exacerbation;Comorbidity 1    Comorbidities Scoliosis, B knee pain    Examination-Activity Limitations Locomotion Level;Bend;Lift;Stand;Stairs    Examination-Participation Restrictions Cleaning;Meal Prep;Yard Work;Community Activity;Laundry;Shop    Stability/Clinical Decision Making Evolving/Moderate complexity    Rehab Potential Good    PT Frequency 2x / week    PT Duration 6 weeks    PT Treatment/Interventions ADLs/Self Care Home Management;Cryotherapy;Electrical Stimulation;Ultrasound;Traction;Moist Heat;Iontophoresis 88m/ml Dexamethasone;Gait training;Stair training;Functional mobility training;Therapeutic activities;Therapeutic exercise;Balance training;Orthotic Fit/Training;Patient/family education;Neuromuscular re-education;Manual techniques;Passive range of motion;Dry needling;Vasopneumatic Device;Joint Manipulations;Spinal Manipulations    Consulted and Agree with Plan of Care Patient           Patient will benefit from skilled therapeutic intervention in order to improve the following deficits and impairments:  Abnormal gait, Pain, Improper body mechanics, Postural dysfunction, Increased muscle spasms, Decreased mobility, Decreased activity tolerance, Decreased range of motion, Decreased strength, Impaired flexibility  Visit Diagnosis: Chronic right-sided low back pain without sciatica     Problem List Patient Active Problem List   Diagnosis Date Noted  . Baker's cyst of knee, right 09/28/2019   . Hyperlipidemia, mixed 09/01/2019  . Nonallopathic lesion of lumbar region 08/10/2019  . Nonallopathic lesion of thoracic region 08/10/2019  . Nonallopathic lesion of sacral region 08/10/2019  .  Partial tear of right hamstring 07/13/2019  . Meniere's disease of both ears 10/05/2018  . Osteopenia 10/05/2018  . Low back pain potentially associated with spinal stenosis 10/05/2018    Lyndee Hensen, PT, DPT 5:02 PM  11/25/19    [place], [place], [address] Phone: [phone]   Fax:  [phone]  Name: Brynlie Daza MRN: [id number] Date of Birth: 1950/06/15

## 2019-11-25 NOTE — Patient Instructions (Signed)
Access Code: [email or web address] URL: [email or web address] Date: 11/25/2019 Prepared by: Lyndee Hensen  Exercises Standing Sidebending with Chair Support - 2 x daily - 3 reps - 30 hold Gastroc Stretch on Wall - 2 x daily - 3 sets - 30 hold Standing Hip Abduction with Counter Support - 1 x daily - 2 sets - 10 reps Standing Row with Anchored Resistance - 1 x daily - 2 sets - 10 reps Seated Hamstring Stretch - 2 x daily - 3 sets - 60 hold Seated Knee Extension AROM - 1 x daily - 2 sets - 10 reps Supine Single Knee to Chest Stretch - 2 x daily - 60 hold - 3 sets Supine Figure 4 Piriformis Stretch with Leg Extension - 2 x daily - 60 hold - 3 sets Supine Piriformis Stretch with Leg Straight - 2 x daily - 60 hold - 3 sets Straight Leg Raise - 1 x daily - 1 sets - 10 reps Hooklying Clamshell with Resistance - 1 x daily - 2 sets - 10 reps Supine Bridge - 1 x daily - 2 sets - 10 reps

## 2019-11-29 ENCOUNTER — Ambulatory Visit: Payer: Self-pay

## 2019-11-29 ENCOUNTER — Ambulatory Visit (INDEPENDENT_AMBULATORY_CARE_PROVIDER_SITE_OTHER): Payer: Medicare Other | Admitting: Family Medicine

## 2019-11-29 ENCOUNTER — Other Ambulatory Visit: Payer: Self-pay

## 2019-11-29 ENCOUNTER — Encounter: Payer: Self-pay | Admitting: Family Medicine

## 2019-11-29 VITALS — BP 112/78 | HR 87 | Ht 68.0 in | Wt 188.0 lb

## 2019-11-29 DIAGNOSIS — M25562 Pain in left knee: Secondary | ICD-10-CM

## 2019-11-29 DIAGNOSIS — M25561 Pain in right knee: Secondary | ICD-10-CM | POA: Diagnosis not present

## 2019-11-29 DIAGNOSIS — M1711 Unilateral primary osteoarthritis, right knee: Secondary | ICD-10-CM | POA: Insufficient documentation

## 2019-11-29 DIAGNOSIS — G8929 Other chronic pain: Secondary | ICD-10-CM | POA: Diagnosis not present

## 2019-11-29 DIAGNOSIS — M545 Low back pain, unspecified: Secondary | ICD-10-CM | POA: Diagnosis not present

## 2019-11-29 DIAGNOSIS — M999 Biomechanical lesion, unspecified: Secondary | ICD-10-CM

## 2019-11-29 NOTE — Patient Instructions (Signed)
  Drained knee today See me again in 1-2 months

## 2019-11-29 NOTE — Assessment & Plan Note (Signed)
Aspiration done today.  Second injection on this knee in 2 months.  We discussed with patient about posture and ergonomics, potential bracing the patient denies any instability.  Patient likely does have further arthritic changes and x-rays show.  Patient could be a candidate for viscosupplementation and we will see if we get approval.  Follow-up with me again 4 to 8 weeks

## 2019-11-29 NOTE — Assessment & Plan Note (Addendum)
   Decision today to treat with OMT was based on Physical Exam  After verbal consent patient was treated with  ME, FPR techniques in  thoracic, lumbar and sacral areas, all areas are chronic   Patient tolerated the procedure well with improvement in symptoms  Patient given exercises, stretches and lifestyle modifications  See medications in patient instructions if given  Patient will follow up in 4-8 weeks

## 2019-11-29 NOTE — Progress Notes (Signed)
Kimberly [name] Phone: [phone] Subjective:   Kimberly [name], am serving as a scribe for Dr. Hulan Saas. This visit occurred during the SARS-CoV-2 public health emergency.  Safety protocols were in place, including screening questions prior to the visit, additional usage of staff PPE, and extensive cleaning of exam room while observing appropriate contact time as indicated for disinfecting solutions.   I'm seeing this patient by the request  of:  Orma Flaming, MD  CC: Low back pain follow-up, knee pain follow-up  HEN:[id number]   10/01/2019 I do believe that patient is having an exacerbation with spinal stenosis.  Started on gabapentin.  Toradol and Depo-Medrol given today.  Discussed x-rays which were ordered today.  Worsening symptoms I would like the patient to consider the MRI.  The moment we will start with physical therapy.  Follow-up again in 5 to 6 weeks  Update 11/29/2019 Kimberly [name] is a 69 y.o. female coming in with complaint of low back pain. Patient states that her back pain has improved. Physical therapy has helped and she has transitioned in home exercise program.  Patient does state that the improvement has been good.  Does notice some improvement with the manipulation  Feels like she has Baker's cysts behind each knee. Has history of aspiration of cyst. Now feeling tightness and visible swelling.  Patient states that the right knee is worse than the left.  Describes it as a dull, throbbing aching sensation and feels that the fluid is reaccumulated.  Affecting how she does the exercises for her back.       Past Medical History:  Diagnosis Date  . Arthritis   . Cataract   . Diverticulosis   . Meniere's disease, bilateral   . Osteopenia    Past Surgical History:  Procedure Laterality Date  . ABDOMINAL HYSTERECTOMY    . CHOLECYSTECTOMY    . EYE SURGERY    . THYROIDECTOMY, PARTIAL     Social  History   Socioeconomic History  . Marital status: Single    Spouse name: Not on file  . Number of children: Not on file  . Years of education: Not on file  . Highest education level: Not on file  Occupational History  . Occupation: Marine scientist  Tobacco Use  . Smoking status: Current Every Day Smoker    Packs/day: 1.00    Types: Cigarettes  . Smokeless tobacco: Never Used  Vaping Use  . Vaping Use: Never used  Substance and Sexual Activity  . Alcohol use: Yes    Alcohol/week: 21.0 standard drinks    Types: 21 Glasses of wine per week    Comment: 3 glasses of wine per day  . Drug use: Never  . Sexual activity: Not Currently  Other Topics Concern  . Not on file  Social History Narrative  . Not on file   Social Determinants of Health   Financial Resource Strain:   . Difficulty of Paying Living Expenses: Not on file  Food Insecurity:   . Worried About Charity fundraiser in the Last Year: Not on file  . Ran Out of Food in the Last Year: Not on file  Transportation Needs:   . Lack of Transportation (Medical): Not on file  . Lack of Transportation (Non-Medical): Not on file  Physical Activity:   . Days of Exercise per Week: Not on file  . Minutes of Exercise per Session: Not on file  Stress:   .  Feeling of Stress : Not on file  Social Connections:   . Frequency of Communication with Friends and Family: Not on file  . Frequency of Social Gatherings with Friends and Family: Not on file  . Attends Religious Services: Not on file  . Active Member of Clubs or Organizations: Not on file  . Attends Archivist Meetings: Not on file  . Marital Status: Not on file   Allergies  Allergen Reactions  . Erythromycin Diarrhea  . Clindamycin/Lincomycin Nausea And Vomiting  . Morphine And Related Itching  . Latex Rash   Family History  Problem Relation Age of Onset  . Colon polyps Mother   . Heart disease Mother   . Heart disease Father   . Stomach cancer Paternal  Grandfather       Current Outpatient Medications (Respiratory):  .  cetirizine (ZYRTEC) 10 MG tablet, Take 10 mg by mouth daily.  Current Outpatient Medications (Analgesics):  .  aspirin EC 81 MG tablet, Take 81 mg by mouth daily.   Current Outpatient Medications (Other):  Marland Kitchen  Calcium Carbonate-Vit D-Min (CALCIUM 1200 PO), Take by mouth daily. Marland Kitchen  gabapentin (NEURONTIN) 100 MG capsule, Take 2 capsules (200 mg total) by mouth at bedtime. .  Multiple Vitamins-Minerals (HAIR SKIN AND NAILS FORMULA PO), Take by mouth. .  Multiple Vitamins-Minerals (WOMENS MULTIVITAMIN PO), Take by mouth. .  vitamin E (VITAMIN E) 200 UNIT capsule, Take 200 Units by mouth daily.   Reviewed prior external information including notes and imaging from  primary care provider As well as notes that were available from care everywhere and other healthcare systems.  Past medical history, social, surgical and family history all reviewed in electronic medical record.  [name] unless stated regarding to the chief complaint.   Review of Systems:  [name] headache, visual changes, nausea, vomiting, diarrhea, constipation, dizziness, abdominal pain, skin rash, fevers, chills, night sweats, weight loss, swollen lymph nodes, body aches, joint swelling, chest pain, shortness of breath, mood changes. POSITIVE muscle aches  Objective  Blood pressure 112/78, pulse 87, height 5\' 8"  (1.727 m), weight 188 lb (85.3 kg), SpO2 97 %.   General: [name] apparent distress alert and oriented x3 mood and affect normal, dressed appropriately.  Respiratory: Patient's speak in full sentences and does not appear short of breath  Cardiovascular: [name] lower extremity edema, non tender, [name] erythema  Mild antalgic gait Patient does have a knee effusion on the right side.  Instability with valgus and varus force noted.  Patient does have lateral tracking of the patella noted.  Lacks last 10 degrees of flexion.  Low back exam shows the  patient does have some loss of lordosis, tightness noted in all planes.  Some degenerative scoliosis noted.  Procedure: Real-time Ultrasound Guided Injection of right knee Device: GE Logiq Q7 Ultrasound guided injection is preferred based studies that show increased duration, increased effect, greater accuracy, decreased procedural pain, increased response rate, and decreased cost with ultrasound guided versus blind injection.  Verbal informed consent obtained.  Time-out conducted.  Noted [name] overlying erythema, induration, or other signs of local infection.  Skin prepped in a sterile fashion.  Local anesthesia: Topical Ethyl chloride.  With sterile technique and under real time ultrasound guidance: With a 22-gauge 2 inch needle patient was injected with 4 cc of 0.5% Marcaine and aspirated 35 cc of straw-colored fluid then injected 1 cc of Kenalog 40 mg/dL. This was from a superior lateral approach into the patellofemoral joint.  Completed  without difficulty  Pain immediately resolved suggesting accurate placement of the medication.  Advised to call if fevers/chills, erythema, induration, drainage, or persistent bleeding.   Impression: Technically successful ultrasound guided injection.   Osteopathic findings  T7 extended rotated and side bent left L2 flexed rotated and side bent right L4 flexed rotated and side bent right Sacrum right on right    Impression and Recommendations:     The above documentation has been reviewed and is accurate and complete Lyndal Pulley, DO

## 2019-11-29 NOTE — Assessment & Plan Note (Signed)
Attempted osteopathic manipulation.  Patient did make improvement with the physical therapy.  Continue the gabapentin at nighttime as needed.  Follow-up with me again in 4 to 8 weeks

## 2019-12-02 ENCOUNTER — Encounter: Payer: Self-pay | Admitting: Family Medicine

## 2019-12-02 ENCOUNTER — Other Ambulatory Visit: Payer: Self-pay

## 2019-12-02 ENCOUNTER — Ambulatory Visit (INDEPENDENT_AMBULATORY_CARE_PROVIDER_SITE_OTHER): Payer: Medicare Other | Admitting: Family Medicine

## 2019-12-02 VITALS — BP 128/64 | HR 65 | Temp 97.1°F | Ht 68.0 in | Wt 186.4 lb

## 2019-12-02 DIAGNOSIS — Z23 Encounter for immunization: Secondary | ICD-10-CM

## 2019-12-02 DIAGNOSIS — K1379 Other lesions of oral mucosa: Secondary | ICD-10-CM

## 2019-12-02 DIAGNOSIS — D72821 Monocytosis (symptomatic): Secondary | ICD-10-CM | POA: Diagnosis not present

## 2019-12-02 DIAGNOSIS — L600 Ingrowing nail: Secondary | ICD-10-CM

## 2019-12-02 DIAGNOSIS — D229 Melanocytic nevi, unspecified: Secondary | ICD-10-CM | POA: Diagnosis not present

## 2019-12-02 NOTE — Patient Instructions (Addendum)
-  flu shot today   -derm referral done to [facility] dermatology. Please make sure you have this location.  -referral to podiatry done.   -referral to Dr. 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 May just be normal variant, but I don't like that it's hurting you.   -repeat labs today.   -think about statin drug to decrease 10 year risk of heart attack or stroke. Just email me or we can discuss at f/u.   -stop aspirin. No indication.

## 2019-12-02 NOTE — Progress Notes (Signed)
Patient: Kimberly [name] MRN: [id number] DOB: [birth year] PCP: Kimberly Flaming, MD     Subjective:  Chief Complaint  Patient presents with  . monocytosis    Repeat CBC; Pt says she had 2 cups coffee before Annual visit in July.  . Ingrown Toenail  . pain in hard palate  . Hyperlipidemia    HPI: The patient is a 69 y.o. female who presents today for follow up labs/CBC.   She is also requesting her flu shot. She has had her covid booster as well.   On her labs three months ago she had monocytosis so we are going to repeat labs today. Does drink alcohol daily. ? If source.   Pain and lesion in hard palate. Dentist was concerned. Has continued to hurt her for over a month. Area is pink, no white. No bleeding.   Requesting derm referral. She has no history of skin cancer, but would like to be seen.   -on baby aspirin, no indication for this. Will stop.   -she has an ingrown toenail, would like to see podiatry.   Review of Systems  Constitutional: Negative for chills, fatigue and fever.  HENT: Negative for dental problem, ear pain, hearing loss and trouble swallowing.   Eyes: Negative for visual disturbance.  Respiratory: Negative for cough, chest tightness and shortness of breath.   Cardiovascular: Negative for chest pain, palpitations and leg swelling.  Gastrointestinal: Negative for abdominal pain, blood in stool, diarrhea and nausea.  Endocrine: Negative for cold intolerance, polydipsia, polyphagia and polyuria.  Genitourinary: Negative for dysuria, hematuria, pelvic pain and urgency.  Musculoskeletal: Negative for arthralgias and back pain.  Skin: Negative for rash.  Neurological: Negative for dizziness, light-headedness and headaches.  Psychiatric/Behavioral: Negative for dysphoric mood and sleep disturbance. The patient is not nervous/anxious.     Allergies Patient is allergic to erythromycin, clindamycin/lincomycin, morphine and related, and latex.  Past Medical  History Patient  has a past medical history of Arthritis, Cataract, Diverticulosis, Meniere's disease, bilateral, and Osteopenia.  Surgical History Patient  has a past surgical history that includes Thyroidectomy, partial; Abdominal hysterectomy; Cholecystectomy; and Eye surgery.  Family History Pateint's family history includes Colon polyps in her mother; Heart disease in her father and mother; Stomach cancer in her paternal grandfather.  Social History Patient  reports that she has been smoking cigarettes. She has been smoking about 1.00 pack per day. She has never used smokeless tobacco. She reports current alcohol use of about 21.0 standard drinks of alcohol per week. She reports that she does not use drugs.    Objective: Vitals:   12/02/19 0806  BP: 128/64  Pulse: 65  Temp: (!) 97.1 F (36.2 C)  TempSrc: Temporal  SpO2: 98%  Weight: 186 lb 6.4 oz (84.6 kg)  Height: 5\' 8"  (1.727 m)    Body mass index is 28.34 kg/m.  Physical Exam Vitals reviewed.  Constitutional:      Appearance: Normal appearance.  HENT:     Mouth/Throat:     Comments: ?clefted hard palate no leukoplaquia or abnormal lesions.  Skin:    Comments: Right big toe. Not impressive, but thickened nail slightly starting to grow inward. No infection.   Neurological:     General: No focal deficit present.     Mental Status: She is alert and oriented to person, place, and time.  Psychiatric:        Mood and Affect: Mood normal.        Behavior: Behavior normal.  Assessment/plan: 1. Monocytosis Repeat labs, lean toward alcohol as cause.  - CBC with Differential/Platelet; Future - Pathologist smear review; Future  2. Atypical mole  - Ambulatory referral to Dermatology  3. Ingrown toenail  - Ambulatory referral to Podiatry  4. Lesion of hard palate  - Ambulatory referral to ENT  5. Hyperlipidemia Discussed ASCVD risk is over 10% and strong family history recommend statin for risk  prevention. She is going to think about this and let me know.   STOP aspirin, no indication for this.   This visit occurred during the SARS-CoV-2 public health emergency.  Safety protocols were in place, including screening questions prior to the visit, additional usage of staff PPE, and extensive cleaning of exam room while observing appropriate contact time as indicated for disinfecting solutions.     Return in about 9 months (around 08/31/2020) for labs/routine visit. Kimberly Flaming, MD [place]   12/02/2019

## 2019-12-03 LAB — PATHOLOGIST SMEAR REVIEW

## 2019-12-03 LAB — CBC WITH DIFFERENTIAL/PLATELET
Absolute Monocytes: 1067 cells/uL — ABNORMAL HIGH (ref 200–950)
Basophils Absolute: 63 cells/uL (ref 0–200)
Basophils Relative: 0.8 %
Eosinophils Absolute: 40 cells/uL (ref 15–500)
Eosinophils Relative: 0.5 %
HCT: 40.1 % (ref 35.0–45.0)
Hemoglobin: 12.9 g/dL (ref 11.7–15.5)
Lymphs Abs: 2710 cells/uL (ref 850–3900)
MCH: 25.9 pg — ABNORMAL LOW (ref 27.0–33.0)
MCHC: 32.2 g/dL (ref 32.0–36.0)
MCV: 80.4 fL (ref 80.0–100.0)
MPV: 11.5 fL (ref 7.5–12.5)
Monocytes Relative: 13.5 %
Neutro Abs: 4021 cells/uL (ref 1500–7800)
Neutrophils Relative %: 50.9 %
Platelets: 255 10*3/uL (ref 140–400)
RBC: 4.99 10*6/uL (ref 3.80–5.10)
RDW: 13.3 % (ref 11.0–15.0)
Total Lymphocyte: 34.3 %
WBC: 7.9 10*3/uL (ref 3.8–10.8)

## 2019-12-06 ENCOUNTER — Other Ambulatory Visit: Payer: Self-pay | Admitting: Family Medicine

## 2019-12-06 ENCOUNTER — Encounter: Payer: Self-pay | Admitting: Family Medicine

## 2019-12-06 DIAGNOSIS — E2839 Other primary ovarian failure: Secondary | ICD-10-CM

## 2019-12-06 MED ORDER — ROSUVASTATIN CALCIUM 5 MG PO TABS: 5.0000 mg | ORAL_TABLET | Freq: Every day | ORAL | 3 refills | Status: DC

## 2019-12-06 NOTE — Telephone Encounter (Signed)
Pt is requesting Statin per last visit (12/02/2019)  Please Advise.

## 2019-12-07 ENCOUNTER — Ambulatory Visit
Admission: RE | Admit: 2019-12-07 | Discharge: 2019-12-07 | Disposition: A | Discharge status: Home / Self Care | Payer: Medicare Other | Source: Ambulatory Visit | Attending: Family Medicine | Admitting: Family Medicine

## 2019-12-07 ENCOUNTER — Other Ambulatory Visit: Payer: Self-pay

## 2019-12-07 DIAGNOSIS — Z1231 Encounter for screening mammogram for malignant neoplasm of breast: Secondary | ICD-10-CM

## 2019-12-07 DIAGNOSIS — E2839 Other primary ovarian failure: Secondary | ICD-10-CM

## 2019-12-13 ENCOUNTER — Ambulatory Visit (INDEPENDENT_AMBULATORY_CARE_PROVIDER_SITE_OTHER): Payer: Medicare Other | Admitting: Podiatry

## 2019-12-13 ENCOUNTER — Other Ambulatory Visit: Payer: Self-pay

## 2019-12-13 DIAGNOSIS — L600 Ingrowing nail: Secondary | ICD-10-CM | POA: Diagnosis not present

## 2019-12-13 MED ORDER — GENTAMICIN SULFATE 0.1 % EX CREA: 1.0000 "application " | TOPICAL_CREAM | Freq: Two times a day (BID) | CUTANEOUS | 1 refills | Status: DC

## 2019-12-13 NOTE — Patient Instructions (Addendum)
RECOMMEND UREA 50% NAIL GEL AVAILABLE ON [employer] AS A NAIL SOFTENER    Soak Instructions  THE DAY AFTER THE PROCEDURE  Place 1/4 cup of epsom salts in a quart of warm tap water.  Submerge your foot or feet with outer bandage intact for the initial soak; this will allow the bandage to become moist and wet for easy lift off.  Once you remove your bandage, continue to soak in the solution for 20 minutes.  This soak should be done twice a day.  Next, remove your foot or feet from solution, blot dry the affected area and cover.  You may use a band aid large enough to cover the area or use gauze and tape.  Apply other medications to the area as directed by the doctor such as polysporin neosporin.  IF YOUR SKIN BECOMES IRRITATED WHILE USING THESE INSTRUCTIONS, IT IS OKAY TO SWITCH TO  WHITE VINEGAR AND WATER. Or you may use antibacterial soap and water to keep the toe clean  Monitor for any signs/symptoms of infection. Call the office immediately if any occur or go directly to the emergency room. Call with any questions/concerns.    [facility] Instructions-Post Nail Surgery  You have had your ingrown toenail and root treated with a chemical.  This chemical causes a burn that will drain and ooze like a blister.  This can drain for 6-8 weeks or longer.  It is important to keep this area clean, covered, and follow the soaking instructions dispensed at the time of your surgery.  This area will eventually dry and form a scab.  Once the scab forms you no longer need to soak or apply a dressing.  If at any time you experience an increase in pain, redness, swelling, or drainage, you should contact the office as soon as possible.

## 2019-12-13 NOTE — Progress Notes (Signed)
   Subjective: Patient presents today for evaluation of pain to the lateral border of the right great toe. Patient is concerned for possible ingrown nail. Patient presents today for further treatment and evaluation.  Past Medical History:  Diagnosis Date  . Arthritis   . Cataract   . Diverticulosis   . Meniere's disease, bilateral   . Osteopenia     Objective:  General: Well developed, nourished, in no acute distress, alert and oriented x3   Dermatology: Skin is warm, dry and supple bilateral.  Lateral border right great toe appears to be erythematous with evidence of an ingrowing nail. Pain on palpation noted to the border of the nail fold. The remaining nails appear unremarkable at this time. There are no open sores, lesions.  Vascular: Dorsalis Pedis artery and Posterior Tibial artery pedal pulses palpable. No lower extremity edema noted.   Neruologic: Grossly intact via light touch bilateral.  Musculoskeletal: Muscular strength within normal limits in all groups bilateral. Normal range of motion noted to all pedal and ankle joints.   Assesement: #1 Paronychia with ingrowing nail lateral border right great toe #2 Pain in toe #3 Incurvated nail  Plan of Care:  1. Patient evaluated.  2. Discussed treatment alternatives and plan of care. Explained nail avulsion procedure and post procedure course to patient. 3. Patient opted for permanent partial nail avulsion of the lateral border right great toe.  4. Prior to procedure, local anesthesia infiltration utilized using 3 ml of a 50:50 mixture of 2% plain lidocaine and 0.5% plain marcaine in a normal hallux block fashion and a betadine prep performed.  5. Partial permanent nail avulsion with chemical matrixectomy performed using [id number] applications of phenol followed by alcohol flush.  6. Light dressing applied. 7.  Prescription for gentamicin cream  8.  Return to clinic 2 weeks.  Edrick Kins, DPM [facility]  Dr. Edrick Kins, DPM    [address], [place] [address]                Office [phone]  Fax [phone]

## 2019-12-14 NOTE — Telephone Encounter (Signed)
Patient scheduled for tomorrow

## 2019-12-15 ENCOUNTER — Ambulatory Visit (INDEPENDENT_AMBULATORY_CARE_PROVIDER_SITE_OTHER): Payer: Medicare Other | Admitting: Family Medicine

## 2019-12-15 ENCOUNTER — Ambulatory Visit: Payer: Self-pay

## 2019-12-15 ENCOUNTER — Encounter: Payer: Self-pay | Admitting: Family Medicine

## 2019-12-15 ENCOUNTER — Other Ambulatory Visit: Payer: Self-pay

## 2019-12-15 VITALS — BP 120/82 | HR 76 | Ht 68.0 in | Wt 186.0 lb

## 2019-12-15 DIAGNOSIS — M25562 Pain in left knee: Secondary | ICD-10-CM | POA: Diagnosis not present

## 2019-12-15 DIAGNOSIS — M170 Bilateral primary osteoarthritis of knee: Secondary | ICD-10-CM

## 2019-12-15 DIAGNOSIS — G8929 Other chronic pain: Secondary | ICD-10-CM

## 2019-12-15 NOTE — Progress Notes (Signed)
[place] Phone: [phone] Subjective:   [name], am serving as a scribe for Dr. Hulan Saas. This visit occurred during the SARS-CoV-2 public health emergency.  Safety protocols were in place, including screening questions prior to the visit, additional usage of staff PPE, and extensive cleaning of exam room while observing appropriate contact time as indicated for disinfecting solutions.   I'm seeing this patient by the request  of:  Orma Flaming, MD  CC: Knee pain follow-up  [id number]   11/29/2019 Aspiration done today.  Second injection on this knee in 2 months.  We discussed with patient about posture and ergonomics, potential bracing the patient denies any instability.  Patient likely does have further arthritic changes and x-rays show.  Patient could be a candidate for viscosupplementation and we will see if we get approval.  Follow-up with me again 4 to 8 weeks   Update 12/15/2019 Kimberly [name] is a 69 y.o. female coming in with complaint of right knee pain. Monovisc approved on 11/30/2019. Right knee pain has improved since last visit.  Bilateral knee pain right greater than left.  Starting to affect daily activities.  Complaining of left knee pain in posterior aspect. Hard to fully extend and flex knee.       Past Medical History:  Diagnosis Date   Arthritis    Cataract    Diverticulosis    Meniere's disease, bilateral    Osteopenia    Past Surgical History:  Procedure Laterality Date   ABDOMINAL HYSTERECTOMY     CHOLECYSTECTOMY     EYE SURGERY     THYROIDECTOMY, PARTIAL     Social History   Socioeconomic History   Marital status: Single    Spouse name: Not on file   Number of children: Not on file   Years of education: Not on file   Highest education level: Not on file  Occupational History   Occupation: Marine scientist  Tobacco Use   Smoking status:  Current Every Day Smoker    Packs/day: 1.00    Types: Cigarettes   Smokeless tobacco: Never Used  Vaping Use   Vaping Use: Never used  Substance and Sexual Activity   Alcohol use: Yes    Alcohol/week: 21.0 standard drinks    Types: 21 Glasses of wine per week    Comment: 3 glasses of wine per day   Drug use: Never   Sexual activity: Not Currently  Other Topics Concern   Not on file  Social History Narrative   Not on file   Social Determinants of Health   Financial Resource Strain:    Difficulty of Paying Living Expenses: Not on file  Food Insecurity:    Worried About [place] in the Last Year: Not on file   Ran Out of Food in the Last Year: Not on file  Transportation Needs:    Lack of Transportation (Medical): Not on file   Lack of Transportation (Non-Medical): Not on file  Physical Activity:    Days of Exercise per Week: Not on file   Minutes of Exercise per Session: Not on file  Stress:    Feeling of Stress : Not on file  Social Connections:    Frequency of Communication with Friends and Family: Not on file   Frequency of Social Gatherings with Friends and Family: Not on file   Attends Religious Services: Not on file   Active Member of Clubs  or Organizations: Not on file   Attends Archivist Meetings: Not on file   Marital Status: Not on file   Allergies  Allergen Reactions   Erythromycin Diarrhea   Clindamycin/Lincomycin Nausea And Vomiting   Morphine And Related Itching   Latex Rash   Family History  Problem Relation Age of Onset   Colon polyps Mother    Heart disease Mother    Heart disease Father    Stomach cancer Paternal Grandfather      Current Outpatient Medications (Cardiovascular):    rosuvastatin (CRESTOR) 5 MG tablet, Take 1 tablet (5 mg total) by mouth daily.  Current Outpatient Medications (Respiratory):    cetirizine (ZYRTEC) 10 MG tablet, Take 10 mg by mouth daily.    Current  Outpatient Medications (Other):    Calcium Carbonate-Vit D-Min (CALCIUM 1200 PO), Take by mouth daily.   gabapentin (NEURONTIN) 100 MG capsule, Take 2 capsules (200 mg total) by mouth at bedtime.   gentamicin cream (GARAMYCIN) 0.1 %, Apply 1 application topically 2 (two) times daily.   Multiple Vitamins-Minerals (HAIR SKIN AND NAILS FORMULA PO), Take by mouth.   Multiple Vitamins-Minerals (WOMENS MULTIVITAMIN PO), Take by mouth.   vitamin E (VITAMIN E) 200 UNIT capsule, Take 200 Units by mouth daily.   Reviewed prior external information including notes and imaging from  primary care provider As well as notes that were available from care everywhere and other healthcare systems.  Past medical history, social, surgical and family history all reviewed in electronic medical record.  No pertanent information unless stated regarding to the chief complaint.   Review of Systems:  No headache, visual changes, nausea, vomiting, diarrhea, constipation, dizziness, abdominal pain, skin rash, fevers, chills, night sweats, weight loss, swollen lymph nodes, body aches, joint swelling, chest pain, shortness of breath, mood changes. POSITIVE muscle aches  Objective  Blood pressure 120/82, pulse 76, height 5\' 8"  (1.727 m), weight 186 lb (84.4 kg), SpO2 98 %.   General: No apparent distress alert and oriented x3 mood and affect normal, dressed appropriately.  HEENT: Pupils equal, extraocular movements intact  Respiratory: Patient's speak in full sentences and does not appear short of breath  Cardiovascular: No lower extremity edema, non tender, no erythema  Knee exam bilaterally does show that patient has some mild arthritic changes.  Very mild abnormal thigh to calf ratio.  Very mild instability of the right knee with valgus and varus force.  Left knee seems to be fairly stable.  Tenderness over the medial joint line.  After informed written and verbal consent, patient was seated on exam table. Right  knee was prepped with alcohol swab and utilizing anterolateral approach, patient's right knee space was injected with 48 mg per 3 mL of Monovisc (sodium hyaluronate) in a prefilled syringe was injected easily into the knee through a 22-gauge needle..Patient tolerated the procedure well without immediate complications.  After informed written and verbal consent, patient was seated on exam table. Left knee was prepped with alcohol swab and utilizing anterolateral approach, patient's left knee space was injected with 40 mg per 3 mL of Monovisc (sodium hyaluronate) in a prefilled syringe was injected easily into the knee through a 22-gauge needle..Patient tolerated the procedure well without immediate complications.    Impression and Recommendations:     The above documentation has been reviewed and is accurate and complete Lyndal Pulley, DO

## 2019-12-15 NOTE — Patient Instructions (Signed)
Monovisc injections today See me again in 2 months

## 2019-12-15 NOTE — Assessment & Plan Note (Signed)
Bilateral Monovisc injections given today.  Patient has mild to moderate arthritic changes and I think patient will do very well with conservative therapy for quite some time.  Patient is to do icing regimen, home exercises, we discussed which activities to do which wants to avoid.  Discussed icing regimen.  Follow-up with me again 8 weeks

## 2019-12-16 ENCOUNTER — Encounter: Payer: Self-pay | Admitting: Family Medicine

## 2019-12-27 ENCOUNTER — Ambulatory Visit: Payer: Medicare Other | Admitting: Podiatry

## 2020-01-12 ENCOUNTER — Ambulatory Visit: Payer: Federal, State, Local not specified - PPO | Admitting: Family Medicine

## 2020-02-09 ENCOUNTER — Encounter: Payer: Self-pay | Admitting: Family Medicine

## 2020-02-09 ENCOUNTER — Ambulatory Visit (INDEPENDENT_AMBULATORY_CARE_PROVIDER_SITE_OTHER): Payer: Medicare Other | Admitting: Family Medicine

## 2020-02-09 ENCOUNTER — Other Ambulatory Visit: Payer: Self-pay

## 2020-02-09 VITALS — BP 130/80 | HR 77 | Ht 68.0 in | Wt 183.0 lb

## 2020-02-09 DIAGNOSIS — M170 Bilateral primary osteoarthritis of knee: Secondary | ICD-10-CM | POA: Diagnosis not present

## 2020-02-09 DIAGNOSIS — M545 Low back pain, unspecified: Secondary | ICD-10-CM

## 2020-02-09 DIAGNOSIS — M999 Biomechanical lesion, unspecified: Secondary | ICD-10-CM | POA: Diagnosis not present

## 2020-02-09 NOTE — Progress Notes (Signed)
[place] [address] [place] Phone: [phone] Subjective:   I Kimberly [name] am serving as a Education administrator for Dr. Hulan Saas.  This visit occurred during the SARS-CoV-2 public health emergency.  Safety protocols were in place, including screening questions prior to the visit, additional usage of staff PPE, and extensive cleaning of exam room while observing appropriate contact time as indicated for disinfecting solutions.   I'm seeing this patient by the request  of:  Orma Flaming, MD  CC: Knee pain follow-up, back pain follow-up  [id number]   12/15/2019 Bilateral Monovisc injections given today.  Patient has mild to moderate arthritic changes and I think patient will do very well with conservative therapy for quite some time.  Patient is to do icing regimen, home exercises, we discussed which activities to do which wants to avoid.  Discussed icing regimen.  Follow-up with me again 8 weeks   Update 02/09/2020 Kimberly [name] is a 69 y.o. female coming in with complaint of bilateral knee pain. Patient states she is feeling great today. Believes she is much better and close to 100%. States the knees do not bother her.  Patient is very happy with the success so far.  Is having some back pain.  Very mild radicular symptoms but nothing that stopping her from activity.  Staying very active.  Has had osteopathic manipulation which has been beneficial.  Mild increase in tightness since last exam but nothing severe       Past Medical History:  Diagnosis Date  . Arthritis   . Cataract   . Diverticulosis   . Meniere's disease, bilateral   . Osteopenia    Past Surgical History:  Procedure Laterality Date  . ABDOMINAL HYSTERECTOMY    . CHOLECYSTECTOMY    . EYE SURGERY    . THYROIDECTOMY, PARTIAL     Social History   Socioeconomic History  . Marital status: Single    Spouse name: Not on file  . Number of children: Not on file  .  Years of education: Not on file  . Highest education level: Not on file  Occupational History  . Occupation: Marine scientist  Tobacco Use  . Smoking status: Current Every Day Smoker    Packs/day: 1.00    Types: Cigarettes  . Smokeless tobacco: Never Used  Vaping Use  . Vaping Use: Never used  Substance and Sexual Activity  . Alcohol use: Yes    Alcohol/week: 21.0 standard drinks    Types: 21 Glasses of wine per week    Comment: 3 glasses of wine per day  . Drug use: Never  . Sexual activity: Not Currently  Other Topics Concern  . Not on file  Social History Narrative  . Not on file   Social Determinants of Health   Financial Resource Strain: Not on file  Food Insecurity: Not on file  Transportation Needs: Not on file  Physical Activity: Not on file  Stress: Not on file  Social Connections: Not on file   Allergies  Allergen Reactions  . Erythromycin Diarrhea  . Clindamycin/Lincomycin Nausea And Vomiting  . Morphine And Related Itching  . Latex Rash   Family History  Problem Relation Age of Onset  . Colon polyps Mother   . Heart disease Mother   . Heart disease Father   . Stomach cancer Paternal Grandfather      Current Outpatient Medications (Cardiovascular):  .  rosuvastatin (CRESTOR) 5 MG tablet, Take 1 tablet (5  mg total) by mouth daily.  Current Outpatient Medications (Respiratory):  .  cetirizine (ZYRTEC) 10 MG tablet, Take 10 mg by mouth daily.    Current Outpatient Medications (Other):  Marland Kitchen  Calcium Carbonate-Vit D-Min (CALCIUM 1200 PO), Take by mouth daily. Marland Kitchen  gabapentin (NEURONTIN) 100 MG capsule, Take 2 capsules (200 mg total) by mouth at bedtime. Marland Kitchen  gentamicin cream (GARAMYCIN) 0.1 %, Apply 1 application topically 2 (two) times daily. .  Multiple Vitamins-Minerals (HAIR SKIN AND NAILS FORMULA PO), Take by mouth. .  Multiple Vitamins-Minerals (WOMENS MULTIVITAMIN PO), Take by mouth. .  vitamin E 200 UNIT capsule, Take 200 Units by mouth  daily.   Reviewed prior external information including notes and imaging from  primary care provider As well as notes that were available from care everywhere and other healthcare systems.  Past medical history, social, surgical and family history all reviewed in electronic medical record.  No pertanent information unless stated regarding to the chief complaint.   Review of Systems:  No headache, visual changes, nausea, vomiting, diarrhea, constipation, dizziness, abdominal pain, skin rash, fevers, chills, night sweats, weight loss, swollen lymph nodes, body aches, joint swelling, chest pain, shortness of breath, mood changes. POSITIVE muscle aches  Objective  Blood pressure 130/80, pulse 77, height 5\' 8"  (1.727 m), weight 183 lb (83 kg), SpO2 97 %.   General: No apparent distress alert and oriented x3 mood and affect normal, dressed appropriately.  HEENT: Pupils equal, extraocular movements intact  Respiratory: Patient's speak in full sentences and does not appear short of breath  Cardiovascular: No lower extremity edema, non tender, no erythema  Patient does have some arthritic changes of the knees bilaterally.  Trace effusion noted of the right knee.  Mild lateral tracking with patellar grind noted bilaterally.  Low back exam mild loss of lordosis.  Very mild degenerative scoliosis noted.  Tightness with right greater than left.  Negative straight leg test but tightness of the hamstrings bilaterally  Osteopathic findings  T11 extended rotated and side bent left L5 flexed rotated and side bent left Sacrum right on right     Impression and Recommendations:     The above documentation has been reviewed and is accurate and complete Pearlean Brownie, DO

## 2020-02-09 NOTE — Assessment & Plan Note (Addendum)
Significant improvement with viscosupplementation.  I am optimistic that this should do relatively well.  Discussed we can always do steroid injections if necessary.  Patient will follow up with me again in 6 to 8 weeks.  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

## 2020-02-09 NOTE — Assessment & Plan Note (Signed)
Chronic problem but stable.  Patient is taking gabapentin.  Staying very active.  Responds well to osteopathic manipulation.  Discussed posture ergonomics and different changes throughout the day that can be beneficial.  Follow-up again in 6 to 8 weeks

## 2020-02-09 NOTE — Patient Instructions (Addendum)
Good to see you Knees look great Did OMT See me in 7-8 weeks

## 2020-02-14 ENCOUNTER — Ambulatory Visit: Payer: Federal, State, Local not specified - PPO | Admitting: Family Medicine

## 2020-02-19 ENCOUNTER — Other Ambulatory Visit: Payer: Medicare Other

## 2020-02-19 DIAGNOSIS — Z20822 Contact with and (suspected) exposure to covid-19: Secondary | ICD-10-CM

## 2020-02-22 LAB — NOVEL CORONAVIRUS, NAA: SARS-CoV-2, NAA: NOT DETECTED

## 2020-03-29 NOTE — Progress Notes (Signed)
[place] Phone: [phone] Subjective:   [name], am serving as a scribe for Dr. Hulan Saas. This visit occurred during the SARS-CoV-2 public health emergency.  Safety protocols were in place, including screening questions prior to the visit, additional usage of staff PPE, and extensive cleaning of exam room while observing appropriate contact time as indicated for disinfecting solutions.   I'm seeing this patient by the request  of:  Orma Flaming, MD  CC: Back and neck pain follow-up, knee pain follow-up  [id number]  Kimberly [name] is a 70 y.o. female coming in with complaint of back and neck pain. OMT 02/09/2020. Patient states that her back pain is at around 2/10.  Nothing severe.  Feels like the manipulation has been helpful.  Has been able to stay fairly active.  Both knees are bothering her for past 4 weeks. Had Monovisc in November 2021. Would like to try injections again.  Patient states that this is affecting her daily activities more than anything else.  Has not noticed any swelling.  Wanting to know if the Baker's cyst is potentially back.  Medications patient has been prescribed: Gabapentin        Reviewed prior external information including notes and imaging from previsou exam, outside providers and external EMR if available.   As well as notes that were available from care everywhere and other healthcare systems.  Past medical history, social, surgical and family history all reviewed in electronic medical record.  No pertanent information unless stated regarding to the chief complaint.   Past Medical History:  Diagnosis Date  . Arthritis   . Cataract   . Diverticulosis   . Meniere's disease, bilateral   . Osteopenia     Allergies  Allergen Reactions  . Erythromycin Diarrhea  . Clindamycin/Lincomycin Nausea And Vomiting  . Morphine And Related Itching  . Latex Rash      Review of Systems:  No headache, visual changes, nausea, vomiting, diarrhea, constipation, dizziness, abdominal pain, skin rash, fevers, chills, night sweats, weight loss, swollen lymph nodes, body aches, joint swelling, chest pain, shortness of breath, mood changes. POSITIVE muscle aches  Objective  Blood pressure 120/74, pulse 89, height 5\' 8"  (1.727 m), weight 183 lb (83 kg), SpO2 97 %.   General: No apparent distress alert and oriented x3 mood and affect normal, dressed appropriately.  HEENT: Pupils equal, extraocular movements intact  Respiratory: Patient's speak in full sentences and does not appear short of breath  Cardiovascular: No lower extremity edema, non tender, no erythema  Gait normal with good balance and coordination.  MSK: Back -low back exam shows the patient does have some mild loss of lordosis.  Tightness noted in the thoracolumbar juncture tightness around the sacroiliac joint bilaterally as well. Knee: Bilateral valgus deformity noted.  Abnormal thigh to calf ratio.  Tender to palpation over medial and PF joint line.  ROM full in flexion and extension and lower leg rotation. instability with valgus force.  painful patellar compression. Patellar glide with moderate crepitus. Patellar and quadriceps tendons unremarkable. Hamstring and quadriceps strength is normal.    Osteopathic findings   T6 extended rotated and side bent right L4 flexed rotated and side bent right Sacrum right on right   After informed written and verbal consent, patient was seated on exam table. Right knee was prepped with alcohol swab and utilizing anterolateral approach, patient's right knee space was injected with 4:1  marcaine 0.5%:  Kenalog 40mg /dL. Patient tolerated the procedure well without immediate complications.  After informed written and verbal consent, patient was seated on exam table. Left knee was prepped with alcohol swab and utilizing anterolateral approach,  patient's left knee space was injected with 4:1  marcaine 0.5%: Kenalog 40mg /dL. Patient tolerated the procedure well without immediate complications.    Assessment and Plan:  Low back pain potentially associated with spinal stenosis Patient is responding relatively well to the osteopathic manipulation.  Discussed starting formal physical therapy again.  Patient has responded well to dry needling previously and would like to continue if possible.  Discussed icing regimen and home exercises.  Follow-up with me again in 4 to 8 weeks  Degenerative arthritis of knee, bilateral Chronic problem with worsening exacerbation.  Known arthritic changes in the knees bilaterally.  Discussed with patient about the possibility of viscosupplementation again at the 51-month.  Patient will follow up with me again in 6 weeks for her back but then will follow-up again in 3 months for the viscosupplementation of the knees.  Patient wants to avoid any surgical intervention.    Nonallopathic problems  Decision today to treat with OMT was based on Physical Exam  After verbal consent patient was treated with HVLA, ME, FPR techniques in  thoracic, lumbar, and sacral  areas  Patient tolerated the procedure well with improvement in symptoms  Patient given exercises, stretches and lifestyle modifications  See medications in patient instructions if given  Patient will follow up in 4-8 weeks      The above documentation has been reviewed and is accurate and complete Lyndal Pulley, DO       Note: This dictation was prepared with Dragon dictation along with smaller phrase technology. Any transcriptional errors that result from this process are unintentional.

## 2020-03-30 ENCOUNTER — Encounter: Payer: Self-pay | Admitting: Family Medicine

## 2020-03-30 ENCOUNTER — Ambulatory Visit (INDEPENDENT_AMBULATORY_CARE_PROVIDER_SITE_OTHER): Payer: Medicare Other | Admitting: Family Medicine

## 2020-03-30 ENCOUNTER — Other Ambulatory Visit: Payer: Self-pay

## 2020-03-30 VITALS — BP 120/74 | HR 89 | Ht 68.0 in | Wt 183.0 lb

## 2020-03-30 DIAGNOSIS — M999 Biomechanical lesion, unspecified: Secondary | ICD-10-CM

## 2020-03-30 DIAGNOSIS — M545 Low back pain, unspecified: Secondary | ICD-10-CM

## 2020-03-30 DIAGNOSIS — M170 Bilateral primary osteoarthritis of knee: Secondary | ICD-10-CM

## 2020-03-30 NOTE — Assessment & Plan Note (Signed)
Chronic problem with worsening exacerbation.  Known arthritic changes in the knees bilaterally.  Discussed with patient about the possibility of viscosupplementation again at the 83-month.  Patient will follow up with me again in 6 weeks for her back but then will follow-up again in 3 months for the viscosupplementation of the knees.  Patient wants to avoid any surgical intervention.

## 2020-03-30 NOTE — Assessment & Plan Note (Signed)
Patient is responding relatively well to the osteopathic manipulation.  Discussed starting formal physical therapy again.  Patient has responded well to dry needling previously and would like to continue if possible.  Discussed icing regimen and home exercises.  Follow-up with me again in 4 to 8 weeks

## 2020-03-30 NOTE — Patient Instructions (Addendum)
Injected knees today Will get gel approval See me again May 4th or after for gel

## 2020-04-11 ENCOUNTER — Encounter: Payer: Self-pay | Admitting: Physical Therapy

## 2020-04-11 ENCOUNTER — Other Ambulatory Visit: Payer: Self-pay

## 2020-04-11 ENCOUNTER — Ambulatory Visit (INDEPENDENT_AMBULATORY_CARE_PROVIDER_SITE_OTHER): Payer: Medicare Other | Admitting: Physical Therapy

## 2020-04-11 DIAGNOSIS — M545 Low back pain, unspecified: Secondary | ICD-10-CM

## 2020-04-11 DIAGNOSIS — G8929 Other chronic pain: Secondary | ICD-10-CM

## 2020-04-11 NOTE — Patient Instructions (Signed)
Access Code: [email or web address] URL: [email or web address] Date: 04/11/2020 Prepared by: Lyndee Hensen  Exercises Standing Sidebending with Chair Support - 2 x daily - 3 reps - 30 hold Gastroc Stretch on Wall - 2 x daily - 3 sets - 30 hold Seated Hamstring Stretch - 2 x daily - 3 sets - 60 hold Seated Knee Extension AROM - 1 x daily - 2 sets - 10 reps Supine Posterior Pelvic Tilt - 2 x daily - 1 sets - 10 reps Supine Single Knee to Chest Stretch - 2 x daily - 60 hold - 3 sets Supine Piriformis Stretch with Leg Straight - 2 x daily - 60 hold - 3 sets Straight Leg Raise - 1 x daily - 1 sets - 10 reps Hooklying Clamshell with Resistance - 1 x daily - 2 sets - 10 reps

## 2020-04-16 ENCOUNTER — Encounter: Payer: Self-pay | Admitting: Physical Therapy

## 2020-04-16 NOTE — Therapy (Signed)
[facility] [address] [place], [place], [address] Phone: [phone]   Fax:  [phone]  Physical Therapy Evaluation  Patient Details  Name: Kimberly [name] MRN: [id number] Date of Birth: 06/24/1950 Referring Provider (PT): [name]   Encounter Date: 04/11/2020   PT End of Session - 04/16/20 0717    Visit Number 1    Number of Visits 12    Date for PT Re-Evaluation 05/23/20    Authorization Type Medicare, no other visits in 2022    PT Start Time 1300    PT Stop Time 1340    PT Time Calculation (min) 40 min    Activity Tolerance Patient tolerated treatment well    Behavior During Therapy [facility] for tasks assessed/performed           Past Medical History:  Diagnosis Date  . Arthritis   . Cataract   . Diverticulosis   . Meniere's disease, bilateral   . Osteopenia     Past Surgical History:  Procedure Laterality Date  . ABDOMINAL HYSTERECTOMY    . CHOLECYSTECTOMY    . EYE SURGERY    . THYROIDECTOMY, PARTIAL      There were no vitals filed for this visit.    Subjective Assessment - 04/16/20 0713    Subjective Pt states chronic low back pain. She gets regular manipulation that she feels is helpful. Has had PT for knees int he past, and has been doing a few stretches for her back. Pt currently not doing anything for knees doesnt want to "make anything hurt". Pt state increased pain in back with sitting for a while, improved when standing/walking.    Pertinent History L scoliosis,    Limitations Lifting;Standing;Walking;House hold activities    Patient Stated Goals decreased pain    Currently in Pain? Yes    Pain Score 3     Pain Location Back    Pain Orientation Right;Left    Pain Descriptors / Indicators Aching    Pain Type Chronic pain    Pain Radiating Towards into R hip/glute    Pain Onset More than a month ago    Pain Frequency Intermittent    Aggravating Factors  sitting,    Pain Relieving Factors standing     Pain Score 5    Pain Location Knee    Pain Orientation Right;Left    Pain Descriptors / Indicators Aching    Pain Type Chronic pain    Pain Onset More than a month ago    Pain Frequency Intermittent              [facility] PT Assessment - 04/16/20 0001      Assessment   Medical Diagnosis Low Back pain, knee pain    Referring Provider (PT) [name]    Prior Therapy no      Balance Screen   Has the patient fallen in the past 6 months No      Prior Function   Level of Independence Independent      Cognition   Overall Cognitive Status Within Functional Limits for tasks assessed      Posture/Postural Control   Posture Comments Scoliosis,  L shoulder lower, L Le longer in supine      AROM   Lumbar Flexion mild limitation    Lumbar Extension mod limitation    Lumbar - Right Side Bend Mod limitation/ pain    Lumbar - Left Side Bend mod limitation/ pain  Strength   Overall Strength Comments Hips: 4-/5, Knees: 4/5 , Core: 3/5       Palpation   Palpation comment Pain in Bil R>L low lumbar region, central and in paraspinals,; Pain in R glute med and piriformis ; tightness/limitation in bil quads and hip flexors.      Special Tests   Other special tests Neg SLR                      Objective measurements completed on examination: See above findings.       [facility] Adult PT Treatment/Exercise - 04/16/20 0001      Lumbar Exercises: Stretches   Single Knee to Chest Stretch 3 reps;30 seconds    Piriformis Stretch 3 reps;30 seconds    Piriformis Stretch Limitations supine mod fig 4;    Other Lumbar Stretch Exercise Standing QL/SB stretch 30 sec x 3;                   PT Education - 04/16/20 0716    Education Details Reviewed previous HEP, discussed which exercises to focus on at this time for LBP    Person(s) Educated Patient    Methods Explanation;Demonstration;Tactile cues;Verbal cues;Handout    Comprehension Verbalized understanding;Returned  demonstration;Verbal cues required;Tactile cues required;Need further instruction            PT Short Term Goals - 04/16/20 0718      PT SHORT TERM GOAL #1   Title Pt to be independent with initial HEP    Time 2    Period Weeks    Status New    Target Date 04/25/20             PT Long Term Goals - 04/16/20 0722      PT LONG TERM GOAL #1   Title Pt to be independent with final HEP    Time 6    Period Weeks    Status New    Target Date 05/23/20      PT LONG TERM GOAL #2   Title Pt to report decreased pain in lumbar region to 0-2/10 with activity    Time 6    Period Weeks    Status New    Target Date 05/23/20      PT LONG TERM GOAL #3   Title Pt to demo improved lumbar ROM to be [facility] and pain free, to improve ability for ADLs.    Time 6    Period Weeks    Status New    Target Date 05/23/20      PT LONG TERM GOAL #4   Title Pt to demo improved strength of Bil hips and knees to at least 4+/5 to improve pain and stability    Time 6    Period Weeks    Status New    Target Date 05/23/20                  Plan - 04/16/20 0727    Clinical Impression Statement Pt with primary complaint of chronic low back pain. Pt to benefit from max education on benefits of spine mobility and strength, and HEP. Pt with some hesitation for movmement/exercise because of knee pain. Pt with some ROM limitations as well as scoliosis. She also has weakness in hips, quads, and core. Pt with decreased ability for full functional actvitieis due to pain, and will benefti from skilled PT to improve.    Personal Factors and Comorbidities Fitness;Time  since onset of injury/illness/exacerbation;Comorbidity 1    Comorbidities Scoliosis, B knee pain    Examination-Activity Limitations Locomotion Level;Bend;Lift;Stand;Stairs;Squat    Examination-Participation Restrictions Cleaning;Meal Prep;Yard Work;Community Activity;Laundry;Shop;Driving    Stability/Clinical Decision Making  Stable/Uncomplicated    Clinical Decision Making Low    Rehab Potential Good    PT Frequency 2x / week    PT Duration 6 weeks    PT Treatment/Interventions ADLs/Self Care Home Management;Cryotherapy;Electrical Stimulation;Ultrasound;Traction;Moist Heat;Iontophoresis 4mg /ml Dexamethasone;Gait training;Stair training;Functional mobility training;Therapeutic activities;Therapeutic exercise;Balance training;Orthotic Fit/Training;Patient/family education;Neuromuscular re-education;Manual techniques;Passive range of motion;Dry needling;Vasopneumatic Device;Joint Manipulations;Spinal Manipulations    Consulted and Agree with Plan of Care Patient           Patient will benefit from skilled therapeutic intervention in order to improve the following deficits and impairments:  Abnormal gait,Pain,Improper body mechanics,Postural dysfunction,Increased muscle spasms,Decreased mobility,Decreased activity tolerance,Decreased range of motion,Decreased strength,Impaired flexibility  Visit Diagnosis: Chronic right-sided low back pain without sciatica     Problem List Patient Active Problem List   Diagnosis Date Noted  . Degenerative arthritis of knee, bilateral 12/15/2019  . Degenerative joint disease of knee, right 11/29/2019  . Baker's cyst of knee, right 09/28/2019  . Hyperlipidemia, mixed 09/01/2019  . Nonallopathic lesion of lumbar region 08/10/2019  . Nonallopathic lesion of thoracic region 08/10/2019  . Nonallopathic lesion of sacral region 08/10/2019  . Partial tear of right hamstring 07/13/2019  . Meniere's disease of both ears 10/05/2018  . Osteopenia 10/05/2018  . Low back pain potentially associated with spinal stenosis 10/05/2018    Lyndee Hensen, PT, DPT 7:34 AM  04/16/20    [facility] [place], [place], [address] Phone: [phone]   Fax:  [phone]  Name: Kameran Lallier MRN: [id number] Date of Birth: April 01, 1950

## 2020-04-25 ENCOUNTER — Ambulatory Visit (INDEPENDENT_AMBULATORY_CARE_PROVIDER_SITE_OTHER): Payer: Medicare Other | Admitting: Physical Therapy

## 2020-04-25 ENCOUNTER — Other Ambulatory Visit: Payer: Self-pay

## 2020-04-25 DIAGNOSIS — G8929 Other chronic pain: Secondary | ICD-10-CM | POA: Diagnosis not present

## 2020-04-25 DIAGNOSIS — M545 Low back pain, unspecified: Secondary | ICD-10-CM

## 2020-04-27 ENCOUNTER — Encounter: Payer: Self-pay | Admitting: Physical Therapy

## 2020-04-27 NOTE — Therapy (Signed)
[facility] [address] [place], [place], [address] Phone: [phone]   Fax:  [phone]  Physical Therapy Treatment  Patient Details  Name: Kimberly [name] MRN: [id number] Date of Birth: 25-Nov-1950 Referring Provider (PT): [name]   Encounter Date: 04/25/2020   PT End of Session - 04/27/20 1245    Visit Number 2    Number of Visits 12    Date for PT Re-Evaluation 05/23/20    Authorization Type Medicare, no other visits in 2022    PT Start Time 1430    PT Stop Time 1510    PT Time Calculation (min) 40 min    Activity Tolerance Patient tolerated treatment well    Behavior During Therapy [facility] for tasks assessed/performed           Past Medical History:  Diagnosis Date  . Arthritis   . Cataract   . Diverticulosis   . Meniere's disease, bilateral   . Osteopenia     Past Surgical History:  Procedure Laterality Date  . ABDOMINAL HYSTERECTOMY    . CHOLECYSTECTOMY    . EYE SURGERY    . THYROIDECTOMY, PARTIAL      There were no vitals filed for this visit.   Subjective Assessment - 04/27/20 1243    Subjective Pt states back has not been too painful. She admittedly has not done much of HEP, has been at the beach.    Currently in Pain? Yes    Pain Score 1     Pain Location Back    Pain Orientation Right;Left    Pain Descriptors / Indicators Aching    Pain Type Chronic pain    Pain Onset More than a month ago    Pain Frequency Intermittent                             [facility] Adult PT Treatment/Exercise - 04/27/20 0001      Posture/Postural Control   Posture Comments Scoliosis,  L shoulder lower, L Le longer in supine      Lumbar Exercises: Stretches   Single Knee to Chest Stretch 3 reps;30 seconds    Pelvic Tilt 20 reps    Other Lumbar Stretch Exercise Standing QL/SB stretch 30 sec x 3;       Lumbar Exercises: Standing   Row 20 reps    Theraband Level (Row) Level 3 (Green)    Other Standing  Lumbar Exercises R SB x 10;      Lumbar Exercises: Supine   Bent Knee Raise 20 reps    Bent Knee Raise Limitations RTB with TA    Bridge 20 reps    Straight Leg Raise 10 reps      Manual Therapy   Joint Mobilization LUmbar PA mobs, SI mobs    Soft tissue mobilization STM, TPR to bil lumbar paraspinals into R glute                  PT Education - 04/27/20 1244    Education Details Reviewed HEP and its importance for management of chronic back pain.    Person(s) Educated Patient    Methods Explanation;Tactile cues;Verbal cues;Demonstration;Handout    Comprehension Verbalized understanding;Returned demonstration;Verbal cues required;Tactile cues required;Need further instruction            PT Short Term Goals - 04/16/20 0718      PT SHORT TERM GOAL #1   Title Pt to be independent  with initial HEP    Time 2    Period Weeks    Status New    Target Date 04/25/20             PT Long Term Goals - 04/16/20 0722      PT LONG TERM GOAL #1   Title Pt to be independent with final HEP    Time 6    Period Weeks    Status New    Target Date 05/23/20      PT LONG TERM GOAL #2   Title Pt to report decreased pain in lumbar region to 0-2/10 with activity    Time 6    Period Weeks    Status New    Target Date 05/23/20      PT LONG TERM GOAL #3   Title Pt to demo improved lumbar ROM to be [facility] and pain free, to improve ability for ADLs.    Time 6    Period Weeks    Status New    Target Date 05/23/20      PT LONG TERM GOAL #4   Title Pt to demo improved strength of Bil hips and knees to at least 4+/5 to improve pain and stability    Time 6    Period Weeks    Status New    Target Date 05/23/20                 Plan - 04/27/20 1246    Clinical Impression Statement Pt asking if she should continue PT, discussed benefits of continuing for decreasing ongoing pain in back, as well as education on home management of pain. Discussed benefit of continued care. Pt  with soreness in R low back and into R glue today, less pain in glute than previously.    Personal Factors and Comorbidities Fitness;Time since onset of injury/illness/exacerbation;Comorbidity 1    Comorbidities Scoliosis, B knee pain    Examination-Activity Limitations Locomotion Level;Bend;Lift;Stand;Stairs;Squat    Examination-Participation Restrictions Cleaning;Meal Prep;Yard Work;Community Activity;Laundry;Shop;Driving    Stability/Clinical Decision Making Stable/Uncomplicated    Rehab Potential Good    PT Frequency 2x / week    PT Duration 6 weeks    PT Treatment/Interventions ADLs/Self Care Home Management;Cryotherapy;Electrical Stimulation;Ultrasound;Traction;Moist Heat;Iontophoresis 4mg /ml Dexamethasone;Gait training;Stair training;Functional mobility training;Therapeutic activities;Therapeutic exercise;Balance training;Orthotic Fit/Training;Patient/family education;Neuromuscular re-education;Manual techniques;Passive range of motion;Dry needling;Vasopneumatic Device;Joint Manipulations;Spinal Manipulations    Consulted and Agree with Plan of Care Patient           Patient will benefit from skilled therapeutic intervention in order to improve the following deficits and impairments:  Abnormal gait,Pain,Improper body mechanics,Postural dysfunction,Increased muscle spasms,Decreased mobility,Decreased activity tolerance,Decreased range of motion,Decreased strength,Impaired flexibility  Visit Diagnosis: Chronic right-sided low back pain without sciatica     Problem List Patient Active Problem List   Diagnosis Date Noted  . Degenerative arthritis of knee, bilateral 12/15/2019  . Degenerative joint disease of knee, right 11/29/2019  . Baker's cyst of knee, right 09/28/2019  . Hyperlipidemia, mixed 09/01/2019  . Nonallopathic lesion of lumbar region 08/10/2019  . Nonallopathic lesion of thoracic region 08/10/2019  . Nonallopathic lesion of sacral region 08/10/2019  . Partial tear  of right hamstring 07/13/2019  . Meniere's disease of both ears 10/05/2018  . Osteopenia 10/05/2018  . Low back pain potentially associated with spinal stenosis 10/05/2018    Lyndee Hensen, PT, DPT 12:51 PM  04/27/20    [place], [place], [address] Phone: [phone]  Fax:  [phone]  Name: Kimberly [name] MRN: [id number] Date of Birth: February 18, 1950

## 2020-05-02 ENCOUNTER — Other Ambulatory Visit: Payer: Self-pay

## 2020-05-02 ENCOUNTER — Ambulatory Visit (INDEPENDENT_AMBULATORY_CARE_PROVIDER_SITE_OTHER): Payer: Medicare Other | Admitting: Physical Therapy

## 2020-05-02 ENCOUNTER — Encounter: Payer: Self-pay | Admitting: Physical Therapy

## 2020-05-02 DIAGNOSIS — G8929 Other chronic pain: Secondary | ICD-10-CM

## 2020-05-02 DIAGNOSIS — M545 Low back pain, unspecified: Secondary | ICD-10-CM | POA: Diagnosis not present

## 2020-05-07 ENCOUNTER — Encounter: Payer: Self-pay | Admitting: Physical Therapy

## 2020-05-07 NOTE — Therapy (Signed)
[facility] [address] [place], [place], [address] Phone: [phone]   Fax:  [phone]  Physical Therapy Treatment  Patient Details  Name: Kimberly [name] MRN: [id number] Date of Birth: 11/19/1950 Referring Provider (PT): [name]   Encounter Date: 05/02/2020   PT End of Session - 05/07/20 2138    Visit Number 3    Number of Visits 12    Date for PT Re-Evaluation 05/23/20    Authorization Type Medicare, no other visits in 2022    PT Start Time 1347    PT Stop Time 1428    PT Time Calculation (min) 41 min    Activity Tolerance Patient tolerated treatment well    Behavior During Therapy [facility] for tasks assessed/performed           Past Medical History:  Diagnosis Date  . Arthritis   . Cataract   . Diverticulosis   . Meniere's disease, bilateral   . Osteopenia     Past Surgical History:  Procedure Laterality Date  . ABDOMINAL HYSTERECTOMY    . CHOLECYSTECTOMY    . EYE SURGERY    . THYROIDECTOMY, PARTIAL      There were no vitals filed for this visit.   Subjective Assessment - 05/07/20 2138    Subjective Pt with no new complaints. Notes minimal back pain today and in last few days.    Currently in Pain? No/denies    Pain Score 0-No pain                             [facility] Adult PT Treatment/Exercise - 05/07/20 0001      Posture/Postural Control   Posture Comments Scoliosis,  L shoulder lower, L Le longer in supine      Exercises   Exercises Lumbar      Lumbar Exercises: Stretches   Active Hamstring Stretch 3 reps;30 seconds   seated   Single Knee to Chest Stretch 3 reps;30 seconds    Pelvic Tilt 20 reps    Other Lumbar Stretch Exercise Seated fwd flexion l/R, center x 2 min;    Other Lumbar Stretch Exercise Standing QL/SB stretch 30 sec x 3;       Lumbar Exercises: Standing   Functional Squats 10 reps    Lifting From 12";5 reps    Lifting Weights (lbs) 10    Row 20 reps    Theraband  Level (Row) Level 3 (Green)      Lumbar Exercises: Supine   Clam Limitations x20 GTB    Bent Knee Raise 20 reps    Bent Knee Raise Limitations RTB with TA    Bridge 20 reps      Manual Therapy   Joint Mobilization LUmbar PA mobs, SI mobs    Soft tissue mobilization STM, TPR to bil lumbar paraspinals                  PT Education - 05/07/20 2138    Education Details Reviewed HEP    Person(s) Educated Patient    Methods Explanation;Demonstration;Verbal cues    Comprehension Verbalized understanding;Returned demonstration;Verbal cues required            PT Short Term Goals - 04/16/20 0718      PT SHORT TERM GOAL #1   Title Pt to be independent with initial HEP    Time 2    Period Weeks    Status New  Target Date 04/25/20             PT Long Term Goals - 04/16/20 0722      PT LONG TERM GOAL #1   Title Pt to be independent with final HEP    Time 6    Period Weeks    Status New    Target Date 05/23/20      PT LONG TERM GOAL #2   Title Pt to report decreased pain in lumbar region to 0-2/10 with activity    Time 6    Period Weeks    Status New    Target Date 05/23/20      PT LONG TERM GOAL #3   Title Pt to demo improved lumbar ROM to be [facility] and pain free, to improve ability for ADLs.    Time 6    Period Weeks    Status New    Target Date 05/23/20      PT LONG TERM GOAL #4   Title Pt to demo improved strength of Bil hips and knees to at least 4+/5 to improve pain and stability    Time 6    Period Weeks    Status New    Target Date 05/23/20                 Plan - 05/07/20 2139    Clinical Impression Statement Pt with minimal pain during session today. Improving compliance with HEP for daily lumbar mobility. Pt also improving with ability for light strengthening and ther ex. Plan to progress sterngth as tolerated.    Personal Factors and Comorbidities Fitness;Time since onset of injury/illness/exacerbation;Comorbidity 1    Comorbidities  Scoliosis, B knee pain    Examination-Activity Limitations Locomotion Level;Bend;Lift;Stand;Stairs;Squat    Examination-Participation Restrictions Cleaning;Meal Prep;Yard Work;Community Activity;Laundry;Shop;Driving    Stability/Clinical Decision Making Stable/Uncomplicated    Rehab Potential Good    PT Frequency 2x / week    PT Duration 6 weeks    PT Treatment/Interventions ADLs/Self Care Home Management;Cryotherapy;Electrical Stimulation;Ultrasound;Traction;Moist Heat;Iontophoresis 4mg /ml Dexamethasone;Gait training;Stair training;Functional mobility training;Therapeutic activities;Therapeutic exercise;Balance training;Orthotic Fit/Training;Patient/family education;Neuromuscular re-education;Manual techniques;Passive range of motion;Dry needling;Vasopneumatic Device;Joint Manipulations;Spinal Manipulations    Consulted and Agree with Plan of Care Patient           Patient will benefit from skilled therapeutic intervention in order to improve the following deficits and impairments:  Abnormal gait,Pain,Improper body mechanics,Postural dysfunction,Increased muscle spasms,Decreased mobility,Decreased activity tolerance,Decreased range of motion,Decreased strength,Impaired flexibility  Visit Diagnosis: Chronic right-sided low back pain without sciatica     Problem List Patient Active Problem List   Diagnosis Date Noted  . Degenerative arthritis of knee, bilateral 12/15/2019  . Degenerative joint disease of knee, right 11/29/2019  . Baker's cyst of knee, right 09/28/2019  . Hyperlipidemia, mixed 09/01/2019  . Nonallopathic lesion of lumbar region 08/10/2019  . Nonallopathic lesion of thoracic region 08/10/2019  . Nonallopathic lesion of sacral region 08/10/2019  . Partial tear of right hamstring 07/13/2019  . Meniere's disease of both ears 10/05/2018  . Osteopenia 10/05/2018  . Low back pain potentially associated with spinal stenosis 10/05/2018    Lyndee Hensen, PT, DPT 9:40 PM   05/07/20    [facility] [place], [place], [address] Phone: [phone]   Fax:  [phone]  Name: Kimberly [name] MRN: [id number] Date of Birth: [birth year]

## 2020-05-08 ENCOUNTER — Encounter: Payer: Self-pay | Admitting: Family Medicine

## 2020-05-09 ENCOUNTER — Ambulatory Visit (INDEPENDENT_AMBULATORY_CARE_PROVIDER_SITE_OTHER): Payer: Medicare Other | Admitting: Physical Therapy

## 2020-05-09 ENCOUNTER — Encounter: Payer: Self-pay | Admitting: Physical Therapy

## 2020-05-09 ENCOUNTER — Other Ambulatory Visit: Payer: Self-pay

## 2020-05-09 DIAGNOSIS — M545 Low back pain, unspecified: Secondary | ICD-10-CM

## 2020-05-09 DIAGNOSIS — G8929 Other chronic pain: Secondary | ICD-10-CM | POA: Diagnosis not present

## 2020-05-09 NOTE — Therapy (Signed)
[facility] [address] [place], [place], [address] Phone: [phone]   Fax:  [phone]  Physical Therapy Treatment  Patient Details  Name: Kimberly [name] MRN: [id number] Date of Birth: 27-Feb-1950 Referring Provider (PT): [name]   Encounter Date: 05/09/2020   PT End of Session - 05/09/20 1328    Visit Number 4    Number of Visits 12    Date for PT Re-Evaluation 05/23/20    Authorization Type Medicare, no other visits in 2022    PT Start Time 1311    PT Stop Time 1344    PT Time Calculation (min) 33 min    Activity Tolerance Patient tolerated treatment well    Behavior During Therapy [facility] for tasks assessed/performed           Past Medical History:  Diagnosis Date  . Arthritis   . Cataract   . Diverticulosis   . Meniere's disease, bilateral   . Osteopenia     Past Surgical History:  Procedure Laterality Date  . ABDOMINAL HYSTERECTOMY    . CHOLECYSTECTOMY    . EYE SURGERY    . THYROIDECTOMY, PARTIAL      There were no vitals filed for this visit.   Subjective Assessment - 05/09/20 1324    Subjective Pt states back felt very good after last visit, and has felt very good. She does report increased dizziness since Friday. Is a small amount better, but still dizzy in mornings. She is seeing PCP on Thursday. Also reports fullness in head, and mild pain in R ear. She was diagnosed with menieres many years ago, but has not had trouble with that since. Not very dizzy today.    Currently in Pain? No/denies    Pain Score 0-No pain                             [facility] Adult PT Treatment/Exercise - 05/09/20 0001      Posture/Postural Control   Posture Comments Scoliosis,  L shoulder lower, L Le longer in supine      Exercises   Exercises Lumbar      Lumbar Exercises: Stretches   Active Hamstring Stretch 3 reps;30 seconds   seated   Single Knee to Chest Stretch 3 reps;30 seconds    Pelvic Tilt 20 reps     Other Lumbar Stretch Exercise --    Other Lumbar Stretch Exercise Standing QL/SB stretch 30 sec x 3;       Lumbar Exercises: Standing   Functional Squats --    Lifting --    Lifting Weights (lbs) --    Row 20 reps    Theraband Level (Row) Level 3 (Green)    Other Standing Lumbar Exercises R SB x 10;      Lumbar Exercises: Seated   Sit to Stand 10 reps      Lumbar Exercises: Supine   Clam Limitations --    Bent Knee Raise 20 reps    Bent Knee Raise Limitations RTB with TA    Bridge 20 reps      Manual Therapy   Joint Mobilization long leg distaction x 2 min bil; for lumbar pump    Soft tissue mobilization --                    PT Short Term Goals - 04/16/20 0718      PT SHORT TERM GOAL #1  Title Pt to be independent with initial HEP    Time 2    Period Weeks    Status New    Target Date 04/25/20             PT Long Term Goals - 04/16/20 0722      PT LONG TERM GOAL #1   Title Pt to be independent with final HEP    Time 6    Period Weeks    Status New    Target Date 05/23/20      PT LONG TERM GOAL #2   Title Pt to report decreased pain in lumbar region to 0-2/10 with activity    Time 6    Period Weeks    Status New    Target Date 05/23/20      PT LONG TERM GOAL #3   Title Pt to demo improved lumbar ROM to be [facility] and pain free, to improve ability for ADLs.    Time 6    Period Weeks    Status New    Target Date 05/23/20      PT LONG TERM GOAL #4   Title Pt to demo improved strength of Bil hips and knees to at least 4+/5 to improve pain and stability    Time 6    Period Weeks    Status New    Target Date 05/23/20                 Plan - 05/09/20 1407    Clinical Impression Statement Pt with improving back pain, doing well with this, and has done better with HEP as well. She has new onset of dizziness, having MD appt Thursday. Will f/u with pt after this, may require vestibular rehab if deemed dizziness from vertigo. Decreased  activity done today as to not aggravate her symptoms with head and position changes.    Personal Factors and Comorbidities Fitness;Time since onset of injury/illness/exacerbation;Comorbidity 1    Comorbidities Scoliosis, B knee pain    Examination-Activity Limitations Locomotion Level;Bend;Lift;Stand;Stairs;Squat    Examination-Participation Restrictions Cleaning;Meal Prep;Yard Work;Community Activity;Laundry;Shop;Driving    Stability/Clinical Decision Making Stable/Uncomplicated    Rehab Potential Good    PT Frequency 2x / week    PT Duration 6 weeks    PT Treatment/Interventions ADLs/Self Care Home Management;Cryotherapy;Electrical Stimulation;Ultrasound;Traction;Moist Heat;Iontophoresis 4mg /ml Dexamethasone;Gait training;Stair training;Functional mobility training;Therapeutic activities;Therapeutic exercise;Balance training;Orthotic Fit/Training;Patient/family education;Neuromuscular re-education;Manual techniques;Passive range of motion;Dry needling;Vasopneumatic Device;Joint Manipulations;Spinal Manipulations    Consulted and Agree with Plan of Care Patient           Patient will benefit from skilled therapeutic intervention in order to improve the following deficits and impairments:  Abnormal gait,Pain,Improper body mechanics,Postural dysfunction,Increased muscle spasms,Decreased mobility,Decreased activity tolerance,Decreased range of motion,Decreased strength,Impaired flexibility  Visit Diagnosis: Chronic right-sided low back pain without sciatica     Problem List Patient Active Problem List   Diagnosis Date Noted  . Degenerative arthritis of knee, bilateral 12/15/2019  . Degenerative joint disease of knee, right 11/29/2019  . Baker's cyst of knee, right 09/28/2019  . Hyperlipidemia, mixed 09/01/2019  . Nonallopathic lesion of lumbar region 08/10/2019  . Nonallopathic lesion of thoracic region 08/10/2019  . Nonallopathic lesion of sacral region 08/10/2019  . Partial tear of  right hamstring 07/13/2019  . Meniere's disease of both ears 10/05/2018  . Osteopenia 10/05/2018  . Low back pain potentially associated with spinal stenosis 10/05/2018    Lyndee Hensen, PT, DPT 2:13 PM  05/09/20    [facility]  [place], [place], [address] Phone: [phone]   Fax:  [phone]  Name: Kaysa Roulhac MRN: [id number] Date of Birth: 02-Aug-1950

## 2020-05-11 ENCOUNTER — Encounter: Payer: Self-pay | Admitting: Family Medicine

## 2020-05-11 ENCOUNTER — Other Ambulatory Visit: Payer: Self-pay

## 2020-05-11 ENCOUNTER — Ambulatory Visit: Payer: Medicare Other | Admitting: Family Medicine

## 2020-05-11 ENCOUNTER — Ambulatory Visit (INDEPENDENT_AMBULATORY_CARE_PROVIDER_SITE_OTHER): Payer: Medicare Other | Admitting: Family Medicine

## 2020-05-11 VITALS — BP 130/80 | HR 78 | Temp 98.7°F | Ht 68.0 in | Wt 184.6 lb

## 2020-05-11 DIAGNOSIS — R42 Dizziness and giddiness: Secondary | ICD-10-CM | POA: Diagnosis not present

## 2020-05-11 MED ORDER — MECLIZINE HCL 25 MG PO TABS: 25.0000 mg | ORAL_TABLET | Freq: Three times a day (TID) | ORAL | 0 refills | Status: DC | PRN

## 2020-05-11 MED ORDER — FLUCONAZOLE 150 MG PO TABS: 150.0000 mg | ORAL_TABLET | Freq: Once | ORAL | 0 refills | Status: AC

## 2020-05-11 MED ORDER — AMOXICILLIN-POT CLAVULANATE 875-125 MG PO TABS: 1.0000 | ORAL_TABLET | Freq: Two times a day (BID) | ORAL | 0 refills | Status: DC

## 2020-05-11 NOTE — Progress Notes (Signed)
Patient: Kimberly [name] MRN: [id number] DOB: 1950-03-13 PCP: Orma Flaming, MD     Subjective:  Chief Complaint  Patient presents with  . Dizziness    Symptoms started last Friday morning when getting out of bed.     HPI: The patient is a 70 y.o. female who presents today for dizziness. Starting last Friday morning when getting out of bed and she was so dizzy she could barely walk. She was diagnosed in 1993 with menieres disease. She was put on diuretic for a time and has been diet controlled since that time.  She doesn't have the same hearing loss as she did that time. She says it may be related to allergies, due to soreness in ears. She says that she all together does not feel good. She feels bad, her ears feel clogged and she is stuffy. Both ears hurt, but R>L. When she gets dizzy she has this with both positional changes and head turning. She can not walk straight. She has to hang onto walls. She is slightly nauseated. She feels like she can not focus her eyes, but no blurry or double vision. She doesn't have a headache, but feels sinus like. She feels like she is spinning and the world is standing still.  She has dizziness if eyes are open, but okay if she lays still. No ringing in her ears. She has had no syncope or falls.   Review of Systems  Constitutional: Negative for appetite change.  Eyes: Positive for pain.  Gastrointestinal: Positive for nausea.  Neurological: Positive for dizziness and headaches.    Allergies Patient is allergic to erythromycin, clindamycin/lincomycin, morphine and related, and latex.  Past Medical History Patient  has a past medical history of Arthritis, Cataract, Diverticulosis, Meniere's disease, bilateral, and Osteopenia.  Surgical History Patient  has a past surgical history that includes Thyroidectomy, partial; Abdominal hysterectomy; Cholecystectomy; and Eye surgery.  Family History Pateint's family history includes Colon polyps in her mother;  Heart disease in her father and mother; Stomach cancer in her paternal grandfather.  Social History Patient  reports that she has been smoking cigarettes. She has been smoking about 1.00 pack per day. She has never used smokeless tobacco. She reports current alcohol use of about 21.0 standard drinks of alcohol per week. She reports that she does not use drugs.    Objective: Vitals:   05/11/20 1248  BP: 130/80  Pulse: 78  Temp: 98.7 F (37.1 C)  TempSrc: Temporal  SpO2: 97%  Weight: 184 lb 9.6 oz (83.7 kg)  Height: 5\' 8"  (1.727 m)    Body mass index is 28.07 kg/m.  Physical Exam Vitals reviewed.  Constitutional:      Appearance: Normal appearance. She is normal weight.  HENT:     Head: Normocephalic and atraumatic.     Right Ear: Tympanic membrane, ear canal and external ear normal.     Left Ear: Tympanic membrane, ear canal and external ear normal.     Nose: Nose normal.     Mouth/Throat:     Mouth: Mucous membranes are moist.  Eyes:     Extraocular Movements: Extraocular movements intact.     Pupils: Pupils are equal, round, and reactive to light.  Cardiovascular:     Rate and Rhythm: Normal rate and regular rhythm.     Heart sounds: Normal heart sounds.  Pulmonary:     Effort: Pulmonary effort is normal.     Breath sounds: Normal breath sounds.  Abdominal:  General: Bowel sounds are normal.     Palpations: Abdomen is soft.  Musculoskeletal:     Cervical back: Normal range of motion and neck supple.  Skin:    General: Skin is warm.     Capillary Refill: Capillary refill takes less than 2 seconds.  Neurological:     General: No focal deficit present.     Mental Status: She is alert and oriented to person, place, and time.     Cranial Nerves: No cranial nerve deficit.     Sensory: No sensory deficit.     Comments: dix halpike negative bilaterally. Dizziness sitting up.  No nystagmus   Psychiatric:        Mood and Affect: Mood normal.        Behavior:  Behavior normal.        Assessment/plan: 1. Dizziness -orthostatics wnl and neuro exam wnl.  -starting her on flonase and continue her daily zyrtec.  -her dix halpike was negative, but sending home with exercises and meclizine.  -pocket px for augmentin for sinus infection although discussed no indication at this point. Gave parameters for use.  -she has shuffling gait and flat affect, but gait she states is due to dizziness. No tremors.  -if not better we will MRI, she knows to email me for worsening symptoms or not getting better.  -discussed menieres, but she has no hearing loss. Hold hctz at this time.      This visit occurred during the SARS-CoV-2 public health emergency.  Safety protocols were in place, including screening questions prior to the visit, additional usage of staff PPE, and extensive cleaning of exam room while observing appropriate contact time as indicated for disinfecting solutions.     Return if symptoms worsen or fail to improve.   Orma Flaming, MD [place]   05/11/2020

## 2020-05-11 NOTE — Patient Instructions (Addendum)
-  im going to treat you for vertigo like illness.  Your orthostatics are wnl.  -meclizine as needed for dizziness -exercises to do at home.  -if not better in 1-2 weeks we need to scan your head, so send me an email. I will order this.   -I also sent in an antibiotic to start if sinuses continue to feel bad. I do want you to start flonase and use at night to help with any allergy type symptoms.   Keep me posted...  Dr. Rogers Blocker

## 2020-05-12 ENCOUNTER — Encounter: Payer: Self-pay | Admitting: Family Medicine

## 2020-05-16 ENCOUNTER — Ambulatory Visit (INDEPENDENT_AMBULATORY_CARE_PROVIDER_SITE_OTHER): Payer: Medicare Other | Admitting: Physical Therapy

## 2020-05-16 ENCOUNTER — Other Ambulatory Visit: Payer: Self-pay

## 2020-05-16 DIAGNOSIS — G8929 Other chronic pain: Secondary | ICD-10-CM

## 2020-05-16 DIAGNOSIS — M545 Low back pain, unspecified: Secondary | ICD-10-CM

## 2020-05-16 NOTE — Patient Instructions (Signed)
Access Code: [email or web address] URL: [email or web address] Date: 05/16/2020 Prepared by: Lyndee Hensen  Exercises Standing Sidebending with Chair Support - 2 x daily - 3 reps - 30 hold Standing Sidebends - 2 x daily - 5-10 reps - 5 hold Gastroc Stretch on Wall - 2 x daily - 3 sets - 30 hold Seated Hamstring Stretch - 2 x daily - 3 sets - 60 hold Prone Quadriceps Stretch with Strap - 1 x daily - 3 reps - 30 hold Seated Knee Extension AROM - 1 x daily - 2 sets - 10 reps Supine Single Knee to Chest Stretch - 2 x daily - 60 hold - 3 sets Straight Leg Raise - 1 x daily - 1 sets - 10 reps Supine Bridge - 1 x daily - 2 sets - 10 reps Seated Knee Extension AROM - 1 x daily - 2 sets - 10 reps Standing Row with Anchored Resistance - 1 x daily - 2 sets - 10 reps

## 2020-05-17 ENCOUNTER — Encounter: Payer: Self-pay | Admitting: Family Medicine

## 2020-05-17 ENCOUNTER — Encounter: Payer: Self-pay | Admitting: Physical Therapy

## 2020-05-17 NOTE — Therapy (Signed)
[facility] [address] [place], [place], [address] Phone: [phone]   Fax:  [phone]  Physical Therapy Treatment/Discharge  Patient Details  Name: Kimberly [name] MRN: [id number] Date of Birth: 1950-10-02 Referring Provider (PT): [name]   Encounter Date: 05/16/2020   PT End of Session - 05/17/20 1027    Visit Number 5    Number of Visits 12    Date for PT Re-Evaluation 05/23/20    Authorization Type Medicare, no other visits in 2022    PT Start Time 1346    PT Stop Time 1428    PT Time Calculation (min) 42 min    Activity Tolerance Patient tolerated treatment well    Behavior During Therapy [facility] for tasks assessed/performed           Past Medical History:  Diagnosis Date  . Arthritis   . Cataract   . Diverticulosis   . Meniere's disease, bilateral   . Osteopenia     Past Surgical History:  Procedure Laterality Date  . ABDOMINAL HYSTERECTOMY    . CHOLECYSTECTOMY    . EYE SURGERY    . THYROIDECTOMY, PARTIAL      There were no vitals filed for this visit.   Subjective Assessment - 05/17/20 1025    Subjective Pt states no pain in her back. THinks she is doing well with HEP. Reports that dizziness is also much improved wtih medicine.    Currently in Pain? No/denies    Pain Score 0-No pain                             [facility] Adult PT Treatment/Exercise - 05/17/20 0001      Posture/Postural Control   Posture Comments Scoliosis,  L shoulder lower, L Le longer in supine      Exercises   Exercises Lumbar      Lumbar Exercises: Stretches   Active Hamstring Stretch 3 reps;30 seconds   seated   Single Knee to Chest Stretch 3 reps;30 seconds    Pelvic Tilt 20 reps    Quad Stretch 3 reps;30 seconds    Quad Stretch Limitations prone with strap    Other Lumbar Stretch Exercise Standing QL/SB stretch 30 sec x 3;       Lumbar Exercises: Standing   Row 20 reps    Theraband Level (Row) Level 3 (Green)     Other Standing Lumbar Exercises R SB x 10;      Lumbar Exercises: Seated   Sit to Stand 10 reps      Lumbar Exercises: Supine   Bent Knee Raise 20 reps    Bent Knee Raise Limitations GTB with TA    Bridge 20 reps      Manual Therapy   Joint Mobilization long leg distaction x 2 min bil; for lumbar pump                  PT Education - 05/17/20 1026    Education Details Final HEP reviewed    Person(s) Educated Patient    Methods Explanation;Demonstration;Tactile cues;Verbal cues;Handout    Comprehension Verbalized understanding;Returned demonstration;Verbal cues required;Tactile cues required;Need further instruction            PT Short Term Goals - 05/17/20 1030      PT SHORT TERM GOAL #1   Title Pt to be independent with initial HEP    Time 2    Period Weeks  Status Achieved    Target Date 04/25/20             PT Long Term Goals - 05/17/20 1031      PT LONG TERM GOAL #1   Title Pt to be independent with final HEP    Time 6    Period Weeks    Status Achieved      PT LONG TERM GOAL #2   Title Pt to report decreased pain in lumbar region to 0-2/10 with activity    Time 6    Period Weeks    Status Achieved      PT LONG TERM GOAL #3   Title Pt to demo improved lumbar ROM to be [employer] and pain free, to improve ability for ADLs.    Time 6    Period Weeks    Status Achieved      PT LONG TERM GOAL #4   Title Pt to demo improved strength of Bil hips and knees to at least 4+/5 to improve pain and stability    Time 6    Period Weeks    Status Achieved                 Plan - 05/17/20 1034    Clinical Impression Statement Pt doing very well at this time. She is having no pain in her back at this time. She has been doing more with HEP.Final HEP reviewed in detail.  Pt has met goals at this time and is ready for d/c to HEP. Pt in agreement with plan.    Personal Factors and Comorbidities Fitness;Time since onset of  injury/illness/exacerbation;Comorbidity 1    Comorbidities Scoliosis, B knee pain    Examination-Activity Limitations Locomotion Level;Bend;Lift;Stand;Stairs;Squat    Examination-Participation Restrictions Cleaning;Meal Prep;Yard Work;Community Activity;Laundry;Shop;Driving    Stability/Clinical Decision Making Stable/Uncomplicated    Rehab Potential Good    PT Frequency 2x / week    PT Duration 6 weeks    PT Treatment/Interventions ADLs/Self Care Home Management;Cryotherapy;Electrical Stimulation;Ultrasound;Traction;Moist Heat;Iontophoresis 43m/ml Dexamethasone;Gait training;Stair training;Functional mobility training;Therapeutic activities;Therapeutic exercise;Balance training;Orthotic Fit/Training;Patient/family education;Neuromuscular re-education;Manual techniques;Passive range of motion;Dry needling;Vasopneumatic Device;Joint Manipulations;Spinal Manipulations    Consulted and Agree with Plan of Care Patient           Patient will benefit from skilled therapeutic intervention in order to improve the following deficits and impairments:  Abnormal gait,Pain,Improper body mechanics,Postural dysfunction,Increased muscle spasms,Decreased mobility,Decreased activity tolerance,Decreased range of motion,Decreased strength,Impaired flexibility  Visit Diagnosis: Chronic right-sided low back pain without sciatica     Problem List Patient Active Problem List   Diagnosis Date Noted  . Degenerative arthritis of knee, bilateral 12/15/2019  . Degenerative joint disease of knee, right 11/29/2019  . Baker's cyst of knee, right 09/28/2019  . Hyperlipidemia, mixed 09/01/2019  . Nonallopathic lesion of lumbar region 08/10/2019  . Nonallopathic lesion of thoracic region 08/10/2019  . Nonallopathic lesion of sacral region 08/10/2019  . Partial tear of right hamstring 07/13/2019  . Meniere's disease of both ears 10/05/2018  . Osteopenia 10/05/2018  . Low back pain potentially associated with spinal  stenosis 10/05/2018    LLyndee Hensen PT, DPT 10:39 AM  05/17/20      [facility] [address]Phone: [phone]  Fax:  [phone] Name: Kimberly VanloanMRN: [id number] of Birth: 4Oct 10, 1952   PHYSICAL THERAPY DISCHARGE SUMMARY  Visits from Start of Care: 5  Plan: Patient agrees to discharge.  Patient goals were  met. Patient is being discharged due to meeting the stated rehab goals.  ?????     Lyndee Hensen, PT, DPT 10:39 AM  05/17/20

## 2020-06-14 ENCOUNTER — Ambulatory Visit: Payer: Medicare Other | Admitting: Family Medicine

## 2020-06-20 NOTE — Progress Notes (Signed)
[place] Phone: [phone] Subjective:   Kimberly Kimberly [name], am serving as a scribe for Dr. Hulan Saas. This visit occurred during the SARS-CoV-2 public health emergency.  Safety protocols were in place, including screening questions prior to the visit, additional usage of staff PPE, and extensive cleaning of exam room while observing appropriate contact time as indicated for disinfecting solutions.   I'm seeing this patient by the request  of:  Orma Flaming, MD  CC: Neck and back pain follow-up  [id number]  Kimberly Kimberly [name] is a 70 y.o. female coming in with complaint of back and neck pain. Also f/u for B knee pain. OMT 03/30/2020. Has been doing PT. Patient states that this gel injection did not last as long as the first round of gel.   Back pain is the same as last visit. Had to cancel last adjustment and would like to get this today.   Medications patient has been prescribed: None          Reviewed prior external information including notes and imaging from previsou exam, outside providers and external EMR if available.   As well as notes that were available from care everywhere and other healthcare systems.  Past medical history, social, surgical and family history all reviewed in electronic medical record.  Kimberly [name] unless stated regarding to the chief complaint.   Past Medical History:  Diagnosis Date  . Arthritis   . Cataract   . Diverticulosis   . Meniere's disease, bilateral   . Osteopenia     Allergies  Allergen Reactions  . Erythromycin Diarrhea  . Clindamycin/Lincomycin Nausea And Vomiting  . Morphine And Related Itching  . Latex Rash     Review of Systems:  Kimberly [name] headache, visual changes, nausea, vomiting, diarrhea, constipation, dizziness, abdominal pain, skin rash, fevers, chills, night sweats, weight loss, swollen lymph nodes, body aches, joint swelling, chest pain,  shortness of breath, mood changes. POSITIVE muscle aches  Objective  Blood pressure 110/80, pulse 92, height 5\' 8"  (1.727 m), weight 191 lb (86.6 kg), SpO2 97 %.   General: Kimberly [name] apparent distress alert and oriented x3 mood and affect normal, dressed appropriately.  HEENT: Pupils equal, extraocular movements intact  Respiratory: Patient's speak in full sentences and does not appear short of breath  Cardiovascular: Kimberly [name] lower extremity edema, non tender, Kimberly [name] erythema  Low back exam does have significant tightness noted.  Patient does have some mild degenerative scoliosis.  Tightness more in the thoracolumbar juncture.  Negative straight leg test.  Knee exam to have some mild arthritic changes noted.  Crepitus noted with patellofemoral.  Patient does have a mild popliteal fullness noted of the right knee.  Osteopathic findings  T9 extended rotated and side bent left L2 flexed rotated and side bent right Sacrum right on right       Assessment and Plan:  Degenerative arthritis of knee, bilateral Patient given gel injections again today.  We will see how patient tolerates it.  We will monitor continue to do this more than 6-week intervals if possible.  We will continue to monitor otherwise.  Follow-up with me again in 2 to 3 months.  Baker's cyst of knee, right We will continue to monitor.  He may need aspiration in the near future.  Discussed icing regimen and home exercises.  Patient will continue with compression as necessary.  Follow-up again in 3 months  Low back pain potentially associated with spinal  stenosis Low back pain.  Discussed which activities to do which wants to avoid.  Increase activity slowly.  Discussed icing regimen and home exercises.  Follow-up again in 6 to 8 weeks.    Nonallopathic problems  Decision today to treat with OMT was based on Physical Exam  After verbal consent patient was treated with HVLA, ME, FPR techniques in  thoracic, lumbar, and sacral   areas  Patient tolerated the procedure well with improvement in symptoms  Patient given exercises, stretches and lifestyle modifications  See medications in patient instructions if given  Patient will follow up in 4-8 weeks      The above documentation has been reviewed and is accurate and complete Kimberly Pulley, DO       Note: This dictation was prepared with Dragon dictation along with smaller phrase technology. Any transcriptional errors that result from this process are unintentional.

## 2020-06-21 ENCOUNTER — Other Ambulatory Visit: Payer: Self-pay

## 2020-06-21 ENCOUNTER — Ambulatory Visit (INDEPENDENT_AMBULATORY_CARE_PROVIDER_SITE_OTHER): Payer: Medicare Other | Admitting: Family Medicine

## 2020-06-21 ENCOUNTER — Encounter: Payer: Self-pay | Admitting: Family Medicine

## 2020-06-21 VITALS — BP 110/80 | HR 92 | Ht 68.0 in | Wt 191.0 lb

## 2020-06-21 DIAGNOSIS — M170 Bilateral primary osteoarthritis of knee: Secondary | ICD-10-CM

## 2020-06-21 DIAGNOSIS — M9903 Segmental and somatic dysfunction of lumbar region: Secondary | ICD-10-CM | POA: Diagnosis not present

## 2020-06-21 DIAGNOSIS — M7121 Synovial cyst of popliteal space [Baker], right knee: Secondary | ICD-10-CM

## 2020-06-21 DIAGNOSIS — M9902 Segmental and somatic dysfunction of thoracic region: Secondary | ICD-10-CM | POA: Diagnosis not present

## 2020-06-21 DIAGNOSIS — M9904 Segmental and somatic dysfunction of sacral region: Secondary | ICD-10-CM | POA: Diagnosis not present

## 2020-06-21 DIAGNOSIS — M545 Low back pain, unspecified: Secondary | ICD-10-CM | POA: Diagnosis not present

## 2020-06-21 NOTE — Assessment & Plan Note (Signed)
Patient given gel injections again today.  We will see how patient tolerates it.  We will monitor continue to do this more than 6-week intervals if possible.  We will continue to monitor otherwise.  Follow-up with me again in 2 to 3 months.

## 2020-06-21 NOTE — Patient Instructions (Addendum)
Gel injections Tried manipulation Stay active and out of trouble2 months to see if you need to drain the baker cyst

## 2020-06-21 NOTE — Assessment & Plan Note (Signed)
We will continue to monitor.  He may need aspiration in the near future.  Discussed icing regimen and home exercises.  Patient will continue with compression as necessary.  Follow-up again in 3 months

## 2020-06-21 NOTE — Assessment & Plan Note (Signed)
Low back pain.  Discussed which activities to do which wants to avoid.  Increase activity slowly.  Discussed icing regimen and home exercises.  Follow-up again in 6 to 8 weeks.

## 2020-08-21 NOTE — Progress Notes (Signed)
[place] Phone: [phone] Subjective:   Kimberly [name], am serving as a scribe for Dr. Hulan Saas. This visit occurred during the SARS-CoV-2 public health emergency.  Safety protocols were in place, including screening questions prior to the visit, additional usage of staff PPE, and extensive cleaning of exam room while observing appropriate contact time as indicated for disinfecting solutions.   I'm seeing this patient by the request  of:  Orma Flaming, MD  CC: Knee pain and back pain follow-up  [id number]  06/21/2020 Low back pain.  Discussed which activities to do which wants to avoid.  Increase activity slowly.  Discussed icing regimen and home exercises.  Follow-up again in 6 to 8 weeks.  We will continue to monitor.  He may need aspiration in the near future.  Discussed icing regimen and home exercises.  Patient will continue with compression as necessary.  Follow-up again in 3 months  Patient given gel injections again today.  We will see how patient tolerates it.  We will monitor continue to do this more than 6-week intervals if possible.  We will continue to monitor otherwise.  Follow-up with me again in 2 to 3 months.  Update 08/25/2020 Kimberly [name] is a 70 y.o. female coming in with complaint of B knee pain and back pain. Patient states that her back pain remains unchanged.   Gel injection did not alleviate her pain in the knees. Patient is using Voltaren gel on knees for relief.  Patient is still able to do most daily activities and is walking a considerable amount.  Patient does state maybe it has made some improvement because she has not thought about her knees quite as frequently.      Past Medical History:  Diagnosis Date   Arthritis    Cataract    Diverticulosis    Meniere's disease, bilateral    Osteopenia    Past Surgical History:  Procedure Laterality Date   ABDOMINAL  HYSTERECTOMY     CHOLECYSTECTOMY     EYE SURGERY     THYROIDECTOMY, PARTIAL     Social History   Socioeconomic History   Marital status: Single    Spouse name: Not on file   Number of children: Not on file   Years of education: Not on file   Highest education level: Not on file  Occupational History   Occupation: Marine scientist  Tobacco Use   Smoking status: Every Day    Packs/day: 1.00    Types: Cigarettes   Smokeless tobacco: Never  Vaping Use   Vaping Use: Never used  Substance and Sexual Activity   Alcohol use: Yes    Alcohol/week: 21.0 standard drinks    Types: 21 Glasses of wine per week    Comment: 3 glasses of wine per day   Drug use: Never   Sexual activity: Not Currently  Other Topics Concern   Not on file  Social History Narrative   Not on file   Social Determinants of Health   Financial Resource Strain: Not on file  Food Insecurity: Not on file  Transportation Needs: Not on file  Physical Activity: Not on file  Stress: Not on file  Social Connections: Not on file   Allergies  Allergen Reactions   Erythromycin Diarrhea   Clindamycin/Lincomycin Nausea And Vomiting   Morphine And Related Itching   Latex Rash   Family History  Problem Relation Age of Onset  Colon polyps Mother    Heart disease Mother    Heart disease Father    Stomach cancer Paternal Grandfather      Current Outpatient Medications (Cardiovascular):    rosuvastatin (CRESTOR) 5 MG tablet, Take 1 tablet (5 mg total) by mouth daily.  Current Outpatient Medications (Respiratory):    cetirizine (ZYRTEC) 10 MG tablet, Take 10 mg by mouth daily.    Current Outpatient Medications (Other):    amoxicillin-clavulanate (AUGMENTIN) 875-125 MG tablet, Take 1 tablet by mouth 2 (two) times daily.   Calcium Carbonate-Vit D-Min (CALCIUM 1200 PO), Take by mouth daily.   gabapentin (NEURONTIN) 100 MG capsule, Take 2 capsules (200 mg total) by mouth at bedtime.   gentamicin cream (GARAMYCIN)  0.1 %, Apply 1 application topically 2 (two) times daily.   meclizine (ANTIVERT) 25 MG tablet, Take 1 tablet (25 mg total) by mouth 3 (three) times daily as needed for dizziness.   Multiple Vitamins-Minerals (HAIR SKIN AND NAILS FORMULA PO), Take by mouth.   Multiple Vitamins-Minerals (WOMENS MULTIVITAMIN PO), Take by mouth.   vitamin E 200 UNIT capsule, Take 200 Units by mouth daily.   Reviewed prior external information including notes and imaging from  primary care provider As well as notes that were available from care everywhere and other healthcare systems.  Past medical history, social, surgical and family history all reviewed in electronic medical record.  [name] unless stated regarding to the chief complaint.   Review of Systems:  [name] headache, visual changes, nausea, vomiting, diarrhea, constipation, dizziness, abdominal pain, skin rash, fevers, chills, night sweats, weight loss, swollen lymph nodes, joint swelling, chest pain, shortness of breath, mood changes. POSITIVE muscle aches, body aches  Objective  Blood pressure 118/80, pulse 88, height 5\' 8"  (1.727 m), weight 192 lb (87.1 kg), SpO2 97 %.   General: [name] apparent distress alert and oriented x3 mood and affect normal, dressed appropriately. Respiratory: Patient's speak in full sentences and does not appear short of breath  Cardiovascular: [name] lower extremity edema, non tender, [name] erythema  Gait normal with good balance and coordination.  MSK: Low back exam does have some mild loss of lordosis and some degenerative scoliosis.  Mild tightness with FABER test bilaterally. Patient does have mild tenderness to palpation in the paraspinal musculature of the lumbar spine as well as at the thoracolumbar juncture in the sacroiliac joints bilaterally.  Bilateral knee exams do show significant arthritic changes of the patellofemoral joint bilaterally with lateral tracking.  Mild instability noted with valgus and varus  force low.  Osteopathic findings T7 extended rotated and side bent right T11 extended rotated and side bent left L3 flexed rotated and side bent right Sacrum left on left   Impression and Recommendations:    The above documentation has been reviewed and is accurate and complete Lyndal Pulley, DO

## 2020-08-25 ENCOUNTER — Encounter: Payer: Self-pay | Admitting: Family Medicine

## 2020-08-25 ENCOUNTER — Other Ambulatory Visit: Payer: Self-pay

## 2020-08-25 ENCOUNTER — Ambulatory Visit (INDEPENDENT_AMBULATORY_CARE_PROVIDER_SITE_OTHER): Payer: Medicare Other | Admitting: Family Medicine

## 2020-08-25 VITALS — BP 118/80 | HR 88 | Ht 68.0 in | Wt 192.0 lb

## 2020-08-25 DIAGNOSIS — M170 Bilateral primary osteoarthritis of knee: Secondary | ICD-10-CM

## 2020-08-25 DIAGNOSIS — M999 Biomechanical lesion, unspecified: Secondary | ICD-10-CM | POA: Diagnosis not present

## 2020-08-25 DIAGNOSIS — M9904 Segmental and somatic dysfunction of sacral region: Secondary | ICD-10-CM | POA: Diagnosis not present

## 2020-08-25 DIAGNOSIS — M545 Low back pain, unspecified: Secondary | ICD-10-CM

## 2020-08-25 DIAGNOSIS — M9903 Segmental and somatic dysfunction of lumbar region: Secondary | ICD-10-CM | POA: Diagnosis not present

## 2020-08-25 DIAGNOSIS — M9902 Segmental and somatic dysfunction of thoracic region: Secondary | ICD-10-CM

## 2020-08-25 NOTE — Patient Instructions (Signed)
Good to see you Enjoy [place]  We will watch the knees  See me again in 6-8 weeks

## 2020-08-25 NOTE — Assessment & Plan Note (Signed)
Known degenerative disc disease.  Responding well to the gabapentin and osteopathic epilation.  Responded well to today.  Patient is encouraged to continue to stay active and do the home exercises.  Follow-up with me again 6 to 8 weeks

## 2020-08-25 NOTE — Assessment & Plan Note (Signed)

## 2020-08-25 NOTE — Assessment & Plan Note (Signed)
Patient overall seems to be doing relatively well.  We will continue to monitor.  Patient could be a candidate for another steroid injection and follow-up in 6 to 8 weeks if needed.  Patient wants to avoid any surgical intervention which I think patient can probably do for hopefully a while longer.

## 2020-09-01 ENCOUNTER — Ambulatory Visit: Payer: Federal, State, Local not specified - PPO | Admitting: Family Medicine

## 2020-09-26 ENCOUNTER — Other Ambulatory Visit: Payer: Self-pay

## 2020-09-26 ENCOUNTER — Encounter: Payer: Self-pay | Admitting: Physician Assistant

## 2020-09-26 ENCOUNTER — Ambulatory Visit (INDEPENDENT_AMBULATORY_CARE_PROVIDER_SITE_OTHER): Payer: Medicare Other | Admitting: Physician Assistant

## 2020-09-26 VITALS — BP 108/69 | HR 71 | Temp 97.3°F | Ht 68.0 in | Wt 192.0 lb

## 2020-09-26 DIAGNOSIS — R42 Dizziness and giddiness: Secondary | ICD-10-CM | POA: Diagnosis not present

## 2020-09-26 DIAGNOSIS — Z131 Encounter for screening for diabetes mellitus: Secondary | ICD-10-CM

## 2020-09-26 DIAGNOSIS — D72821 Monocytosis (symptomatic): Secondary | ICD-10-CM | POA: Diagnosis not present

## 2020-09-26 DIAGNOSIS — M545 Low back pain, unspecified: Secondary | ICD-10-CM

## 2020-09-26 DIAGNOSIS — E782 Mixed hyperlipidemia: Secondary | ICD-10-CM

## 2020-09-26 DIAGNOSIS — G8929 Other chronic pain: Secondary | ICD-10-CM

## 2020-09-26 LAB — COMPREHENSIVE METABOLIC PANEL
ALT: 17 U/L (ref 0–35)
AST: 17 U/L (ref 0–37)
Albumin: 4 g/dL (ref 3.5–5.2)
Alkaline Phosphatase: 90 U/L (ref 39–117)
BUN: 20 mg/dL (ref 6–23)
CO2: 28 mEq/L (ref 19–32)
Calcium: 9.2 mg/dL (ref 8.4–10.5)
Chloride: 106 mEq/L (ref 96–112)
Creatinine, Ser: 0.96 mg/dL (ref 0.40–1.20)
GFR: 59.6 mL/min — ABNORMAL LOW (ref >60.00)
Glucose, Bld: 106 mg/dL — ABNORMAL HIGH (ref 70–99)
Potassium: 4.6 mEq/L (ref 3.5–5.1)
Sodium: 140 mEq/L (ref 135–145)
Total Bilirubin: 0.5 mg/dL (ref 0.2–1.2)
Total Protein: 6.8 g/dL (ref 6.0–8.3)

## 2020-09-26 LAB — CBC WITH DIFFERENTIAL/PLATELET
Basophils Absolute: 0.1 10*3/uL (ref 0.0–0.1)
Basophils Relative: 1.1 % (ref 0.0–3.0)
Eosinophils Absolute: 0.1 10*3/uL (ref 0.0–0.7)
Eosinophils Relative: 1.2 % (ref 0.0–5.0)
HCT: 41.2 % (ref 36.0–46.0)
Hemoglobin: 13.3 g/dL (ref 12.0–15.0)
Lymphocytes Relative: 35.1 % (ref 12.0–46.0)
Lymphs Abs: 1.9 10*3/uL (ref 0.7–4.0)
MCHC: 32.2 g/dL (ref 30.0–36.0)
MCV: 79.2 fl (ref 78.0–100.0)
Monocytes Absolute: 0.8 10*3/uL (ref 0.1–1.0)
Monocytes Relative: 14.2 % — ABNORMAL HIGH (ref 3.0–12.0)
Neutro Abs: 2.6 10*3/uL (ref 1.4–7.7)
Neutrophils Relative %: 48.4 % (ref 43.0–77.0)
Platelets: 208 10*3/uL (ref 150.0–400.0)
RBC: 5.2 Mil/uL — ABNORMAL HIGH (ref 3.87–5.11)
RDW: 14.6 % (ref 11.5–15.5)
WBC: 5.4 10*3/uL (ref 4.0–10.5)

## 2020-09-26 LAB — LIPID PANEL
Cholesterol: 171 mg/dL (ref 0–200)
HDL: 59.8 mg/dL (ref >39.00)
LDL Cholesterol: 97 mg/dL (ref 0–99)
NonHDL: 111.36
Total CHOL/HDL Ratio: 3
Triglycerides: 73 mg/dL (ref 0.0–149.0)
VLDL: 14.6 mg/dL (ref 0.0–40.0)

## 2020-09-26 NOTE — Progress Notes (Signed)
Established Patient Office Visit  Subjective:  Patient ID: Kimberly [name], female    DOB: 1950/04/20  Age: 70 y.o. MRN: [id number]  CC:  Chief Complaint  Patient presents with   Hyperlipidemia    HPI Kimberly [name] presents for follow up and transition of care from Kimberly [name].   Dermatologist appointment coming up for annual skin exam. She does not have a history of skin cancer.  Kimberly [name], sports medicine, for lumbar stenosis which she has gabapentin for, but says she does not take this and just tolerates her mild pain. Also has hx of baker's cysts and arthritis.   Ophthalmologist and dental exams coming up soon as well per patient.   Past Medical History:  Diagnosis Date   Arthritis    Cataract    Diverticulosis    Meniere's disease, bilateral    Osteopenia     Past Surgical History:  Procedure Laterality Date   ABDOMINAL HYSTERECTOMY     CHOLECYSTECTOMY     EYE SURGERY     THYROIDECTOMY, PARTIAL      Family History  Problem Relation Age of Onset   Colon polyps Mother    Heart disease Mother    Heart disease Father    Stomach cancer Paternal Grandfather     Social History   Socioeconomic History   Marital status: Single    Spouse name: Not on file   Number of children: Not on file   Years of education: Not on file   Highest education level: Not on file  Occupational History   Occupation: Marine scientist  Tobacco Use   Smoking status: Every Day    Packs/day: 1.00    Types: Cigarettes   Smokeless tobacco: Never  Vaping Use   Vaping Use: Never used  Substance and Sexual Activity   Alcohol use: Yes    Alcohol/week: 21.0 standard drinks    Types: 21 Glasses of wine per week    Comment: 3 glasses of wine per day   Drug use: Never   Sexual activity: Not Currently  Other Topics Concern   Not on file  Social History Narrative   Not on file   Social Determinants of Health   Financial Resource Strain: Not on file  Food Insecurity: Not on file   Transportation Needs: Not on file  Physical Activity: Not on file  Stress: Not on file  Social Connections: Not on file  Intimate Partner Violence: Not on file    Outpatient Medications Prior to Visit  Medication Sig Dispense Refill   Calcium Carbonate-Vit D-Min (CALCIUM 1200 PO) Take by mouth daily.     cetirizine (ZYRTEC) 10 MG tablet Take 10 mg by mouth daily.     gabapentin (NEURONTIN) 100 MG capsule Take 2 capsules (200 mg total) by mouth at bedtime. 180 capsule 3   gentamicin cream (GARAMYCIN) 0.1 % Apply 1 application topically 2 (two) times daily. 30 g 1   meclizine (ANTIVERT) 25 MG tablet Take 1 tablet (25 mg total) by mouth 3 (three) times daily as needed for dizziness. 60 tablet 0   Multiple Vitamins-Minerals (HAIR SKIN AND NAILS FORMULA PO) Take by mouth.     Multiple Vitamins-Minerals (WOMENS MULTIVITAMIN PO) Take by mouth.     rosuvastatin (CRESTOR) 5 MG tablet Take 1 tablet (5 mg total) by mouth daily. 90 tablet 3   vitamin E 200 UNIT capsule Take 200 Units by mouth daily.     amoxicillin-clavulanate (AUGMENTIN) 875-125 MG tablet Take 1 tablet by  mouth 2 (two) times daily. 20 tablet 0   No facility-administered medications prior to visit.    Allergies  Allergen Reactions   Erythromycin Diarrhea   Clindamycin/Lincomycin Nausea And Vomiting   Morphine And Related Itching   Latex Rash    ROS Review of Systems  Constitutional:  Negative for chills, fatigue and fever.  HENT:  Negative for dental problem, ear pain, hearing loss and trouble swallowing.   Eyes:  Negative for visual disturbance.  Respiratory:  Negative for cough, chest tightness and shortness of breath.   Cardiovascular:  Negative for chest pain, palpitations and leg swelling.  Gastrointestinal:  Negative for abdominal pain, blood in stool, diarrhea and nausea.  Endocrine: Negative for cold intolerance, polydipsia, polyphagia and polyuria.  Genitourinary:  Negative for dysuria, hematuria, pelvic pain  and urgency.  Musculoskeletal:  Positive for arthralgias and back pain.  Skin:  Negative for rash.  Neurological:  Negative for dizziness, light-headedness and headaches.  Psychiatric/Behavioral:  Negative for dysphoric mood and sleep disturbance. The patient is not nervous/anxious.      Objective:    Physical Exam Vitals and nursing note reviewed.  Constitutional:      General: She is not in acute distress.    Appearance: Normal appearance. She is normal weight.  HENT:     Head: Normocephalic.     Right Ear: External ear normal.     Left Ear: External ear normal.     Nose: Nose normal.     Mouth/Throat:     Mouth: Mucous membranes are moist.  Eyes:     Extraocular Movements: Extraocular movements intact.     Conjunctiva/sclera: Conjunctivae normal.     Pupils: Pupils are equal, round, and reactive to light.  Cardiovascular:     Rate and Rhythm: Normal rate and regular rhythm.     Pulses: Normal pulses.     Heart sounds: No murmur heard. Pulmonary:     Effort: Pulmonary effort is normal.     Breath sounds: Normal breath sounds.  Abdominal:     General: Abdomen is flat. Bowel sounds are normal.     Palpations: Abdomen is soft.     Tenderness: There is no abdominal tenderness.  Musculoskeletal:        General: Normal range of motion.     Cervical back: Normal range of motion.  Skin:    General: Skin is warm.  Neurological:     General: No focal deficit present.     Mental Status: She is alert and oriented to person, place, and time.     Gait: Gait normal.  Psychiatric:        Mood and Affect: Mood normal.        Behavior: Behavior normal.    BP 108/69   Pulse 71   Temp (!) 97.3 F (36.3 C)   Ht '5\' 8"'$  (1.727 m)   Wt 192 lb (87.1 kg)   SpO2 97%   BMI 29.19 kg/m  Wt Readings from Last 3 Encounters:  09/26/20 192 lb (87.1 kg)  08/25/20 192 lb (87.1 kg)  06/21/20 191 lb (86.6 kg)     Health Maintenance Due  Topic Date Due   INFLUENZA VACCINE  09/11/2020     There are no preventive care reminders to display for this patient.  Lab Results  Component Value Date   TSH 0.90 09/01/2019   Lab Results  Component Value Date   WBC 7.9 12/02/2019   HGB 12.9 12/02/2019   HCT 40.1  12/02/2019   MCV 80.4 12/02/2019   PLT 255 12/02/2019   Lab Results  Component Value Date   NA 140 09/01/2019   K 4.5 09/01/2019   CO2 27 09/01/2019   GLUCOSE 120 (H) 09/01/2019   BUN 17 09/01/2019   CREATININE 1.00 (H) 09/01/2019   BILITOT 0.9 09/01/2019   AST 15 09/01/2019   ALT 17 09/01/2019   PROT 6.7 09/01/2019   CALCIUM 9.3 09/01/2019   Lab Results  Component Value Date   CHOL 199 09/01/2019   Lab Results  Component Value Date   HDL 62 09/01/2019   Lab Results  Component Value Date   LDLCALC 118 (H) 09/01/2019   Lab Results  Component Value Date   TRIG 89 09/01/2019   Lab Results  Component Value Date   CHOLHDL 3.2 09/01/2019   Lab Results  Component Value Date   HGBA1C 5.1 09/01/2019      Assessment & Plan:   Problem List Items Addressed This Visit   None Visit Diagnoses     Mixed hyperlipidemia    -  Primary   Relevant Orders   Lipid panel   Monocytosis       Relevant Orders   CBC with Differential/Platelet   Chronic right-sided low back pain without sciatica       Dizziness       Diabetes mellitus screening       Relevant Orders   Comprehensive metabolic panel       No orders of the defined types were placed in this encounter.   Follow-up: Return in about 1 year (around 09/26/2021) for fasting labs and follow up .   1. Mixed hyperlipidemia She is taking Crestor 5 mg daily. Will recheck this lab value today. Encouraged healthy lifestyle.  2. Monocytosis Last CBC checked in December 21 and stable. Kimberly [name] attributed to her alcohol intake - patient admits she indulges in wine. Will recheck CBC today and if trending upward though, she will need to see hematology.  3. Chronic right-sided low back pain without  sciatica Doing well and managing pain on her own. She sees Kimberly [name]. She has gabapentin to take as needed.  4. Dizziness Resolved. Meclizine helped tremendously per patient.  5. Diabetes mellitus screening CMP lab today.     Jaquita Bessire M Ceferino Lang, PA-C

## 2020-09-26 NOTE — Patient Instructions (Signed)
Good to meet you today! Please go to the lab for blood work and I will send results through [facility]. Call if any concerns

## 2020-09-28 ENCOUNTER — Telehealth: Payer: Self-pay

## 2020-09-28 NOTE — Telephone Encounter (Signed)
See result notes. 

## 2020-09-28 NOTE — Telephone Encounter (Signed)
Pt called back to speak to [facility].

## 2020-10-04 ENCOUNTER — Telehealth: Payer: Self-pay

## 2020-10-04 ENCOUNTER — Encounter: Payer: Self-pay | Admitting: Physician Assistant

## 2020-10-04 NOTE — Telephone Encounter (Signed)
Patient is requesting TOC to Dr. [name]. She is an identical twin to his current patient, Kimberly [name] and prefers to have to same care because they live together and eat the same diet.

## 2020-10-04 NOTE — Telephone Encounter (Signed)
[place] with me.   [name]. [name], MD 10/04/2020 12:01 PM

## 2020-10-06 NOTE — Telephone Encounter (Signed)
Patient scheduled.
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
## 2020-10-13 ENCOUNTER — Ambulatory Visit: Payer: Self-pay

## 2020-10-13 ENCOUNTER — Encounter: Payer: Self-pay | Admitting: Family Medicine

## 2020-10-13 ENCOUNTER — Other Ambulatory Visit: Payer: Self-pay

## 2020-10-13 ENCOUNTER — Ambulatory Visit (INDEPENDENT_AMBULATORY_CARE_PROVIDER_SITE_OTHER): Payer: Medicare Other | Admitting: Family Medicine

## 2020-10-13 VITALS — BP 116/80 | HR 77 | Ht 68.0 in | Wt 194.0 lb

## 2020-10-13 DIAGNOSIS — M9902 Segmental and somatic dysfunction of thoracic region: Secondary | ICD-10-CM

## 2020-10-13 DIAGNOSIS — M170 Bilateral primary osteoarthritis of knee: Secondary | ICD-10-CM | POA: Diagnosis not present

## 2020-10-13 DIAGNOSIS — G8929 Other chronic pain: Secondary | ICD-10-CM

## 2020-10-13 DIAGNOSIS — M9904 Segmental and somatic dysfunction of sacral region: Secondary | ICD-10-CM | POA: Diagnosis not present

## 2020-10-13 DIAGNOSIS — M25562 Pain in left knee: Secondary | ICD-10-CM | POA: Diagnosis not present

## 2020-10-13 DIAGNOSIS — M545 Low back pain, unspecified: Secondary | ICD-10-CM

## 2020-10-13 DIAGNOSIS — M25561 Pain in right knee: Secondary | ICD-10-CM

## 2020-10-13 DIAGNOSIS — M9903 Segmental and somatic dysfunction of lumbar region: Secondary | ICD-10-CM

## 2020-10-13 NOTE — Patient Instructions (Addendum)
Injected both knees today  Will get gel approved See as on Nov 11th or after for gel injections [facility] Let me know if you need to get in for OMT prior to injections

## 2020-10-13 NOTE — Assessment & Plan Note (Signed)
Bilateral steroid injections given in October 13, 2020.  Responded well to the injections.  Discussed icing regimen.  Patient will continue to be active otherwise.  Patient is a candidate for viscosupplementation and would consider repeating that in the next 2 months.  Follow-up with me again 2 months
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
## 2020-11-09 ENCOUNTER — Other Ambulatory Visit: Payer: Self-pay | Admitting: Family Medicine

## 2020-11-09 DIAGNOSIS — Z1231 Encounter for screening mammogram for malignant neoplasm of breast: Secondary | ICD-10-CM

## 2020-11-22 ENCOUNTER — Telehealth: Payer: Self-pay

## 2020-11-22 NOTE — Telephone Encounter (Signed)
Patient received her flu shot and covid booster on 11/20/20 at [facility]

## 2020-11-23 NOTE — Telephone Encounter (Signed)
Patient Chart has been updated

## 2020-12-11 ENCOUNTER — Ambulatory Visit (INDEPENDENT_AMBULATORY_CARE_PROVIDER_SITE_OTHER): Payer: Medicare Other | Admitting: Family Medicine

## 2020-12-11 ENCOUNTER — Telehealth: Payer: Self-pay

## 2020-12-11 ENCOUNTER — Other Ambulatory Visit: Payer: Self-pay

## 2020-12-11 ENCOUNTER — Encounter: Payer: Self-pay | Admitting: Family Medicine

## 2020-12-11 VITALS — BP 124/77 | HR 73 | Temp 98.1°F | Ht 68.0 in | Wt 193.4 lb

## 2020-12-11 DIAGNOSIS — F17210 Nicotine dependence, cigarettes, uncomplicated: Secondary | ICD-10-CM | POA: Diagnosis not present

## 2020-12-11 DIAGNOSIS — E782 Mixed hyperlipidemia: Secondary | ICD-10-CM | POA: Diagnosis not present

## 2020-12-11 DIAGNOSIS — F17200 Nicotine dependence, unspecified, uncomplicated: Secondary | ICD-10-CM | POA: Diagnosis not present

## 2020-12-11 DIAGNOSIS — M8580 Other specified disorders of bone density and structure, unspecified site: Secondary | ICD-10-CM | POA: Diagnosis not present

## 2020-12-11 DIAGNOSIS — H8103 Meniere's disease, bilateral: Secondary | ICD-10-CM

## 2020-12-11 DIAGNOSIS — L299 Pruritus, unspecified: Secondary | ICD-10-CM | POA: Diagnosis not present

## 2020-12-11 DIAGNOSIS — E559 Vitamin D deficiency, unspecified: Secondary | ICD-10-CM | POA: Diagnosis not present

## 2020-12-11 LAB — VITAMIN D 25 HYDROXY (VIT D DEFICIENCY, FRACTURES): VITD: 44.52 ng/mL (ref 30.00–100.00)

## 2020-12-11 MED ORDER — TRIAMCINOLONE ACETONIDE 0.1 % EX CREA: 1.0000 "application " | TOPICAL_CREAM | Freq: Two times a day (BID) | CUTANEOUS | 0 refills | Status: DC

## 2020-12-11 MED ORDER — ROSUVASTATIN CALCIUM 5 MG PO TABS: 5.0000 mg | ORAL_TABLET | Freq: Every day | ORAL | 3 refills | Status: DC

## 2020-12-11 MED ORDER — NYSTATIN 100000 UNIT/GM EX CREA: 1.0000 "application " | TOPICAL_CREAM | Freq: Two times a day (BID) | CUTANEOUS | 0 refills | Status: DC

## 2020-12-11 NOTE — Progress Notes (Signed)
Kimberly [name] is a 70 y.o. female who presents today for an office visit.  Assessment/Plan:  New/Acute Problems: Pruritus Likely candidal intertrigo.  Start topical nystatin triamcinolone.  She will let me know if not improving.  Chronic Problems Addressed Today: Nicotine dependence with current use Patient was asked about her tobacco use today and was strongly advised to quit. Patient is currently non contemplative. We reviewed treatment options to assist her quit smoking including NRT, Chantix, and Bupropion. Follow up at next office visit.   Total time spent counseling approximately 3 minutes.    Hyperlipidemia, mixed Last LDL 97.  Continue Crestor 5 mg daily  Osteopenia Check Vit D.   Meniere's disease of both ears Continue meclizine as needed.  She is working on low-sodium diet.     Subjective:  HPI:  Patient here to transfer care to [place] as her PCP.   She was diagnosed with Meniere's disease, but said she has not had an episode due to medication since 1993. She states that she does occasionally have dizziness however, but has not had issues with it since the Antivert prescription in March. No hearing problems or changes in hearing.  Were noted.  She states that she does smoke; quantity not given and does not plan on quitting. Also, she states that she eats no more than 400mg  sodium in a meal.  She is unsure if she has cataracts but is going to visit the ophthalmologist later this week. Her diverticulosis has been notable, with no solid bowel movements. Her last colonoscopy resulted in removal of polyps, with one being pre-cancerous.  Her gastroenterologist had suggested Miralax for her constipation, but that would not be relevant due to her having mostly an inability to have solid bowel movements.  She thinks she may have a yeast infection noted under breasts and in navel, denying it being a rash and instead referring to the spots as "itchy areas". Erythema is noted under  the breasts. However, she states that she has not had any issues with perspiration in the areas. Exercise not discussed.  PMH:  The following were reviewed and entered/updated in epic: Past Medical History:  Diagnosis Date   Arthritis    Cataract    Diverticulosis    Meniere's disease, bilateral    Osteopenia    Patient Active Problem List   Diagnosis Date Noted   Nicotine dependence with current use 12/11/2020   Degenerative arthritis of knee, bilateral 12/15/2019   Degenerative joint disease of knee, right 11/29/2019   Baker's cyst of knee, right 09/28/2019   Hyperlipidemia, mixed 09/01/2019   Nonallopathic lesion of lumbar region 08/10/2019   Nonallopathic lesion of thoracic region 08/10/2019   Nonallopathic lesion of sacral region 08/10/2019   Partial tear of right hamstring 07/13/2019   Meniere's disease of both ears 10/05/2018   Osteopenia 10/05/2018   Low back pain potentially associated with spinal stenosis 10/05/2018   Past Surgical History:  Procedure Laterality Date   ABDOMINAL HYSTERECTOMY     CHOLECYSTECTOMY     EYE SURGERY     age 79   THYROIDECTOMY, PARTIAL      Family History  Problem Relation Age of Onset   Colon polyps Mother    Heart disease Mother    Heart disease Father    Stomach cancer Paternal Grandfather     Medications- reviewed and updated Current Outpatient Medications  Medication Sig Dispense Refill   Calcium Carbonate-Vit D-Min (CALCIUM 1200 PO) Take by mouth daily.  cetirizine (ZYRTEC) 10 MG tablet Take 10 mg by mouth daily.     gabapentin (NEURONTIN) 100 MG capsule Take 2 capsules (200 mg total) by mouth at bedtime. 180 capsule 3   gentamicin cream (GARAMYCIN) 0.1 % Apply 1 application topically 2 (two) times daily. 30 g 1   meclizine (ANTIVERT) 25 MG tablet Take 1 tablet (25 mg total) by mouth 3 (three) times daily as needed for dizziness. 60 tablet 0   Multiple Vitamins-Minerals (HAIR SKIN AND NAILS FORMULA PO) Take by mouth.      Multiple Vitamins-Minerals (WOMENS MULTIVITAMIN PO) Take by mouth.     nystatin cream (MYCOSTATIN) Apply 1 application topically 2 (two) times daily. 30 g 0   triamcinolone cream (KENALOG) 0.1 % Apply 1 application topically 2 (two) times daily. 30 g 0   vitamin E 200 UNIT capsule Take 200 Units by mouth daily.     rosuvastatin (CRESTOR) 5 MG tablet Take 1 tablet (5 mg total) by mouth daily. 90 tablet 3   No current facility-administered medications for this visit.    Allergies-reviewed and updated Allergies  Allergen Reactions   Erythromycin Diarrhea   Clindamycin/Lincomycin Nausea And Vomiting   Morphine And Related Itching   Latex Rash    Social History   Socioeconomic History   Marital status: Single    Spouse name: Not on file   Number of children: Not on file   Years of education: Not on file   Highest education level: Not on file  Occupational History   Occupation: Marine scientist  Tobacco Use   Smoking status: Every Day    Packs/day: 1.00    Types: Cigarettes   Smokeless tobacco: Never  Vaping Use   Vaping Use: Never used  Substance and Sexual Activity   Alcohol use: Yes    Alcohol/week: 21.0 standard drinks    Types: 21 Glasses of wine per week    Comment: 3 glasses of wine per day   Drug use: Never   Sexual activity: Not Currently  Other Topics Concern   Not on file  Social History Narrative   Not on file   Social Determinants of Health   Financial Resource Strain: Not on file  Food Insecurity: Not on file  Transportation Needs: Not on file  Physical Activity: Not on file  Stress: Not on file  Social Connections: Not on file           Objective:  Physical Exam: BP 124/77   Pulse 73   Temp 98.1 F (36.7 C) (Temporal)   Ht 5\' 8"  (1.727 m)   Wt 193 lb 6.4 oz (87.7 kg)   SpO2 97%   BMI 29.41 kg/m   Gen: No acute distress, resting comfortably CV: Regular rate and rhythm with no murmurs appreciated Pulm: Normal work of breathing, clear to  auscultation bilaterally with no crackles, wheezes, or rhonchi Skin: Faintly erythematous rash involving space in skin folds along inferior breast and umbilicus. Neuro: Grossly normal, moves all extremities Psych: Normal affect and thought content      I,Jordan Kelly,acting as a scribe for Dimas Chyle, MD.,have documented all relevant documentation on the behalf of Dimas Chyle, MD,as directed by  Dimas Chyle, MD while in the presence of Dimas Chyle, MD.  I, Dimas Chyle, MD, have reviewed all documentation for this visit. The documentation on 12/11/20 for the exam, diagnosis, procedures, and orders are all accurate and complete.  Time Spent: 45 minutes of total time was spent on the date  of the encounter performing the following actions: chart review prior to seeing the patient including records form previous PCPs and specialists, obtaining history, performing a medically necessary exam, counseling on the treatment plan, placing orders, and documenting in our EHR. This time does not include time spent discussing smoking cessation.   [name]. [name], MD 12/11/2020 9:38 AM

## 2020-12-11 NOTE — Assessment & Plan Note (Signed)
Check Vit D 

## 2020-12-11 NOTE — Assessment & Plan Note (Signed)
Continue meclizine as needed.  She is working on low-sodium diet.

## 2020-12-11 NOTE — Assessment & Plan Note (Signed)
Patient was asked about her tobacco use today and was strongly advised to quit. Patient is currently non contemplative. We reviewed treatment options to assist her quit smoking including NRT, Chantix, and Bupropion. Follow up at next office visit.   Total time spent counseling approximately 3 minutes.

## 2020-12-11 NOTE — Telephone Encounter (Signed)
Patient is called in stating that she didn't need vitamin D drawn she was needing B12, wondering if it can be added.

## 2020-12-11 NOTE — Patient Instructions (Addendum)
It was very nice to see you today!  Please try taking a small dose of benefiber or metamucil to help regulate your bowl movements.   Please start the nystatin and triamcinolone.  I will refill your Crestor.  We will check vitamin D today.  I will see you back in a year.  Come back to see me sooner if needed.  Take care, Dr [name]  PLEASE NOTE:  If you had any lab tests please let us know if you have not heard back within a few days. You may see your results on mychart before we have a chance to review them but we will give you a call once they are reviewed by [place]. If we ordered any referrals today, please let us know if you have not heard from their office within the next week.   Please try these tips to maintain a healthy lifestyle:  Eat at least 3 REAL meals and 1-2 snacks per day.  Aim for no more than 5 hours between eating.  If you eat breakfast, please do so within one hour of getting up.   Each meal should contain half fruits/vegetables, one quarter protein, and one quarter carbs (no bigger than a computer mouse)  Cut down on sweet beverages. This includes juice, soda, and sweet tea.   Drink at least 1 glass of water with each meal and aim for at least 8 glasses per day  Exercise at least 150 minutes every week.

## 2020-12-11 NOTE — Progress Notes (Signed)
Please inform patient of the following:  Her vitamin D level is NORMAL.  [name]. [name], MD 12/11/2020 3:41 PM

## 2020-12-11 NOTE — Assessment & Plan Note (Signed)
Last LDL 97.  Continue Crestor 5 mg daily

## 2020-12-12 ENCOUNTER — Ambulatory Visit
Admission: RE | Admit: 2020-12-12 | Discharge: 2020-12-12 | Disposition: A | Discharge status: Home / Self Care | Payer: Medicare Other | Source: Ambulatory Visit

## 2020-12-12 ENCOUNTER — Encounter: Payer: Self-pay | Admitting: Family Medicine

## 2020-12-12 DIAGNOSIS — Z1231 Encounter for screening mammogram for malignant neoplasm of breast: Secondary | ICD-10-CM

## 2020-12-13 ENCOUNTER — Other Ambulatory Visit: Payer: Self-pay | Admitting: *Deleted

## 2020-12-13 DIAGNOSIS — E538 Deficiency of other specified B group vitamins: Secondary | ICD-10-CM

## 2020-12-13 DIAGNOSIS — E559 Vitamin D deficiency, unspecified: Secondary | ICD-10-CM

## 2020-12-13 NOTE — Telephone Encounter (Signed)
LVM to schedule appointment with Lab. Order placed

## 2020-12-13 NOTE — Telephone Encounter (Signed)
B12 labs added

## 2020-12-13 NOTE — Telephone Encounter (Signed)
Ok to add B12 ? Please advise

## 2020-12-14 ENCOUNTER — Other Ambulatory Visit (INDEPENDENT_AMBULATORY_CARE_PROVIDER_SITE_OTHER): Payer: Medicare Other

## 2020-12-14 ENCOUNTER — Other Ambulatory Visit: Payer: Self-pay

## 2020-12-14 DIAGNOSIS — E538 Deficiency of other specified B group vitamins: Secondary | ICD-10-CM | POA: Diagnosis not present

## 2020-12-14 LAB — VITAMIN B12: Vitamin B-12: 201 pg/mL — ABNORMAL LOW (ref 211–911)

## 2020-12-14 NOTE — Telephone Encounter (Signed)
Please see her other messages in the chart. It is ok for her to get a B12 level drawn.  [name]. [name], MD 12/14/2020 7:57 AM

## 2020-12-14 NOTE — Progress Notes (Signed)
Please inform patient of the following:  B12 is low but everything else is stable. Recommend we start B12 protocol here.  [name]. [name], MD 12/14/2020 3:54 PM

## 2020-12-26 ENCOUNTER — Ambulatory Visit: Payer: Medicare Other | Admitting: Family Medicine

## 2021-01-01 NOTE — Progress Notes (Signed)
[facility] [place] [address] [place] Phone: [phone] Subjective:   [name], am serving as a scribe for Dr. Hulan Saas. This visit occurred during the SARS-CoV-2 public health emergency.  Safety protocols were in place, including screening questions prior to the visit, additional usage of staff PPE, and extensive cleaning of exam room while observing appropriate contact time as indicated for disinfecting solutions.   I'm seeing this patient by the request  of:  Vivi Barrack, MD  CC: Bilateral knee pain follow-up  [id number]  10/13/2020 Chronic, with mild exacerbation.  Still responds relatively well to osteopathic manipulation.  Discussed which activities to doing which wants to avoid.  Increase activity slowly.  Patient is remaining active doing such things as riding an ATV.  Bilateral steroid injections given in October 13, 2020.  Responded well to the injections.  Discussed icing regimen.  Patient will continue to be active otherwise.  Patient is a candidate for viscosupplementation and would consider repeating that in the next 2 months.  Follow-up with me again 2 months  Update 01/02/2021 Kimberly [name] is a 70 y.o. female coming in with complaint of B knee pain.  Fairly severe arthritis.  Patient states in injections in both knees. Baker cyst on the left side is bothering her. No new complaints.       Past Medical History:  Diagnosis Date   Arthritis    Cataract    Diverticulosis    Meniere's disease, bilateral    Osteopenia    Past Surgical History:  Procedure Laterality Date   ABDOMINAL HYSTERECTOMY     CHOLECYSTECTOMY     EYE SURGERY     age 71   THYROIDECTOMY, PARTIAL     Social History   Socioeconomic History   Marital status: Single    Spouse name: Not on file   Number of children: Not on file   Years of education: Not on file   Highest education level: Not on file  Occupational History    Occupation: Marine scientist  Tobacco Use   Smoking status: Every Day    Packs/day: 1.00    Types: Cigarettes   Smokeless tobacco: Never  Vaping Use   Vaping Use: Never used  Substance and Sexual Activity   Alcohol use: Yes    Alcohol/week: 21.0 standard drinks    Types: 21 Glasses of wine per week    Comment: 3 glasses of wine per day   Drug use: Never   Sexual activity: Not Currently  Other Topics Concern   Not on file  Social History Narrative   Not on file   Social Determinants of Health   Financial Resource Strain: Not on file  Food Insecurity: Not on file  Transportation Needs: Not on file  Physical Activity: Not on file  Stress: Not on file  Social Connections: Not on file   Allergies  Allergen Reactions   Erythromycin Diarrhea   Clindamycin/Lincomycin Nausea And Vomiting   Morphine And Related Itching   Latex Rash   Family History  Problem Relation Age of Onset   Colon polyps Mother    Heart disease Mother    Heart disease Father    Stomach cancer Paternal Grandfather      Current Outpatient Medications (Cardiovascular):    rosuvastatin (CRESTOR) 5 MG tablet, Take 1 tablet (5 mg total) by mouth daily.  Current Outpatient Medications (Respiratory):    cetirizine (ZYRTEC) 10 MG tablet, Take 10 mg  by mouth daily.    Current Outpatient Medications (Other):    Calcium Carbonate-Vit D-Min (CALCIUM 1200 PO), Take by mouth daily.   gabapentin (NEURONTIN) 100 MG capsule, Take 2 capsules (200 mg total) by mouth at bedtime.   gentamicin cream (GARAMYCIN) 0.1 %, Apply 1 application topically 2 (two) times daily.   meclizine (ANTIVERT) 25 MG tablet, Take 1 tablet (25 mg total) by mouth 3 (three) times daily as needed for dizziness.   Multiple Vitamins-Minerals (HAIR SKIN AND NAILS FORMULA PO), Take by mouth.   Multiple Vitamins-Minerals (WOMENS MULTIVITAMIN PO), Take by mouth.   nystatin cream (MYCOSTATIN), Apply 1 application topically 2 (two) times daily.    triamcinolone cream (KENALOG) 0.1 %, Apply 1 application topically 2 (two) times daily.   vitamin E 200 UNIT capsule, Take 200 Units by mouth daily.   Reviewed prior external information including notes and imaging from  primary care provider As well as notes that were available from care everywhere and other healthcare systems.  Past medical history, social, surgical and family history all reviewed in electronic medical record.  No pertanent information unless stated regarding to the chief complaint.   Review of Systems:  No headache, visual changes, nausea, vomiting, diarrhea, constipation, dizziness, abdominal pain, skin rash, fevers, chills, night sweats, weight loss, swollen lymph nodes, body aches, joint swelling, chest pain, shortness of breath, mood changes. POSITIVE muscle aches  Objective  Blood pressure 126/68, pulse 85, height 5\' 8"  (1.727 m), weight 196 lb (88.9 kg), SpO2 97 %.   General: No apparent distress alert and oriented x3 mood and affect normal, dressed appropriately.  HEENT: Pupils equal, extraocular movements intact  Respiratory: Patient's speak in full sentences and does not appear short of breath  Cardiovascular: No lower extremity edema, non tender, no erythema  Gait mild antalgic Knee: Bilateral valgus deformity noted. Large thigh to calf ratio.  Tender to palpation over medial and PF joint line.  ROM full in flexion and extension and lower leg rotation. instability with valgus force.  painful patellar compression. Patellar glide with moderate crepitus. Patellar and quadriceps tendons unremarkable. Hamstring and quadriceps strength is normal.  After informed written and verbal consent, patient was seated on exam table. Right knee was prepped with alcohol swab and utilizing anterolateral approach, patient's right knee space was injected with 48 mg per 3 mL of Monovisc (sodium hyaluronate) in a prefilled syringe was injected easily into the knee through a  22-gauge needle..Patient tolerated the procedure well without immediate complications.  After informed written and verbal consent, patient was seated on exam table. Left knee was prepped with alcohol swab and utilizing anterolateral approach, patient's left knee space was injected with 48 mg per 3 mL of Monovisc (sodium hyaluronate) in a prefilled syringe was injected easily into the knee through a 22-gauge needle..Patient tolerated the procedure well without immediate complications.   Impression and Recommendations:     The above documentation has been reviewed and is accurate and complete Lyndal Pulley, DO

## 2021-01-02 ENCOUNTER — Other Ambulatory Visit: Payer: Self-pay

## 2021-01-02 ENCOUNTER — Ambulatory Visit: Payer: Self-pay

## 2021-01-02 ENCOUNTER — Ambulatory Visit (INDEPENDENT_AMBULATORY_CARE_PROVIDER_SITE_OTHER): Payer: Medicare Other | Admitting: Family Medicine

## 2021-01-02 VITALS — BP 126/68 | HR 85 | Ht 68.0 in | Wt 196.0 lb

## 2021-01-02 DIAGNOSIS — M7121 Synovial cyst of popliteal space [Baker], right knee: Secondary | ICD-10-CM | POA: Diagnosis not present

## 2021-01-02 DIAGNOSIS — M170 Bilateral primary osteoarthritis of knee: Secondary | ICD-10-CM | POA: Diagnosis not present

## 2021-01-02 NOTE — Patient Instructions (Signed)
Injected knees See me again in 6 weeks

## 2021-01-02 NOTE — Assessment & Plan Note (Signed)
Patient given injection today and tolerated the procedure well of the viscosupplementation.  Patient has responded well to this in the past and hopefully we will be able to do it again.  Can also go back to the steroid if needed and can repeat the gel injection every 6 months if needed.  Chronic problem with exacerbation.  Follow-up with me again in 6 to 8 weeks otherwise.

## 2021-01-03 ENCOUNTER — Encounter: Payer: Self-pay | Admitting: Family Medicine

## 2021-02-08 NOTE — Progress Notes (Signed)
Kimberly [name] [address] [place] Phone: [phone] Subjective:   [name], am serving as a scribe for Dr. Hulan Saas. This visit occurred during the SARS-CoV-2 public health emergency.  Safety protocols were in place, including screening questions prior to the visit, additional usage of staff PPE, and extensive cleaning of exam room while observing appropriate contact time as indicated for disinfecting solutions.   I'm seeing this patient by the request  of:  Kimberly Barrack, MD  CC: bilateral knee pain   [id number]  01/02/2021 Patient given injection today and tolerated the procedure well of the viscosupplementation.  Patient has responded well to this in the past and hopefully we will be able to do it again.  Can also go back to the steroid if needed and can repeat the gel injection every 6 months if needed.  Chronic problem with exacerbation.  Follow-up with me again in 6 to 8 weeks otherwise.  Update 02/13/2021 Kimberly [name] is a 70 y.o. female coming in with complaint of B knee pain. Monovisc given at last visit. Patient states that her pain is "ok". Patient has had less pain with stair climbing.   Notes achiness in B ankles. Pain is constant and more medial. Tried Voltaren gel and this has not helped.       Past Medical History:  Diagnosis Date   Arthritis    Cataract    Diverticulosis    Meniere's disease, bilateral    Osteopenia    Past Surgical History:  Procedure Laterality Date   ABDOMINAL HYSTERECTOMY     CHOLECYSTECTOMY     EYE SURGERY     age 83   THYROIDECTOMY, PARTIAL     Social History   Socioeconomic History   Marital status: Single    Spouse name: Not on file   Number of children: Not on file   Years of education: Not on file   Highest education level: Not on file  Occupational History   Occupation: Marine scientist  Tobacco Use   Smoking status: Every Day    Packs/day: 1.00    Types:  Cigarettes   Smokeless tobacco: Never  Vaping Use   Vaping Use: Never used  Substance and Sexual Activity   Alcohol use: Yes    Alcohol/week: 21.0 standard drinks    Types: 21 Glasses of wine per week    Comment: 3 glasses of wine per day   Drug use: Never   Sexual activity: Not Currently  Other Topics Concern   Not on file  Social History Narrative   Not on file   Social Determinants of Health   Financial Resource Strain: Not on file  Food Insecurity: Not on file  Transportation Needs: Not on file  Physical Activity: Not on file  Stress: Not on file  Social Connections: Not on file   Allergies  Allergen Reactions   Erythromycin Diarrhea   Clindamycin/Lincomycin Nausea And Vomiting   Morphine And Related Itching   Latex Rash   Family History  Problem Relation Age of Onset   Colon polyps Mother    Heart disease Mother    Heart disease Father    Stomach cancer Paternal Grandfather      Current Outpatient Medications (Cardiovascular):    rosuvastatin (CRESTOR) 5 MG tablet, Take 1 tablet (5 mg total) by mouth daily.  Current Outpatient Medications (Respiratory):    cetirizine (ZYRTEC) 10 MG tablet, Take 10 mg by mouth daily.  Current Outpatient Medications (Other):    Calcium Carbonate-Vit D-Min (CALCIUM 1200 PO), Take by mouth daily.   gabapentin (NEURONTIN) 100 MG capsule, Take 2 capsules (200 mg total) by mouth at bedtime.   gentamicin cream (GARAMYCIN) 0.1 %, Apply 1 application topically 2 (two) times daily.   meclizine (ANTIVERT) 25 MG tablet, Take 1 tablet (25 mg total) by mouth 3 (three) times daily as needed for dizziness.   Multiple Vitamins-Minerals (HAIR SKIN AND NAILS FORMULA PO), Take by mouth.   Multiple Vitamins-Minerals (WOMENS MULTIVITAMIN PO), Take by mouth.   nystatin cream (MYCOSTATIN), Apply 1 application topically 2 (two) times daily.   triamcinolone cream (KENALOG) 0.1 %, Apply 1 application topically 2 (two) times daily.   vitamin E 200  UNIT capsule, Take 200 Units by mouth daily.   Reviewed prior external information including notes and imaging from  primary care provider As well as notes that were available from care everywhere and other healthcare systems.  Past medical history, social, surgical and family history all reviewed in electronic medical record.  No pertanent information unless stated regarding to the chief complaint.   Review of Systems:  No headache, visual changes, nausea, vomiting, diarrhea, constipation, dizziness, abdominal pain, skin rash, fevers, chills, night sweats, weight loss, swollen lymph nodes, body aches, joint swelling, chest pain, shortness of breath, mood changes. POSITIVE muscle aches  Objective  Blood pressure 126/76, pulse 88, height 5\' 8"  (1.727 m), weight 196 lb (88.9 kg), SpO2 96 %.   General: No apparent distress alert and oriented x3 mood and affect normal, dressed appropriately.  HEENT: Pupils equal, extraocular movements intact  Respiratory: Patient's speak in full sentences and does not appear short of breath  Cardiovascular: No lower extremity edema, non tender, no erythema  Gait normal with good balance and coordination.  MSK: Foot exam shows the patient does have pes planus bilaterally.  He does have a narrow foot noted as well. Patient's low back does have loss of lordosis.  Tenderness to palpation in the paraspinal musculature.  Tightness with FABER test right greater than left.   Osteopathic findings C4 flexed rotated and side bent left C6 flexed rotated and side bent left T3 extended rotated and side bent right inhaled third rib T8 extended rotated and side bent left L2 flexed rotated and side bent right Sacrum right on right    Impression and Recommendations:     The above documentation has been reviewed and is accurate and complete Lyndal Pulley, DO

## 2021-02-13 ENCOUNTER — Ambulatory Visit (INDEPENDENT_AMBULATORY_CARE_PROVIDER_SITE_OTHER): Payer: Medicare Other | Admitting: Family Medicine

## 2021-02-13 ENCOUNTER — Encounter: Payer: Self-pay | Admitting: Family Medicine

## 2021-02-13 ENCOUNTER — Ambulatory Visit: Payer: Medicare Other

## 2021-02-13 ENCOUNTER — Other Ambulatory Visit: Payer: Self-pay

## 2021-02-13 VITALS — BP 126/76 | HR 88 | Ht 68.0 in | Wt 196.0 lb

## 2021-02-13 DIAGNOSIS — M2141 Flat foot [pes planus] (acquired), right foot: Secondary | ICD-10-CM

## 2021-02-13 DIAGNOSIS — M9904 Segmental and somatic dysfunction of sacral region: Secondary | ICD-10-CM

## 2021-02-13 DIAGNOSIS — M9908 Segmental and somatic dysfunction of rib cage: Secondary | ICD-10-CM

## 2021-02-13 DIAGNOSIS — M9901 Segmental and somatic dysfunction of cervical region: Secondary | ICD-10-CM

## 2021-02-13 DIAGNOSIS — M25571 Pain in right ankle and joints of right foot: Secondary | ICD-10-CM | POA: Diagnosis not present

## 2021-02-13 DIAGNOSIS — M2142 Flat foot [pes planus] (acquired), left foot: Secondary | ICD-10-CM

## 2021-02-13 DIAGNOSIS — M9902 Segmental and somatic dysfunction of thoracic region: Secondary | ICD-10-CM

## 2021-02-13 DIAGNOSIS — M545 Low back pain, unspecified: Secondary | ICD-10-CM | POA: Diagnosis not present

## 2021-02-13 DIAGNOSIS — M170 Bilateral primary osteoarthritis of knee: Secondary | ICD-10-CM

## 2021-02-13 DIAGNOSIS — M25572 Pain in left ankle and joints of left foot: Secondary | ICD-10-CM | POA: Diagnosis not present

## 2021-02-13 DIAGNOSIS — E538 Deficiency of other specified B group vitamins: Secondary | ICD-10-CM

## 2021-02-13 DIAGNOSIS — M9903 Segmental and somatic dysfunction of lumbar region: Secondary | ICD-10-CM

## 2021-02-13 DIAGNOSIS — M999 Biomechanical lesion, unspecified: Secondary | ICD-10-CM

## 2021-02-13 DIAGNOSIS — M2140 Flat foot [pes planus] (acquired), unspecified foot: Secondary | ICD-10-CM | POA: Insufficient documentation

## 2021-02-13 NOTE — Assessment & Plan Note (Signed)
Still concern for the possibility of more of a lumbar radiculopathy.  Patient does have pes planus of the feet though bilaterally left greater than right with overpronation noted to the hindfoot.  Could be contributing to some of the knee pain as well patient is going to start B12 injections and we will see how patient responds to it.  Changes to the gabapentin.  Follow-up again in 4 to 8 weeks.

## 2021-02-13 NOTE — Patient Instructions (Addendum)
Good to see you as always [place] the beach See you again in 6 weeks

## 2021-02-13 NOTE — Assessment & Plan Note (Signed)
   Decision today to treat with OMT was based on Physical Exam  After verbal consent patient was treated with  ME, FPR techniques in cervical, thoracic, rib, lumbar and sacral areas, all areas are chronic   Patient tolerated the procedure well with improvement in symptoms  Patient given exercises, stretches and lifestyle modifications  See medications in patient instructions if given  Patient will follow up in 4-8 weeks 

## 2021-02-13 NOTE — Assessment & Plan Note (Signed)
Known arthritic changes.  Has not noticed much improvement with the viscosupplementation this time.  We discussed with patient icing regimen and home exercises.  Discussed which activities to do which wants to avoid.  Increase activity slowly.  Follow-up again in 6 to 8 weeks.

## 2021-02-13 NOTE — Assessment & Plan Note (Signed)
Starting supplementation at another facility.  Discussed with patient can take up to 3 months to notice improvement.

## 2021-02-13 NOTE — Assessment & Plan Note (Signed)
Pes planus left greater than right.  Could be giving some discomfort more of the knees and the lower back.  I do feel that custom orthotics could be beneficial to help with a lot of the pain the patient is having.  Discussed which activities to do which wants to avoid.  Could consider possible x-rays but do not think it would change management at the moment.  If continuing to have pain we will consider it at follow-up.  Follow-up again in 6 weeks

## 2021-02-16 ENCOUNTER — Encounter: Payer: Medicare Other | Admitting: Family Medicine

## 2021-03-05 ENCOUNTER — Encounter: Payer: Self-pay | Admitting: Family Medicine

## 2021-03-05 NOTE — Telephone Encounter (Signed)
Please advise 

## 2021-03-05 NOTE — Telephone Encounter (Signed)
Ok to place referral.  [name]. [name], MD 03/05/2021 2:02 PM

## 2021-03-06 ENCOUNTER — Other Ambulatory Visit: Payer: Self-pay | Admitting: *Deleted

## 2021-03-06 ENCOUNTER — Ambulatory Visit (INDEPENDENT_AMBULATORY_CARE_PROVIDER_SITE_OTHER): Payer: Medicare Other | Admitting: *Deleted

## 2021-03-06 ENCOUNTER — Other Ambulatory Visit: Payer: Self-pay

## 2021-03-06 DIAGNOSIS — E538 Deficiency of other specified B group vitamins: Secondary | ICD-10-CM | POA: Diagnosis not present

## 2021-03-06 DIAGNOSIS — H8103 Meniere's disease, bilateral: Secondary | ICD-10-CM

## 2021-03-06 MED ORDER — CYANOCOBALAMIN 1000 MCG/ML IJ SOLN
1000.0000 ug | Freq: Once | INTRAMUSCULAR | Status: AC
  Administered 2021-03-06: 10:00:00 1000 ug via INTRAMUSCULAR

## 2021-03-06 NOTE — Progress Notes (Signed)
I have reviewed the patient's encounter and agree with the documentation.  Kimberly [name]. [name], MD 03/06/2021 10:29 AM

## 2021-03-06 NOTE — Progress Notes (Signed)
Patient came into the office for weekly B 12 injection per orders of Dr. [name]. Injection given in right deltoid per patient preference. Patient tolerated well.  Zacarias Pontes,  CMA

## 2021-03-06 NOTE — Telephone Encounter (Signed)
Referral placed.

## 2021-03-07 ENCOUNTER — Ambulatory Visit (INDEPENDENT_AMBULATORY_CARE_PROVIDER_SITE_OTHER): Payer: Medicare Other | Admitting: Sports Medicine

## 2021-03-07 VITALS — BP 142/82 | Ht 68.0 in | Wt 196.0 lb

## 2021-03-07 DIAGNOSIS — M2142 Flat foot [pes planus] (acquired), left foot: Secondary | ICD-10-CM

## 2021-03-07 DIAGNOSIS — M2141 Flat foot [pes planus] (acquired), right foot: Secondary | ICD-10-CM | POA: Diagnosis present

## 2021-03-07 NOTE — Progress Notes (Signed)
Kimberly [name] is a pleasant 71 year-old female who presents for custom orthotics today. History of pes planus bilaterally. She has/has not had custom orthotics made before, but has been trying some OTC orthotics she had gotten years ago that are more gel-like. She was sent over by Dr. [name].  Patient was fitted for a: standard, cushioned, semi-rigid orthotic. The orthotic was heated and afterward the patient stood on the orthotic blank positioned on the orthotic stand. The patient was positioned in subtalar neutral position and 10 degrees of ankle dorsiflexion in a weight bearing stance. After completion of molding, a stable base was applied to the orthotic blank. The blank was ground to a stable position for weight bearing. Size: 10 (men's) Base: blue EVA Posting: none Additional orthotic padding: none  Total time spent with the patient was 30 minutes with greater than 50% of the time spent in face-to-face consultation discussing orthotic construction, instruction, and sitting. Gait was neutral with orthotics in place. Patient found them to be comfortable. Follow-up as needed.  - Did show patient the "heel lock" shoestring tie, which she felt more secure  [name], DO PGY-4, [place]  Addendum:  I was the preceptor for this visit and available for immediate consultation.  Karlton Lemon MD Kirt Boys

## 2021-03-13 ENCOUNTER — Ambulatory Visit (INDEPENDENT_AMBULATORY_CARE_PROVIDER_SITE_OTHER): Payer: Medicare Other

## 2021-03-13 ENCOUNTER — Other Ambulatory Visit: Payer: Self-pay

## 2021-03-13 DIAGNOSIS — E538 Deficiency of other specified B group vitamins: Secondary | ICD-10-CM

## 2021-03-13 MED ORDER — CYANOCOBALAMIN 1000 MCG/ML IJ SOLN
1000.0000 ug | Freq: Once | INTRAMUSCULAR | Status: AC
  Administered 2021-03-13: 1000 ug via INTRAMUSCULAR

## 2021-03-13 NOTE — Progress Notes (Signed)
Kimberly [name] 71 yr old female presents to office today for 2nd of 4 weekly B12 injections per Dimas Chyle, MD. Administered CYANOCOBALAMIN 1,000 mcg IM right arm. Patient tolerated well. Aware to return next week for 3rd injection.

## 2021-03-15 ENCOUNTER — Other Ambulatory Visit: Payer: Self-pay

## 2021-03-15 ENCOUNTER — Ambulatory Visit: Payer: Medicare Other | Attending: Family Medicine | Admitting: Audiology

## 2021-03-15 DIAGNOSIS — H903 Sensorineural hearing loss, bilateral: Secondary | ICD-10-CM | POA: Insufficient documentation

## 2021-03-15 NOTE — Procedures (Signed)
°  [facility] [place], [place]  [phone]  AUDIOLOGICAL  EVALUATION  NAME: Kimberly [name]     DOB:   1950-10-17      MRN: [id number]                                                                                     DATE: 03/15/2021     REFERENT: Vivi Barrack, MD STATUS: Outpatient DIAGNOSIS: Sensorineural hearing loss, bilateral   History: Rayana was seen for an audiological evaluation due to a history of Meniere's disease. Shalese reports she was diagnosed with Meniere's in 1993. She was treated with diuretics and was recommended to start a low sodium diet. Jamilya had her second reported Meniere's attack in 2022. She was treated with Meclizine. Emerita reports bilateral aural fullness worse in the right ear. She denies otalgia and tinnitus. Leonda reports her episodes of dizziness are more "off-balance" and denies episodes of vertigo. Anecia denies concerns regarding her hearing sensitivity.   Evaluation:  Otoscopy showed a clear view of the tympanic membranes, bilaterally Tympanometry results were consistent with normal middle ear pressure and normal tympanic membrane mobility, bilaterally.  Audiometric testing was completed using Conventional Audiometry techniques with insert earphones and TDH headphones. Test results are consistent with normal hearing sensitivity at 203 550 6260 Hz sloping to a mild sensorineural hearing loss in the right ear and a moderate sensorineural hearing loss in the left ear. Sensorineural asymmetry noted at 4000 Hz, worse in the left ear.  Speech Recognition Thresholds were obtained at 15 dB HL in the right ear and at 25  dB HL in the left ear. Word Recognition Testing was completed at 55 dB HL in the right ear and Gabriellah scored 100% and was completed at 65 dB HL in the left ear and Shantell scored 96%.   Results:  Today's results are consistent with normal hearing sensitivity at 203 550 6260 Hz sloping to a mild sensorineural  hearing loss in the right ear and a moderate sensorineural hearing loss in the left ear. Sensorineural asymmetry noted at 4000 Hz, worse in the left ear. Katlin may have hearing difficulty in adverse listening situations. She will benefit from the use of good communication strategies. The test results were reviewed with Lelon Frohlich.   Recommendations: 1.   Follow up with ENT for right fullness sensation and management of Meniere's disease.  2.   Monitor hearing sensitivity as needed.       If you have any questions please feel free to contact me at [phone].  Bari Mantis Audiologist, Au.D., CCC-A 03/15/2021  2:16 PM  Cc: Vivi Barrack, MD

## 2021-03-20 ENCOUNTER — Ambulatory Visit (INDEPENDENT_AMBULATORY_CARE_PROVIDER_SITE_OTHER): Payer: Medicare Other

## 2021-03-20 ENCOUNTER — Other Ambulatory Visit: Payer: Self-pay

## 2021-03-20 DIAGNOSIS — E538 Deficiency of other specified B group vitamins: Secondary | ICD-10-CM | POA: Diagnosis not present

## 2021-03-20 MED ORDER — CYANOCOBALAMIN 1000 MCG/ML IJ SOLN
1000.0000 ug | Freq: Once | INTRAMUSCULAR | Status: AC
  Administered 2021-03-20: 1000 ug via INTRAMUSCULAR

## 2021-03-20 NOTE — Progress Notes (Signed)
Kimberly [name] 71 yr old female presents to office for 3rd of 4 weekly injections per Dimas Chyle, MD. Administered CYANOCOBALAMIN 1,000 mcg IM right arm. Patient tolerated well. Aware to return next week.

## 2021-03-22 ENCOUNTER — Telehealth: Payer: Self-pay | Admitting: Family Medicine

## 2021-03-22 NOTE — Telephone Encounter (Signed)
..  Type of form received: Application for residents   Additional comments:   Received [id number] [place]zz[place]   Form should be Faxed to: [phone]  Form should be mailed to:    Is patient requesting call for pickup:   Form placed:  in providers box   Attach charge sheet. Yes   Individual made aware of 3-5 business day turn around (Y/N)?

## 2021-03-26 NOTE — Progress Notes (Signed)
[place] Phone: [phone] Subjective:   [name], am serving as a scribe for Dr. Hulan Saas.  This visit occurred during the SARS-CoV-2 public health emergency.  Safety protocols were in place, including screening questions prior to the visit, additional usage of staff PPE, and extensive cleaning of exam room while observing appropriate contact time as indicated for disinfecting solutions.  I'm seeing this patient by the request  of:  Vivi Barrack, MD  CC:   [id number]  Kimberly [name] is a 71 y.o. female coming in with complaint of back and neck pain. OMT 02/13/2021. Also f/u for B knee and ankle pain. Patient did have orthotics made at [facility]. Patient states that she used orthotics for 1 week but developed pain in top of right foot. Patient then sprained her ankle when misstepping off steps. No pain in ankle but pain worsened in R foot. Using Aleve for pain. Pain in arch that feels like something is "tearing". Patient states that she has history of osteopenia and wonders if she has any fractures from orthotics or the fall. Patient has been using walking boot intermittently that she got from a family member which helped her pain but then boot became a nuisance to the inside of ankle.   Also complaining of increase in lower back pain when lying on her back.   Medications patient has been prescribed: None  Taking:         Reviewed prior external information including notes and imaging from previsou exam, outside providers and external EMR if available.   As well as notes that were available from care everywhere and other healthcare systems.  Past medical history, social, surgical and family history all reviewed in electronic medical record.  No pertanent information unless stated regarding to the chief complaint.   Past Medical History:  Diagnosis Date   Arthritis    Cataract     Diverticulosis    Meniere's disease, bilateral    Osteopenia     Allergies  Allergen Reactions   Erythromycin Diarrhea   Clindamycin/Lincomycin Nausea And Vomiting   Morphine And Related Itching   Latex Rash     Review of Systems:  No headache, visual changes, nausea, vomiting, diarrhea, constipation, dizziness, abdominal pain, skin rash, fevers, chills, night sweats, weight loss, swollen lymph nodes, body aches, joint swelling, chest pain, shortness of breath, mood changes. POSITIVE muscle aches  Objective  Blood pressure 124/76, pulse 95, height 5\' 8"  (1.727 m), weight 196 lb (88.9 kg), SpO2 97 %.   General: No apparent distress alert and oriented x3 mood and affect normal, dressed appropriately.  HEENT: Pupils equal, extraocular movements intact mild asymmetry noted Respiratory: Patient's speak in full sentences and does not appear short of breath  Low back exam does have some loss of lordosis.  Tender to palpation over the sacroiliac joint on the left side.  Positive FABER test noted. Right foot exam does have some mild swelling over the dorsal foot.  Patient does have some tenderness to palpation as well.  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       Assessment and Plan:  Metatarsal bone fracture Patient does  have what appears to be a transverse metatarsal fracture.  Seems to be more acute on chronic.  Discussed with patient about icing regimen and home exercises, discussed which activities to do and which ones to avoid.  Increase activity slowly.  Patient was put in a cam walker.  Follow-up again in 2 to 3 weeks.  We will ultrasound at that time and further evaluate.  Low back pain potentially associated with spinal  stenosis Responding well to manipulation.  Pain over the sacroiliac joint.  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  We discussed the possibility that if we keep having worsening symptoms I would likely consider advanced imaging patient wants to continue with conservative therapy.  Does respond well to manipulation.  Follow-up again in 2 to 3 weeks  Nonallopathic lesion of lumbar region   Decision today to treat with OMT was based on Physical Exam  After verbal consent patient was treated with HVLA, ME, FPR techniques in cervical, thoracic, rib, lumbar and sacral areas, all areas are chronic   Patient tolerated the procedure well with improvement in symptoms  Patient given exercises, stretches and lifestyle modifications  See medications in patient instructions if given  Patient will follow up in 4-8 weeks   Nonallopathic problems  Decision today to treat with OMT was based on Physical Exam  After verbal consent patient was treated with HVLA, ME, FPR techniques in cervical, rib, thoracic, lumbar, and sacral  areas  Patient tolerated the procedure well with improvement in symptoms  Patient given exercises, stretches and lifestyle modifications  See medications in patient instructions if given  Patient will follow up in 4-8 weeks      The above documentation has been reviewed and is accurate and complete Lyndal Pulley, DO      Note: This dictation was prepared with Dragon dictation along with smaller phrase technology. Any transcriptional errors that result from this process are unintentional.

## 2021-03-27 ENCOUNTER — Encounter: Payer: Self-pay | Admitting: Family Medicine

## 2021-03-27 ENCOUNTER — Ambulatory Visit (INDEPENDENT_AMBULATORY_CARE_PROVIDER_SITE_OTHER): Payer: Medicare Other | Admitting: *Deleted

## 2021-03-27 ENCOUNTER — Other Ambulatory Visit: Payer: Self-pay

## 2021-03-27 ENCOUNTER — Ambulatory Visit (INDEPENDENT_AMBULATORY_CARE_PROVIDER_SITE_OTHER): Payer: Medicare Other | Admitting: Family Medicine

## 2021-03-27 ENCOUNTER — Ambulatory Visit (INDEPENDENT_AMBULATORY_CARE_PROVIDER_SITE_OTHER): Payer: Medicare Other

## 2021-03-27 VITALS — BP 124/76 | HR 95 | Ht 68.0 in | Wt 196.0 lb

## 2021-03-27 DIAGNOSIS — E538 Deficiency of other specified B group vitamins: Secondary | ICD-10-CM

## 2021-03-27 DIAGNOSIS — M9901 Segmental and somatic dysfunction of cervical region: Secondary | ICD-10-CM

## 2021-03-27 DIAGNOSIS — S92334A Nondisplaced fracture of third metatarsal bone, right foot, initial encounter for closed fracture: Secondary | ICD-10-CM | POA: Diagnosis not present

## 2021-03-27 DIAGNOSIS — M9904 Segmental and somatic dysfunction of sacral region: Secondary | ICD-10-CM | POA: Diagnosis not present

## 2021-03-27 DIAGNOSIS — M79672 Pain in left foot: Secondary | ICD-10-CM

## 2021-03-27 DIAGNOSIS — M545 Low back pain, unspecified: Secondary | ICD-10-CM

## 2021-03-27 DIAGNOSIS — M9903 Segmental and somatic dysfunction of lumbar region: Secondary | ICD-10-CM | POA: Diagnosis not present

## 2021-03-27 DIAGNOSIS — S92309A Fracture of unspecified metatarsal bone(s), unspecified foot, initial encounter for closed fracture: Secondary | ICD-10-CM | POA: Insufficient documentation

## 2021-03-27 DIAGNOSIS — M9902 Segmental and somatic dysfunction of thoracic region: Secondary | ICD-10-CM

## 2021-03-27 DIAGNOSIS — M9908 Segmental and somatic dysfunction of rib cage: Secondary | ICD-10-CM

## 2021-03-27 DIAGNOSIS — M79671 Pain in right foot: Secondary | ICD-10-CM

## 2021-03-27 DIAGNOSIS — M999 Biomechanical lesion, unspecified: Secondary | ICD-10-CM

## 2021-03-27 MED ORDER — CYANOCOBALAMIN 1000 MCG/ML IJ SOLN
1000.0000 ug | Freq: Once | INTRAMUSCULAR | Status: AC
  Administered 2021-03-27: 1000 ug via INTRAMUSCULAR

## 2021-03-27 NOTE — Progress Notes (Signed)
Kimberly [name] 71 yr old female presents to office for 4th of 4 weekly injections per Dimas Chyle, MD.  Administered CYANOCOBALAMIN 1,000 mcg IM left arm per patient preference. Patient tolerated well.  Zacarias Pontes, CMA

## 2021-03-27 NOTE — Assessment & Plan Note (Signed)
Patient does have what appears to be a transverse metatarsal fracture.  Seems to be more acute on chronic.  Discussed with patient about icing regimen and home exercises, discussed which activities to do and which ones to avoid.  Increase activity slowly.  Patient was put in a cam walker.  Follow-up again in 2 to 3 weeks.  We will ultrasound at that time and further evaluate.

## 2021-03-27 NOTE — Assessment & Plan Note (Signed)

## 2021-03-27 NOTE — Patient Instructions (Signed)
Cam walker  Keep doing Vit D See me in 2-3 weeks

## 2021-03-27 NOTE — Assessment & Plan Note (Signed)
Responding well to manipulation.  Pain over the sacroiliac joint.  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  We discussed the possibility that if we keep having worsening symptoms I would likely consider advanced imaging patient wants to continue with conservative therapy.  Does respond well to manipulation.  Follow-up again in 2 to 3 weeks

## 2021-03-28 NOTE — Telephone Encounter (Signed)
Place on Dr [name] office to be sign

## 2021-04-03 NOTE — Telephone Encounter (Signed)
Form faxed to [phone]

## 2021-04-06 NOTE — Progress Notes (Signed)
[place] [address] [place] Phone: [phone] Subjective:   Kimberly [name], am serving as a scribe for Dr. Hulan Saas. This visit occurred during the SARS-CoV-2 public health emergency.  Safety protocols were in place, including screening questions prior to the visit, additional usage of staff PPE, and extensive cleaning of exam room while observing appropriate contact time as indicated for disinfecting solutions.   I'm seeing this patient by the request  of:  Vivi Barrack, MD  CC: Bilateral knee, foot pain  [id number]  03/27/2021 Responding well to manipulation.  Pain over the sacroiliac joint.  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  We discussed the possibility that if we keep having worsening symptoms I would likely consider advanced imaging patient wants to continue with conservative therapy.  Does respond well to manipulation.  Follow-up again in 2 to 3 weeks  Patient does have what appears to be a transverse metatarsal fracture.  Seems to be more acute on chronic.  Discussed with patient about icing regimen and home exercises, discussed which activities to do and which ones to avoid.  Increase activity slowly.  Patient was put in a cam walker.  Follow-up again in 2 to 3 weeks.  We will ultrasound at that time and further evaluate.  Update 04/09/2021 Kimberly [name] is a 71 y.o. female coming in with complaint of R foot fx and LBP. Patient states foot seems better. Swelling makes it painful sometimes. Very cautious of being out of the boot. No new complaints. Back feels about the same.      Past Medical History:  Diagnosis Date   Arthritis    Cataract    Diverticulosis    Meniere's disease, bilateral    Osteopenia    Past Surgical History:  Procedure Laterality Date   ABDOMINAL HYSTERECTOMY     CHOLECYSTECTOMY     EYE SURGERY     age 68   THYROIDECTOMY, PARTIAL     Social History   Socioeconomic History    Marital status: Single    Spouse name: Not on file   Number of children: Not on file   Years of education: Not on file   Highest education level: Not on file  Occupational History   Occupation: Marine scientist  Tobacco Use   Smoking status: Every Day    Packs/day: 1.00    Types: Cigarettes   Smokeless tobacco: Never  Vaping Use   Vaping Use: Never used  Substance and Sexual Activity   Alcohol use: Yes    Alcohol/week: 21.0 standard drinks    Types: 21 Glasses of wine per week    Comment: 3 glasses of wine per day   Drug use: Never   Sexual activity: Not Currently  Other Topics Concern   Not on file  Social History Narrative   Not on file   Social Determinants of Health   Financial Resource Strain: Not on file  Food Insecurity: Not on file  Transportation Needs: Not on file  Physical Activity: Not on file  Stress: Not on file  Social Connections: Not on file   Allergies  Allergen Reactions   Erythromycin Diarrhea   Clindamycin/Lincomycin Nausea And Vomiting   Morphine And Related Itching   Latex Rash   Family History  Problem Relation Age of Onset   Colon polyps Mother    Heart disease Mother    Heart disease Father    Stomach cancer Paternal Grandfather  Current Outpatient Medications (Cardiovascular):    rosuvastatin (CRESTOR) 5 MG tablet, Take 1 tablet (5 mg total) by mouth daily.  Current Outpatient Medications (Respiratory):    cetirizine (ZYRTEC) 10 MG tablet, Take 10 mg by mouth daily.    Current Outpatient Medications (Other):    Vitamin D, Ergocalciferol, (DRISDOL) 1.25 MG (50000 UNIT) CAPS capsule, Take 1 capsule (50,000 Units total) by mouth every 7 (seven) days.   Calcium Carbonate-Vit D-Min (CALCIUM 1200 PO), Take by mouth daily.   gabapentin (NEURONTIN) 100 MG capsule, Take 2 capsules (200 mg total) by mouth at bedtime.   gentamicin cream (GARAMYCIN) 0.1 %, Apply 1 application topically 2 (two) times daily.   meclizine (ANTIVERT) 25 MG  tablet, Take 1 tablet (25 mg total) by mouth 3 (three) times daily as needed for dizziness.   Multiple Vitamins-Minerals (HAIR SKIN AND NAILS FORMULA PO), Take by mouth.   Multiple Vitamins-Minerals (WOMENS MULTIVITAMIN PO), Take by mouth.   nystatin cream (MYCOSTATIN), Apply 1 application topically 2 (two) times daily.   triamcinolone cream (KENALOG) 0.1 %, Apply 1 application topically 2 (two) times daily.   vitamin E 200 UNIT capsule, Take 200 Units by mouth daily.   Reviewed prior external information including notes and imaging from  primary care provider As well as notes that were available from care everywhere and other healthcare systems.  Past medical history, social, surgical and family history all reviewed in electronic medical record.  No pertanent information unless stated regarding to the chief complaint.   Review of Systems:  No headache, visual changes, nausea, vomiting, diarrhea, constipation, dizziness, abdominal pain, skin rash, fevers, chills, night sweats, weight loss, swollen lymph nodes, body aches, joint swelling, chest pain, shortness of breath, mood changes. POSITIVE muscle aches, joint swelling  Objective  Pulse 80, height 5\' 8"  (1.727 m), weight 197 lb (89.4 kg), SpO2 96 %.   General: No apparent distress alert and oriented x3 mood and affect normal, dressed appropriately.  HEENT: Pupils equal, extraocular movements intact  Respiratory: Patient's speak in full sentences and does not appear short of breath  Cardiovascular: 2+ pitting edema on the right and 1+ pitting edema on the left Gait antalgic MSK: Foot exam shows the patient still has swelling noted of the lower extremity.  2+ pitting edema on the right compared to 1+ pitting edema on the left of the foot.  Tender to palpation more over the third and fourth metatarsals.   Limited muscular skeletal ultrasound was performed and interpreted by Hulan Saas, M  Right foot exam shows the patient has third  metatarsal in the area of question is have some callus formation occurring.  This seems to be new at this time.  Patient still has some hypoechoic changes and increasing neovascularization. Impression: Right foot healing transverse fracture with callus formation but not hard callus.   Impression and Recommendations:     The above documentation has been reviewed and is accurate and complete Lyndal Pulley, DO

## 2021-04-09 ENCOUNTER — Ambulatory Visit (INDEPENDENT_AMBULATORY_CARE_PROVIDER_SITE_OTHER): Payer: Medicare Other | Admitting: Family Medicine

## 2021-04-09 ENCOUNTER — Other Ambulatory Visit: Payer: Self-pay

## 2021-04-09 ENCOUNTER — Ambulatory Visit: Payer: Self-pay

## 2021-04-09 VITALS — HR 80 | Ht 68.0 in | Wt 197.0 lb

## 2021-04-09 DIAGNOSIS — S92334A Nondisplaced fracture of third metatarsal bone, right foot, initial encounter for closed fracture: Secondary | ICD-10-CM

## 2021-04-09 MED ORDER — VITAMIN D (ERGOCALCIFEROL) 1.25 MG (50000 UNIT) PO CAPS: 50000.0000 [IU] | ORAL_CAPSULE | ORAL | 0 refills | Status: DC

## 2021-04-09 NOTE — Patient Instructions (Addendum)
Vit d for 4 weeks Boot for another 2 weeks See you again in 2 weeks okay to double

## 2021-04-09 NOTE — Assessment & Plan Note (Signed)
Making progress at this time, continue the boot for another 2 weeks, patient does have a normal vitamin D but will increase with a once weekly for 4 weeks and then discontinue and go back to the daily.  Discussed icing regimen and home exercises.  Discussed continuing the cam walker and likely will need to further evaluate her back at follow-up.

## 2021-04-20 NOTE — Progress Notes (Unsigned)
[place] Phone: [phone] Subjective:    I'm seeing this patient by the request  of:  Vivi Barrack, MD  CC:   [id number]  04/09/2021 Making progress at this time, continue the boot for another 2 weeks, patient does have a normal vitamin D but will increase with a once weekly for 4 weeks and then discontinue and go back to the daily.  Discussed icing regimen and home exercises.  Discussed continuing the cam walker and likely will need to further evaluate her back at follow-up.  Update 04/24/2021 Kimberly [name] is a 71 y.o. female coming in with complaint of L foot pain. Patient states       Past Medical History:  Diagnosis Date   Arthritis    Cataract    Diverticulosis    Meniere's disease, bilateral    Osteopenia    Past Surgical History:  Procedure Laterality Date   ABDOMINAL HYSTERECTOMY     CHOLECYSTECTOMY     EYE SURGERY     age 89   THYROIDECTOMY, PARTIAL     Social History   Socioeconomic History   Marital status: Single    Spouse name: Not on file   Number of children: Not on file   Years of education: Not on file   Highest education level: Not on file  Occupational History   Occupation: Marine scientist  Tobacco Use   Smoking status: Every Day    Packs/day: 1.00    Types: Cigarettes   Smokeless tobacco: Never  Vaping Use   Vaping Use: Never used  Substance and Sexual Activity   Alcohol use: Yes    Alcohol/week: 21.0 standard drinks    Types: 21 Glasses of wine per week    Comment: 3 glasses of wine per day   Drug use: Never   Sexual activity: Not Currently  Other Topics Concern   Not on file  Social History Narrative   Not on file   Social Determinants of Health   Financial Resource Strain: Not on file  Food Insecurity: Not on file  Transportation Needs: Not on file  Physical Activity: Not on file  Stress: Not on file  Social Connections: Not on file   Allergies   Allergen Reactions   Erythromycin Diarrhea   Clindamycin/Lincomycin Nausea And Vomiting   Morphine And Related Itching   Latex Rash   Family History  Problem Relation Age of Onset   Colon polyps Mother    Heart disease Mother    Heart disease Father    Stomach cancer Paternal Grandfather      Current Outpatient Medications (Cardiovascular):    rosuvastatin (CRESTOR) 5 MG tablet, Take 1 tablet (5 mg total) by mouth daily.  Current Outpatient Medications (Respiratory):    cetirizine (ZYRTEC) 10 MG tablet, Take 10 mg by mouth daily.    Current Outpatient Medications (Other):    Calcium Carbonate-Vit D-Min (CALCIUM 1200 PO), Take by mouth daily.   gabapentin (NEURONTIN) 100 MG capsule, Take 2 capsules (200 mg total) by mouth at bedtime.   gentamicin cream (GARAMYCIN) 0.1 %, Apply 1 application topically 2 (two) times daily.   meclizine (ANTIVERT) 25 MG tablet, Take 1 tablet (25 mg total) by mouth 3 (three) times daily as needed for dizziness.   Multiple Vitamins-Minerals (HAIR SKIN AND NAILS FORMULA PO), Take by mouth.   Multiple Vitamins-Minerals (WOMENS MULTIVITAMIN PO), Take by mouth.   nystatin cream (MYCOSTATIN), Apply 1 application  topically 2 (two) times daily.   triamcinolone cream (KENALOG) 0.1 %, Apply 1 application topically 2 (two) times daily.   Vitamin D, Ergocalciferol, (DRISDOL) 1.25 MG (50000 UNIT) CAPS capsule, Take 1 capsule (50,000 Units total) by mouth every 7 (seven) days.   vitamin E 200 UNIT capsule, Take 200 Units by mouth daily.   Reviewed prior external information including notes and imaging from  primary care provider As well as notes that were available from care everywhere and other healthcare systems.  Past medical history, social, surgical and family history all reviewed in electronic medical record.  No pertanent information unless stated regarding to the chief complaint.   Review of Systems:  No headache, visual changes, nausea, vomiting,  diarrhea, constipation, dizziness, abdominal pain, skin rash, fevers, chills, night sweats, weight loss, swollen lymph nodes, body aches, joint swelling, chest pain, shortness of breath, mood changes. POSITIVE muscle aches  Objective  There were no vitals taken for this visit.   General: No apparent distress alert and oriented x3 mood and affect normal, dressed appropriately.  HEENT: Pupils equal, extraocular movements intact  Respiratory: Patient's speak in full sentences and does not appear short of breath  Cardiovascular: No lower extremity edema, non tender, no erythema  Gait normal with good balance and coordination.  MSK:  Non tender with full range of motion and good stability and symmetric strength and tone of shoulders, elbows, wrist, hip, knee and ankles bilaterally.     Impression and Recommendations:     The above documentation has been reviewed and is accurate and complete [name]

## 2021-04-24 ENCOUNTER — Ambulatory Visit (INDEPENDENT_AMBULATORY_CARE_PROVIDER_SITE_OTHER): Payer: Medicare Other | Admitting: Family Medicine

## 2021-04-24 ENCOUNTER — Ambulatory Visit: Payer: Self-pay

## 2021-04-24 ENCOUNTER — Ambulatory Visit: Payer: Medicare Other

## 2021-04-24 ENCOUNTER — Encounter: Payer: Self-pay | Admitting: Family Medicine

## 2021-04-24 ENCOUNTER — Other Ambulatory Visit: Payer: Self-pay

## 2021-04-24 VITALS — BP 116/84 | HR 75 | Ht 68.0 in | Wt 197.0 lb

## 2021-04-24 DIAGNOSIS — M79671 Pain in right foot: Secondary | ICD-10-CM | POA: Diagnosis not present

## 2021-04-24 DIAGNOSIS — M545 Low back pain, unspecified: Secondary | ICD-10-CM

## 2021-04-24 DIAGNOSIS — E538 Deficiency of other specified B group vitamins: Secondary | ICD-10-CM

## 2021-04-24 DIAGNOSIS — S92334A Nondisplaced fracture of third metatarsal bone, right foot, initial encounter for closed fracture: Secondary | ICD-10-CM

## 2021-04-24 NOTE — Progress Notes (Signed)
After obtaining consent, and per orders of Dr. Jonni Sanger, injection of Vitamin B 12 given by Loralyn Freshwater.  ?

## 2021-04-24 NOTE — Assessment & Plan Note (Signed)
Continues to have some low back pain.  Patient wants to be stable at the moment.  Held on any type of manipulation until patient is out of the cam walker. ?

## 2021-04-24 NOTE — Patient Instructions (Signed)
Out of boot! ?Ice is your friend ?When sitting try to move foot ?See me in 5-6 weeks ?

## 2021-04-24 NOTE — Assessment & Plan Note (Signed)
Patient at this point seem to have more of a synovitis at this point.  Do not see any other chart of the cortical irregularity anymore at this moment.  Patient has tightness of the posterior aspect of the foot as well that I think is contributing to some of the discomfort and pain as well.  Discussed home exercises and icing regimen.  Patient is to get out of the boot.  If worsening pain I would consider the possibility of injection in the first metatarsal.  Follow-up again in 6 to 8 weeks ?

## 2021-04-24 NOTE — Progress Notes (Deleted)
? ?  Kimberly [name] is a 71 y.o. female who presents today for an office visit. ? ?Assessment/Plan:  ?New/Acute Problems: ?*** ? ?Chronic Problems Addressed Today: ?No problem-specific Assessment & Plan notes found for this encounter. ? ? ?  ?Subjective:  ?HPI: ? ?***  ? ?   ?  ?Objective:  ?Physical Exam: ?There were no vitals taken for this visit.  ?Gen: No acute distress, resting comfortably*** ?CV: Regular rate and rhythm with no murmurs appreciated ?Pulm: Normal work of breathing, clear to auscultation bilaterally with no crackles, wheezes, or rhonchi ?Neuro: Grossly normal, moves all extremities ?Psych: Normal affect and thought content ? ?   ? ?chondromalacia patella ? ?

## 2021-05-03 ENCOUNTER — Telehealth: Payer: Self-pay | Admitting: Family Medicine

## 2021-05-04 NOTE — Telephone Encounter (Signed)
Patient called to see if Dr Tamala Julian wanted her to continue to take the Vit D prescription or if she was just supposed to do it for one month? ? ?Please advise. ? ?

## 2021-05-07 NOTE — Telephone Encounter (Signed)
Sent patient MyChart message with recommendations.  

## 2021-05-22 ENCOUNTER — Ambulatory Visit (INDEPENDENT_AMBULATORY_CARE_PROVIDER_SITE_OTHER): Payer: Medicare Other

## 2021-05-22 DIAGNOSIS — E538 Deficiency of other specified B group vitamins: Secondary | ICD-10-CM | POA: Diagnosis not present

## 2021-05-22 MED ORDER — CYANOCOBALAMIN 1000 MCG/ML IJ SOLN
1000.0000 ug | Freq: Once | INTRAMUSCULAR | Status: AC
  Administered 2021-05-22: 1000 ug via INTRAMUSCULAR

## 2021-05-22 NOTE — Progress Notes (Signed)
Per orders of Dr. [name], injection of B-12 given by Tobe Sos in right deltoid. ?Patient tolerated injection well. Patient will make appointment for 1 month. ? ?

## 2021-06-04 NOTE — Progress Notes (Signed)
?Kimberly [name] D.O. ?[facility] ?[place] ?Phone: [phone] ?Subjective:   ?I, Kimberly [name], am serving as a scribe for Dr. Hulan Saas. ? ?This visit occurred during the SARS-CoV-2 public health emergency.  Safety protocols were in place, including screening questions prior to the visit, additional usage of staff PPE, and extensive cleaning of exam room while observing appropriate contact time as indicated for disinfecting solutions.  ? ? ?I'm seeing this patient by the request  of:  Vivi Barrack, MD ? ?CC: Low back pain ? ?DHR:[id number]  ?04/24/2021 ?Patient at this point seem to have more of a synovitis at this point.  Do not see any other chart of the cortical irregularity anymore at this moment.  Patient has tightness of the posterior aspect of the foot as well that I think is contributing to some of the discomfort and pain as well.  Discussed home exercises and icing regimen.  Patient is to get out of the boot.  If worsening pain I would consider the possibility of injection in the first metatarsal.  Follow-up again in 6 to 8 weeks ? ?Continues to have some low back pain.  Patient wants to be stable at the moment.  Held on any type of manipulation until patient is out of the cam walker. ? ?Update 06/05/2021 ?Kimberly [name] is a 71 y.o. female coming in with complaint of R foot and low back pain. Patient states that her pain is worse over metatarsals. Patient has been on feet due to moving. Using D3 daily. States that pain is back to where it was initially. Also notes  B knee pain which she feels is contributing.  ? ? ?  ? ?Past Medical History:  ?Diagnosis Date  ? Arthritis   ? Cataract   ? Diverticulosis   ? Meniere's disease, bilateral   ? Osteopenia   ? ?Past Surgical History:  ?Procedure Laterality Date  ? ABDOMINAL HYSTERECTOMY    ? CHOLECYSTECTOMY    ? EYE SURGERY    ? age 2  ? THYROIDECTOMY, PARTIAL    ? ?Social History  ? ?Socioeconomic History  ? Marital  status: Single  ?  Spouse name: Not on file  ? Number of children: Not on file  ? Years of education: Not on file  ? Highest education level: Not on file  ?Occupational History  ? Occupation: Marine scientist  ?Tobacco Use  ? Smoking status: Every Day  ?  Packs/day: 1.00  ?  Types: Cigarettes  ? Smokeless tobacco: Never  ?Vaping Use  ? Vaping Use: Never used  ?Substance and Sexual Activity  ? Alcohol use: Yes  ?  Alcohol/week: 21.0 standard drinks  ?  Types: 21 Glasses of wine per week  ?  Comment: 3 glasses of wine per day  ? Drug use: Never  ? Sexual activity: Not Currently  ?Other Topics Concern  ? Not on file  ?Social History Narrative  ? Not on file  ? ?Social Determinants of Health  ? ?Financial Resource Strain: Not on file  ?Food Insecurity: Not on file  ?Transportation Needs: Not on file  ?Physical Activity: Not on file  ?Stress: Not on file  ?Social Connections: Not on file  ? ?Allergies  ?Allergen Reactions  ? Erythromycin Diarrhea  ? Clindamycin/Lincomycin Nausea And Vomiting  ? Morphine And Related Itching  ? Latex Rash  ? ?Family History  ?Problem Relation Age of Onset  ? Colon polyps Mother   ?  Heart disease Mother   ? Heart disease Father   ? Stomach cancer Paternal Grandfather   ? ? ? ?Current Outpatient Medications (Cardiovascular):  ?  rosuvastatin (CRESTOR) 5 MG tablet, Take 1 tablet (5 mg total) by mouth daily. ? ?Current Outpatient Medications (Respiratory):  ?  cetirizine (ZYRTEC) 10 MG tablet, Take 10 mg by mouth daily. ? ? ? ?Current Outpatient Medications (Other):  ?  Calcium Carbonate-Vit D-Min (CALCIUM 1200 PO), Take by mouth daily. ?  gabapentin (NEURONTIN) 100 MG capsule, Take 2 capsules (200 mg total) by mouth at bedtime. ?  gentamicin cream (GARAMYCIN) 0.1 %, Apply 1 application topically 2 (two) times daily. ?  meclizine (ANTIVERT) 25 MG tablet, Take 1 tablet (25 mg total) by mouth 3 (three) times daily as needed for dizziness. ?  Multiple Vitamins-Minerals (HAIR SKIN AND NAILS FORMULA  PO), Take by mouth. ?  Multiple Vitamins-Minerals (WOMENS MULTIVITAMIN PO), Take by mouth. ?  nystatin cream (MYCOSTATIN), Apply 1 application topically 2 (two) times daily. ?  triamcinolone cream (KENALOG) 0.1 %, Apply 1 application topically 2 (two) times daily. ?  Vitamin D, Ergocalciferol, (DRISDOL) 1.25 MG (50000 UNIT) CAPS capsule, TAKE ONE CAPSULE BY MOUTH ONCE WEEKLY ?  vitamin E 200 UNIT capsule, Take 200 Units by mouth daily. ? ? ?Reviewed prior external information including notes and imaging from  ?primary care provider ?As well as notes that were available from care everywhere and other healthcare systems. ? ?Past medical history, social, surgical and family history all reviewed in electronic medical record.  No pertanent information unless stated regarding to the chief complaint.  ? ?Review of Systems: ? No headache, visual changes, nausea, vomiting, diarrhea, constipation, dizziness, abdominal pain, skin rash, fevers, chills, night sweats, weight loss, swollen lymph nodes, body aches, joint swelling, chest pain, shortness of breath, mood changes. POSITIVE muscle aches ? ?Objective  ?Blood pressure 128/84, pulse 73, height '5\' 8"'$  (1.727 m), weight 198 lb (89.8 kg), SpO2 98 %. ?  ?General: No apparent distress alert and oriented x3 mood and affect normal, dressed appropriately.  ?HEENT: Pupils equal, extraocular movements intact  ?Respiratory: Patient's speak in full sentences and does not appear short of breath  ?Cardiovascular: No lower extremity edema, non tender, no erythema  ?Gait normal with good balance and coordination.  ?MSK: Knee exam is showing effusion noted of the right knee.  Patient does have tenderness to palpation bilaterally of the medial joint space and only the patellofemoral joint space on the right side.  Lacks last 10 degrees of flexion and lacks 5 degrees of extension.  Instability with varus and valgus force ? ?Low back exam does have significant tightness noted in.  Lacks last  10 degrees of extension of the back.  Tightness with even flexion of the back. ? ?Repeat exam still has some loss of lordosis.  Patient has minimal tenderness to palpation on exam mostly over the.  MTP.  Rigid midfoot noted.  Pes planus noted otherwise ? ?Limited muscular skeletal ultrasound was performed and interpreted by Hulan Saas, M  ?Limited ultrasound of patient's foot show this no cortical irregularity noted today no significant hypoechoic changes noted either.  Mild increase in Doppler flow around the third metatarsal. ?Impression: Specific findings of the third metatarsal of the right foot. ? ?After informed written and verbal consent, patient was seated on exam table. Right knee was prepped with alcohol swab and utilizing anterolateral approach, patient's right knee space was injected with 4:1  marcaine 0.5%: Kenalog '40mg'$ /dL. Patient  tolerated the procedure well without immediate complications. ? ?After informed written and verbal consent, patient was seated on exam table. Left knee was prepped with alcohol swab and utilizing anterolateral approach, patient's left knee space was injected with 4:1  marcaine 0.5%: Kenalog '40mg'$ /dL. Patient tolerated the procedure well without immediate complications. ? ? ?Impression and Recommendations:  ?  ? ?The above documentation has been reviewed and is accurate and complete Lyndal Pulley, DO ? ? ? ?

## 2021-06-05 ENCOUNTER — Ambulatory Visit (INDEPENDENT_AMBULATORY_CARE_PROVIDER_SITE_OTHER): Payer: Medicare Other | Admitting: Family Medicine

## 2021-06-05 ENCOUNTER — Ambulatory Visit: Payer: Medicare Other

## 2021-06-05 ENCOUNTER — Encounter: Payer: Self-pay | Admitting: Family Medicine

## 2021-06-05 VITALS — BP 128/84 | HR 73 | Ht 68.0 in | Wt 198.0 lb

## 2021-06-05 DIAGNOSIS — M2141 Flat foot [pes planus] (acquired), right foot: Secondary | ICD-10-CM

## 2021-06-05 DIAGNOSIS — M170 Bilateral primary osteoarthritis of knee: Secondary | ICD-10-CM | POA: Diagnosis not present

## 2021-06-05 DIAGNOSIS — M79671 Pain in right foot: Secondary | ICD-10-CM

## 2021-06-05 DIAGNOSIS — M545 Low back pain, unspecified: Secondary | ICD-10-CM

## 2021-06-05 DIAGNOSIS — M2142 Flat foot [pes planus] (acquired), left foot: Secondary | ICD-10-CM

## 2021-06-05 NOTE — Assessment & Plan Note (Signed)
Patient has moved recently and I think that that is contributing to more of the discomfort in the forefoot pain.  I do not see any cortical irregularity this time.  Discussed going back to the cam walker for 1 week.  Patient then can transition into a more rigid soled shoes.  Overall should do relatively well with conservative therapy.  Follow-up again in 6 to 8 weeks otherwise. ?

## 2021-06-05 NOTE — Assessment & Plan Note (Signed)
Arthritic changes bilaterally right greater than left.  Patient had a small effusion noted on the right side as well today.  Patient responded well to the injections, discussed icing regimen and home exercises, which activities to do which ones to avoid, increase activity slowly.  Follow-up again in 6 to 8 weeks otherwise. ?

## 2021-06-05 NOTE — Patient Instructions (Addendum)
Injected both knees today ?We will get gel approval ?Boot for a week ?See me in  6-8 weeks ?

## 2021-06-05 NOTE — Assessment & Plan Note (Signed)
Chronic problem noted with mild exacerbation.  Likely contributing from patient having difficulty with her foot and knees.  Patient did respond well to manipulation.  Hold on any other type of injection at the moment.  Patient does have gabapentin or has had in the past as needed for breakthrough.  Follow-up again in 6 to 8 weeks ?

## 2021-06-19 ENCOUNTER — Ambulatory Visit (INDEPENDENT_AMBULATORY_CARE_PROVIDER_SITE_OTHER): Payer: Medicare Other | Admitting: *Deleted

## 2021-06-19 ENCOUNTER — Other Ambulatory Visit: Payer: Self-pay

## 2021-06-19 ENCOUNTER — Telehealth: Payer: Self-pay | Admitting: Family Medicine

## 2021-06-19 DIAGNOSIS — E538 Deficiency of other specified B group vitamins: Secondary | ICD-10-CM

## 2021-06-19 MED ORDER — CYANOCOBALAMIN 1000 MCG/ML IJ SOLN
1000.0000 ug | Freq: Once | INTRAMUSCULAR | Status: AC
  Administered 2021-06-19: 1000 ug via INTRAMUSCULAR

## 2021-06-19 NOTE — Progress Notes (Signed)
Per orders of Dr. [name], injection of Vitamin B 12 given right deltoid per patient preference by Zacarias Pontes, CMA. Patient tolerated injection well.  ?

## 2021-06-19 NOTE — Progress Notes (Signed)
I have reviewed the patient's encounter and agree with the documentation. ? ?[name]. [name], MD ?06/19/2021 9:26 AM  ? ?

## 2021-06-19 NOTE — Telephone Encounter (Signed)
Future Orders placed for Vitamin B-12 recheck; called pt and lvm to contact our office to schedule lab appt at her convenience.  ?

## 2021-06-19 NOTE — Telephone Encounter (Signed)
Ok to order B12 level. ? ?Kimberly [name]. [name], MD ?06/19/2021 9:43 AM  ? ?

## 2021-06-19 NOTE — Progress Notes (Deleted)
Per orders of Dr. [name], injection of Cyanocobalamin 1000 mcg/ml given IM by Anselmo Pickler in {Left/right:33004} deltoid. ?Patient tolerated injection well. Patient will make appointment for {NUMBER 1-10:22536}{Time; day/week/month:13537} ? ?

## 2021-06-19 NOTE — Telephone Encounter (Signed)
Today marks 7 B12 shots patient has received.  ?Patient would like to have blood work. Please add orders so that I can sch labs. Thank you!  ?

## 2021-06-19 NOTE — Telephone Encounter (Signed)
Please see note and advise  

## 2021-06-20 ENCOUNTER — Other Ambulatory Visit (INDEPENDENT_AMBULATORY_CARE_PROVIDER_SITE_OTHER): Payer: Medicare Other

## 2021-06-20 DIAGNOSIS — E538 Deficiency of other specified B group vitamins: Secondary | ICD-10-CM

## 2021-06-20 LAB — VITAMIN B12: Vitamin B-12: >1504 pg/mL — ABNORMAL HIGH (ref 211–911)

## 2021-06-22 NOTE — Progress Notes (Signed)
Please inform patient of the following: ? ?Her B12 is at a good range. She can stop the injections and maintain with oral supplementation.  Recommend 1000 mcg daily.  We can recheck again in 3 to 6 months

## 2021-07-31 ENCOUNTER — Ambulatory Visit: Payer: Self-pay

## 2021-07-31 ENCOUNTER — Ambulatory Visit (INDEPENDENT_AMBULATORY_CARE_PROVIDER_SITE_OTHER): Payer: Medicare Other | Admitting: Family Medicine

## 2021-07-31 VITALS — BP 124/82 | HR 83 | Ht 68.0 in | Wt 199.0 lb

## 2021-07-31 DIAGNOSIS — S93492A Sprain of other ligament of left ankle, initial encounter: Secondary | ICD-10-CM | POA: Diagnosis not present

## 2021-07-31 DIAGNOSIS — M79672 Pain in left foot: Secondary | ICD-10-CM

## 2021-07-31 DIAGNOSIS — M9903 Segmental and somatic dysfunction of lumbar region: Secondary | ICD-10-CM

## 2021-07-31 DIAGNOSIS — M9901 Segmental and somatic dysfunction of cervical region: Secondary | ICD-10-CM | POA: Diagnosis not present

## 2021-07-31 DIAGNOSIS — M9904 Segmental and somatic dysfunction of sacral region: Secondary | ICD-10-CM | POA: Diagnosis not present

## 2021-07-31 DIAGNOSIS — M999 Biomechanical lesion, unspecified: Secondary | ICD-10-CM

## 2021-07-31 DIAGNOSIS — M170 Bilateral primary osteoarthritis of knee: Secondary | ICD-10-CM | POA: Diagnosis not present

## 2021-07-31 DIAGNOSIS — M9908 Segmental and somatic dysfunction of rib cage: Secondary | ICD-10-CM

## 2021-07-31 DIAGNOSIS — M9902 Segmental and somatic dysfunction of thoracic region: Secondary | ICD-10-CM

## 2021-07-31 DIAGNOSIS — S93402A Sprain of unspecified ligament of left ankle, initial encounter: Secondary | ICD-10-CM | POA: Insufficient documentation

## 2021-07-31 DIAGNOSIS — M545 Low back pain, unspecified: Secondary | ICD-10-CM | POA: Diagnosis not present

## 2021-07-31 NOTE — Progress Notes (Addendum)
[facility] Phone: [phone] Subjective:    I'm seeing this patient by the request  of:  Vivi Barrack, MD  CC: Knee pain, back pain, foot pain  [id number]  06/05/2021 Patient has moved recently and I think that that is contributing to more of the discomfort in the forefoot pain.  I do not see any cortical irregularity this time.  Discussed going back to the cam walker for 1 week.  Patient then can transition into a more rigid soled shoes.  Overall should do relatively well with conservative therapy.  Follow-up again in 6 to 8 weeks otherwise.  Chronic problem noted with mild exacerbation.  Likely contributing from patient having difficulty with her foot and knees.  Patient did respond well to manipulation.  Hold on any other type of injection at the moment.  Patient does have gabapentin or has had in the past as needed for breakthrough.  Follow-up again in 6 to 8 weeks  Arthritic changes bilaterally right greater than left.  Patient had a small effusion noted on the right side as well today.  Patient responded well to the injections, discussed icing regimen and home exercises, which activities to do which ones to avoid, increase activity slowly.  Follow-up again in 6 to 8 weeks otherwise.  Updated 07/31/2021 SHANDEL BUSIC is a 71 y.o. female coming in with complaint of knee, feet, and back pain. Knees not doing well, would like gel injections today. Did something to left foot when she slipped in some flip flops on May 26th. Foot was swollen and bruised. Back is about the same. Wanting manipulation.       Past Medical History:  Diagnosis Date   Arthritis    Cataract    Diverticulosis    Meniere's disease, bilateral    Osteopenia    Past Surgical History:  Procedure Laterality Date   ABDOMINAL HYSTERECTOMY     CHOLECYSTECTOMY     EYE SURGERY     age 38   THYROIDECTOMY, PARTIAL     Social History    Socioeconomic History   Marital status: Single    Spouse name: Not on file   Number of children: Not on file   Years of education: Not on file   Highest education level: Not on file  Occupational History   Occupation: Marine scientist  Tobacco Use   Smoking status: Every Day    Packs/day: 1.00    Types: Cigarettes   Smokeless tobacco: Never  Vaping Use   Vaping Use: Never used  Substance and Sexual Activity   Alcohol use: Yes    Alcohol/week: 21.0 standard drinks of alcohol    Types: 21 Glasses of wine per week    Comment: 3 glasses of wine per day   Drug use: Never   Sexual activity: Not Currently  Other Topics Concern   Not on file  Social History Narrative   Not on file   Social Determinants of Health   Financial Resource Strain: Not on file  Food Insecurity: Not on file  Transportation Needs: Not on file  Physical Activity: Not on file  Stress: Not on file  Social Connections: Not on file   Allergies  Allergen Reactions   Erythromycin Diarrhea   Clindamycin/Lincomycin Nausea And Vomiting   Morphine And Related Itching   Latex Rash   Family History  Problem Relation Age of Onset   Colon polyps Mother    Heart  disease Mother    Heart disease Father    Stomach cancer Paternal Grandfather      Current Outpatient Medications (Cardiovascular):    rosuvastatin (CRESTOR) 5 MG tablet, Take 1 tablet (5 mg total) by mouth daily.  Current Outpatient Medications (Respiratory):    cetirizine (ZYRTEC) 10 MG tablet, Take 10 mg by mouth daily.    Current Outpatient Medications (Other):    Calcium Carbonate-Vit D-Min (CALCIUM 1200 PO), Take by mouth daily.   gabapentin (NEURONTIN) 100 MG capsule, Take 2 capsules (200 mg total) by mouth at bedtime.   gentamicin cream (GARAMYCIN) 0.1 %, Apply 1 application topically 2 (two) times daily.   meclizine (ANTIVERT) 25 MG tablet, Take 1 tablet (25 mg total) by mouth 3 (three) times daily as needed for dizziness.   Multiple  Vitamins-Minerals (HAIR SKIN AND NAILS FORMULA PO), Take by mouth.   Multiple Vitamins-Minerals (WOMENS MULTIVITAMIN PO), Take by mouth.   nystatin cream (MYCOSTATIN), Apply 1 application topically 2 (two) times daily.   triamcinolone cream (KENALOG) 0.1 %, Apply 1 application topically 2 (two) times daily.   Vitamin D, Ergocalciferol, (DRISDOL) 1.25 MG (50000 UNIT) CAPS capsule, TAKE ONE CAPSULE BY MOUTH ONCE WEEKLY   vitamin E 200 UNIT capsule, Take 200 Units by mouth daily.   Reviewed prior external information including notes and imaging from  primary care provider As well as notes that were available from care everywhere and other healthcare systems.  Past medical history, social, surgical and family history all reviewed in electronic medical record.  No pertanent information unless stated regarding to the chief complaint.   Review of Systems:  No headache, visual changes, nausea, vomiting, diarrhea, constipation, dizziness, abdominal pain, skin rash, fevers, chills, night sweats, weight loss, swollen lymph nodes, body aches, joint swelling, chest pain, shortness of breath, mood changes. POSITIVE muscle aches  Objective  Blood pressure 124/82, pulse 83, height '5\' 8"'$  (1.727 m), weight 199 lb (90.3 kg), SpO2 98 %.   General: No apparent distress alert and oriented x3 mood and affect normal, dressed appropriately.  HEENT: Pupils equal, extraocular movements intact  Respiratory: Patient's speak in full sentences and does not appear short of breath  Cardiovascular: No lower extremity edema, non tender, no erythema  Patient's left foot does have a small effusion that appears.  Tender to palpation over the ATFL.  Negative anterior drawer test noted. Patient does have a mild antalgic gait but is ambulatory  Low back exam does have some loss of lordosis.  Patient does have tightness with FABER test bilaterally.  Lacks the last 15 degrees of extension of the back.  Neurovascular intact  distally.  Instability noted of the knees bilaterally with valgus and varus force.  Trace effusion noted bilaterally.  Lacks the last 5 degrees of extension bilaterally.   Limited muscular skeletal ultrasound was performed and interpreted by Hulan Saas, M  Limited ultrasound shows the patient does have hypoechoic changes and increasing Doppler flow over the ATFL.  This is consistent with an acute injury.  Hypoechoic changes coming from the effusion as well noted. 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 Right knee was prepped with alcohol swab and utilizing anterolateral approach, patient's right knee space was injected with 48 mg per 3 mL of Monovisc (sodium hyaluronate) in a prefilled syringe was injected easily into the knee through a 22-gauge needle..Patient tolerated the procedure well without immediate complications.  After informed written and verbal consent, patient was seated on exam table. Left knee was prepped with alcohol swab and utilizing anterolateral approach, patient's left knee space was injected with 48 mg per 3 mL of Monovisc (sodium hyaluronate) in a prefilled syringe was injected easily into the knee through a 22-gauge needle..Patient tolerated the procedure well without immediate complications.  Osteopathic findings C2 flexed rotated and side bent right C4 flexed rotated and side bent left C6 flexed rotated and side bent left T3 extended rotated and side bent right inhaled third rib T9 extended rotated and side bent left L2 flexed rotated and side bent right Sacrum right on right     Impression and Recommendations:      The above documentation has been reviewed and is accurate and complete Lyndal Pulley, DO

## 2021-07-31 NOTE — Assessment & Plan Note (Signed)
Chronic problem with worsening symptoms.  Has responded well to the gel injections previously so hopefully will get more discussed icing regimen and home exercises.  Discussed avoiding certain activities.  Follow-up with me in 6 to 8 weeks

## 2021-07-31 NOTE — Assessment & Plan Note (Addendum)
Appears to be more of the ATFL the patient does have a small effusion of the joint as well.  Awaiting the x-rays to further evaluate for any other bony abnormality that could be contributing.  Aircast given today patient is able to ambulate with brace..  Discussed avoiding certain inversion type injuries at this point.  6 to 8 weeks otherwise

## 2021-07-31 NOTE — Patient Instructions (Addendum)
Gel injection in knees today Aircast for ankle Did manipulation See you again in 6-8 weeks

## 2021-07-31 NOTE — Assessment & Plan Note (Signed)

## 2021-07-31 NOTE — Assessment & Plan Note (Signed)
Patient has low some nerve impingement that I think is contributing.  Discussed icing regimen and home exercises.  Discussed continuing core strengthening.  Responding relatively well though to osteopathic manipulation as well.

## 2021-09-10 NOTE — Progress Notes (Addendum)
[place] Phone: [phone] Subjective:   Kimberly [name], am serving as a scribe for Dr. Hulan Saas.  I'm seeing this patient by the request  of:  Vivi Barrack, MD  CC: left ankle and back pain   [id number]  07/31/2021 Appears to be more of the ATFL the patient does have a small effusion of the joint as well.  Awaiting the x-rays to further evaluate for any other bony abnormality that could be contributing.  Aircast given today.  Discussed avoiding certain inversion type injuries at this point.  6 to 8 weeks otherwise  Update 09/11/2021 Kimberly [name] is a 71 y.o. female coming in with complaint of L ankle, knee and back pain. Patient given Aircast last visit. OMT done last visit as well.   Patient also has a chronic condition with her knee pain.  Significant arthritic changes.  Viscosupplementation given in June.    Patient states that her foot pain is worse since last visit. Pain over 5th metatarsal of L foot.   Also having pain in 2nd toe on R foot. Hammering occurring as well a decrease in sensation in that toe. Also having a "different sensation" in R foot over 3rd metatarsal and feels like fracture has not heeled. Wearing Saucony and NB with orthotics.   Patient feeling swelling and tightness in back of B knees.   Could use adjustment for her back but her foot and knee pain is more of a concern. Feels muscle is larger on one side than another. Using antiinflammatory daily.        Past Medical History:  Diagnosis Date   Arthritis    Cataract    Diverticulosis    Meniere's disease, bilateral    Osteopenia    Past Surgical History:  Procedure Laterality Date   ABDOMINAL HYSTERECTOMY     CHOLECYSTECTOMY     EYE SURGERY     age 14   THYROIDECTOMY, PARTIAL     Social History   Socioeconomic History   Marital status: Single    Spouse name: Not on file   Number of children: Not on file    Years of education: Not on file   Highest education level: Not on file  Occupational History   Occupation: Marine scientist  Tobacco Use   Smoking status: Every Day    Packs/day: 1.00    Types: Cigarettes   Smokeless tobacco: Never  Vaping Use   Vaping Use: Never used  Substance and Sexual Activity   Alcohol use: Yes    Alcohol/week: 21.0 standard drinks of alcohol    Types: 21 Glasses of wine per week    Comment: 3 glasses of wine per day   Drug use: Never   Sexual activity: Not Currently  Other Topics Concern   Not on file  Social History Narrative   Not on file   Social Determinants of Health   Financial Resource Strain: Not on file  Food Insecurity: Not on file  Transportation Needs: Not on file  Physical Activity: Not on file  Stress: Not on file  Social Connections: Not on file   Allergies  Allergen Reactions   Erythromycin Diarrhea   Clindamycin/Lincomycin Nausea And Vomiting   Morphine And Related Itching   Latex Rash   Family History  Problem Relation Age of Onset   Colon polyps Mother    Heart disease Mother    Heart disease Father  Stomach cancer Paternal Grandfather      Current Outpatient Medications (Cardiovascular):    rosuvastatin (CRESTOR) 5 MG tablet, Take 1 tablet (5 mg total) by mouth daily.  Current Outpatient Medications (Respiratory):    cetirizine (ZYRTEC) 10 MG tablet, Take 10 mg by mouth daily.    Current Outpatient Medications (Other):    Calcium Carbonate-Vit D-Min (CALCIUM 1200 PO), Take by mouth daily.   gabapentin (NEURONTIN) 100 MG capsule, Take 2 capsules (200 mg total) by mouth at bedtime.   gentamicin cream (GARAMYCIN) 0.1 %, Apply 1 application topically 2 (two) times daily.   meclizine (ANTIVERT) 25 MG tablet, Take 1 tablet (25 mg total) by mouth 3 (three) times daily as needed for dizziness.   Multiple Vitamins-Minerals (HAIR SKIN AND NAILS FORMULA PO), Take by mouth.   Multiple Vitamins-Minerals (WOMENS MULTIVITAMIN  PO), Take by mouth.   nystatin cream (MYCOSTATIN), Apply 1 application topically 2 (two) times daily.   triamcinolone cream (KENALOG) 0.1 %, Apply 1 application topically 2 (two) times daily.   Vitamin D, Ergocalciferol, (DRISDOL) 1.25 MG (50000 UNIT) CAPS capsule, TAKE ONE CAPSULE BY MOUTH ONCE WEEKLY   vitamin E 200 UNIT capsule, Take 200 Units by mouth daily.   Reviewed prior external information including notes and imaging from  primary care provider As well as notes that were available from care everywhere and other healthcare systems.  Past medical history, social, surgical and family history all reviewed in electronic medical record.  [name] unless stated regarding to the chief complaint.   Review of Systems:  [name] headache, visual changes, nausea, vomiting, diarrhea, constipation, dizziness, abdominal pain, skin rash, fevers, chills, night sweats, weight loss, swollen lymph nodes, body aches, joint swelling, chest pain, shortness of breath, mood changes. POSITIVE muscle aches  Objective  Blood pressure 108/80, pulse 73, height '5\' 8"'$  (1.727 m), weight 199 lb (90.3 kg), SpO2 97 %.   General: [name] apparent distress alert and oriented x3 mood and affect normal, dressed appropriately.  HEENT: Pupils equal, extraocular movements intact  Respiratory: Patient's speak in full sentences and does not appear short of breath  Cardiovascular: [name] lower extremity edema, non tender, [name] erythema  Antalgic gait noted.  Tenderness to palpation over the ankle laterally.  Seems to be more over the dorsal aspect of the foot.  Arthritic changes of the knees bilaterally with instability noted with valgus and varus force.  Neurovascular intact distally.   Limited muscular skeletal ultrasound was performed and interpreted by Hulan Saas, M  Limited ultrasound shows the patient does have a cortical irregularity noted over the dorsal aspect of the foot somewhat.  Seems to be a potential  nonhealing fracture noted. Impression: Questionable nonhealing fracture  After informed written and verbal consent, patient was seated on exam table. Right knee was prepped with alcohol swab and utilizing anterolateral approach, patient's right knee space was injected with 4:1  marcaine 0.5%: Kenalog '40mg'$ /dL. Patient tolerated the procedure well without immediate complications.  After informed written and verbal consent, patient was seated on exam table. Left knee was prepped with alcohol swab and utilizing anterolateral approach, patient's left knee space was injected with 4:1  marcaine 0.5%: Kenalog '40mg'$ /dL. Patient tolerated the procedure well without immediate complications.  Osteopathic findings T8 extended rotated and side bent left L3 flexed rotated and side bent left  Sacrum right on right    Impression and Recommendations:    The above documentation has been reviewed and is accurate and complete Lyndal Pulley, DO

## 2021-09-11 ENCOUNTER — Encounter: Payer: Self-pay | Admitting: Family Medicine

## 2021-09-11 ENCOUNTER — Ambulatory Visit: Payer: Medicare Other

## 2021-09-11 ENCOUNTER — Ambulatory Visit (INDEPENDENT_AMBULATORY_CARE_PROVIDER_SITE_OTHER): Payer: Medicare Other | Admitting: Family Medicine

## 2021-09-11 VITALS — BP 108/80 | HR 73 | Ht 68.0 in | Wt 199.0 lb

## 2021-09-11 DIAGNOSIS — S92334A Nondisplaced fracture of third metatarsal bone, right foot, initial encounter for closed fracture: Secondary | ICD-10-CM

## 2021-09-11 DIAGNOSIS — M25572 Pain in left ankle and joints of left foot: Secondary | ICD-10-CM

## 2021-09-11 DIAGNOSIS — M9904 Segmental and somatic dysfunction of sacral region: Secondary | ICD-10-CM | POA: Diagnosis not present

## 2021-09-11 DIAGNOSIS — M9903 Segmental and somatic dysfunction of lumbar region: Secondary | ICD-10-CM | POA: Diagnosis not present

## 2021-09-11 DIAGNOSIS — G8929 Other chronic pain: Secondary | ICD-10-CM

## 2021-09-11 DIAGNOSIS — M9902 Segmental and somatic dysfunction of thoracic region: Secondary | ICD-10-CM | POA: Diagnosis not present

## 2021-09-11 DIAGNOSIS — M170 Bilateral primary osteoarthritis of knee: Secondary | ICD-10-CM | POA: Diagnosis not present

## 2021-09-11 DIAGNOSIS — M545 Low back pain, unspecified: Secondary | ICD-10-CM

## 2021-09-11 NOTE — Patient Instructions (Addendum)
Injected both knees today Kimberly [name] will contact you regarding bone stimulator See me again in 6 weeks

## 2021-09-12 NOTE — Assessment & Plan Note (Signed)
Patient is now greater than 3 months out from the metatarsal bone fracture.  Seems to be delayed union.  At this point I do feel that a bone stimulator could be beneficial.  We will see if we can get this approved.  Follow-up again in 6 weeks otherwise

## 2021-09-12 NOTE — Assessment & Plan Note (Signed)
Worsening pain and did not respond as well to the viscosupplementation.  Still wants to avoid though any type of surgical intervention for this chronic problem with exacerbation.  Discussed icing regimen, topical anti-inflammatories, continue with once weekly vitamin D can be helpful.  Follow-up with me again in 6 to 8 weeks.

## 2021-09-12 NOTE — Assessment & Plan Note (Signed)
Low back does have loss of lordosis  Discussed HEP  Discussed which activities to do.  See me again in 4-6 weeks

## 2021-09-25 ENCOUNTER — Encounter: Payer: Self-pay | Admitting: Family Medicine

## 2021-10-16 NOTE — Progress Notes (Deleted)
  [place] Phone: [phone] Subjective:    I'm seeing this patient by the request  of:  Vivi Barrack, MD  CC: back and neck pain follow up   [id number]  Kimberly [name] is a 71 y.o. female coming in with complaint of back and neck pain. OMT 09/11/2021. Also f/u for L ankle and B knee pain. Patient states   Medications patient has been prescribed: Vit D  Taking:         Reviewed prior external information including notes and imaging from previsou exam, outside providers and external EMR if available.   As well as notes that were available from care everywhere and other healthcare systems.  Past medical history, social, surgical and family history all reviewed in electronic medical record.  No pertanent information unless stated regarding to the chief complaint.   Past Medical History:  Diagnosis Date   Arthritis    Cataract    Diverticulosis    Meniere's disease, bilateral    Osteopenia     Allergies  Allergen Reactions   Erythromycin Diarrhea   Clindamycin/Lincomycin Nausea And Vomiting   Morphine And Related Itching   Latex Rash     Review of Systems:  No headache, visual changes, nausea, vomiting, diarrhea, constipation, dizziness, abdominal pain, skin rash, fevers, chills, night sweats, weight loss, swollen lymph nodes, body aches, joint swelling, chest pain, shortness of breath, mood changes. POSITIVE muscle aches  Objective  There were no vitals taken for this visit.   General: No apparent distress alert and oriented x3 mood and affect normal, dressed appropriately.  HEENT: Pupils equal, extraocular movements intact  Respiratory: Patient's speak in full sentences and does not appear short of breath  Cardiovascular: No lower extremity edema, non tender, no erythema  Gait  MSK:  Back   Osteopathic findings  C2 flexed rotated and side bent right C6 flexed rotated and side bent  left T3 extended rotated and side bent right inhaled rib T9 extended rotated and side bent left L2 flexed rotated and side bent right Sacrum right on right     Assessment and Plan:  No problem-specific Assessment & Plan notes found for this encounter.    Nonallopathic problems  Decision today to treat with OMT was based on Physical Exam  After verbal consent patient was treated with HVLA, ME, FPR techniques in cervical, rib, thoracic, lumbar, and sacral  areas  Patient tolerated the procedure well with improvement in symptoms  Patient given exercises, stretches and lifestyle modifications  See medications in patient instructions if given  Patient will follow up in 4-8 weeks    The above documentation has been reviewed and is accurate and complete Lyndal Pulley, DO          Note: This dictation was prepared with Dragon dictation along with smaller phrase technology. Any transcriptional errors that result from this process are unintentional.

## 2021-10-23 ENCOUNTER — Ambulatory Visit: Payer: Medicare Other | Admitting: Family Medicine

## 2021-10-29 ENCOUNTER — Other Ambulatory Visit: Payer: Self-pay | Admitting: Family Medicine

## 2021-10-29 DIAGNOSIS — Z1231 Encounter for screening mammogram for malignant neoplasm of breast: Secondary | ICD-10-CM

## 2021-11-05 ENCOUNTER — Encounter: Payer: Self-pay | Admitting: *Deleted

## 2021-11-16 NOTE — Progress Notes (Signed)
[place] Phone: [phone] Subjective:   [name], am serving as a scribe for Dr. Hulan Saas.  I'm seeing this patient by the request  of:  Vivi Barrack, MD  CC: Bilateral knee, foot pain and back pain follow-up  [id number]  09/11/2021 Low back does have loss of lordosis  Discussed HEP  Discussed which activities to do.  See me again in 4-6 weeks   Patient is now greater than 3 months out from the metatarsal bone fracture.  Seems to be delayed union.  At this point I do feel that a bone stimulator could be beneficial.  We will see if we can get this approved.  Follow-up again in 6 weeks otherwise  Worsening pain and did not respond as well to the viscosupplementation.  Still wants to avoid though any type of surgical intervention for this chronic problem with exacerbation.  Discussed icing regimen, topical anti-inflammatories, continue with once weekly vitamin D can be helpful.  Follow-up with me again in 6 to 8 weeks.  Update 11/19/2021 BECKY COLAN is a 71 y.o. female coming in with complaint of B knee, L ankle and OMT. Injected both knees last visit. Patient referred for bone stimulator. Patient states that her knee and foot pain is worse than the back pain. No change in knee or foot pain since last visit. Injections have been helpful in alleviating her knee pain. Using NSAIDs regularly due to having to push this appt out. Wants to know if she should be using gabapentin instead of NSAIDs.   Feel since we last saw her and now having R shoulder pain especially with abduction.      Past Medical History:  Diagnosis Date   Arthritis    Cataract    Diverticulosis    Meniere's disease, bilateral    Osteopenia    Past Surgical History:  Procedure Laterality Date   ABDOMINAL HYSTERECTOMY     CHOLECYSTECTOMY     EYE SURGERY     age 89   THYROIDECTOMY, PARTIAL     Social History    Socioeconomic History   Marital status: Single    Spouse name: Not on file   Number of children: Not on file   Years of education: Not on file   Highest education level: Not on file  Occupational History   Occupation: Marine scientist  Tobacco Use   Smoking status: Every Day    Packs/day: 1.00    Types: Cigarettes   Smokeless tobacco: Never  Vaping Use   Vaping Use: Never used  Substance and Sexual Activity   Alcohol use: Yes    Alcohol/week: 21.0 standard drinks of alcohol    Types: 21 Glasses of wine per week    Comment: 3 glasses of wine per day   Drug use: Never   Sexual activity: Not Currently  Other Topics Concern   Not on file  Social History Narrative   Not on file   Social Determinants of Health   Financial Resource Strain: Not on file  Food Insecurity: Not on file  Transportation Needs: Not on file  Physical Activity: Not on file  Stress: Not on file  Social Connections: Not on file   Allergies  Allergen Reactions   Erythromycin Diarrhea   Clindamycin/Lincomycin Nausea And Vomiting   Morphine And Related Itching   Latex Rash   Family History  Problem Relation Age of Onset   Colon polyps  Mother    Heart disease Mother    Heart disease Father    Stomach cancer Paternal Grandfather      Current Outpatient Medications (Cardiovascular):    rosuvastatin (CRESTOR) 5 MG tablet, Take 1 tablet (5 mg total) by mouth daily.  Current Outpatient Medications (Respiratory):    cetirizine (ZYRTEC) 10 MG tablet, Take 10 mg by mouth daily.    Current Outpatient Medications (Other):    Calcium Carbonate-Vit D-Min (CALCIUM 1200 PO), Take by mouth daily.   gabapentin (NEURONTIN) 100 MG capsule, Take 2 capsules (200 mg total) by mouth at bedtime.   gentamicin cream (GARAMYCIN) 0.1 %, Apply 1 application topically 2 (two) times daily.   meclizine (ANTIVERT) 25 MG tablet, Take 1 tablet (25 mg total) by mouth 3 (three) times daily as needed for dizziness.   Multiple  Vitamins-Minerals (HAIR SKIN AND NAILS FORMULA PO), Take by mouth.   Multiple Vitamins-Minerals (WOMENS MULTIVITAMIN PO), Take by mouth.   nystatin cream (MYCOSTATIN), Apply 1 application topically 2 (two) times daily.   triamcinolone cream (KENALOG) 0.1 %, Apply 1 application topically 2 (two) times daily.   Vitamin D, Ergocalciferol, (DRISDOL) 1.25 MG (50000 UNIT) CAPS capsule, TAKE ONE CAPSULE BY MOUTH ONCE WEEKLY   vitamin E 200 UNIT capsule, Take 200 Units by mouth daily.   Reviewed prior external information including notes and imaging from  primary care provider As well as notes that were available from care everywhere and other healthcare systems.  Past medical history, social, surgical and family history all reviewed in electronic medical record.  No pertanent information unless stated regarding to the chief complaint.   Review of Systems:  No headache, visual changes, nausea, vomiting, diarrhea, constipation, dizziness, abdominal pain, skin rash, fevers, chills, night sweats, weight loss, swollen lymph nodes, body aches, joint swelling, chest pain, shortness of breath, mood changes. POSITIVE muscle aches  Objective  Blood pressure 126/84, pulse 71, height '5\' 8"'$  (1.727 m), weight 200 lb (90.7 kg), SpO2 97 %.   General: No apparent distress alert and oriented x3 mood and affect normal, dressed appropriately.  HEENT: Pupils equal, extraocular movements intact  Respiratory: Patient's speak in full sentences and does not appear short of breath  Cardiovascular: No lower extremity edema, non tender, no erythema  Back exam does have some loss of lordosis.  Patient does have limited range of motion in all planes.  Patient does have relatively good strength.  Pulses appear to be relatively unremarkable.  Still having tenderness to palpation over the teeth that have pes planus.  Limited muscular skeletal ultrasound was performed and interpreted by Hulan Saas, M  Limited ultrasound shows  the patient is having reaccumulation of the Baker's cyst on the right side.  Arthritic changes noted with moderate narrowing of the patellofemoral and the medial joint space Impression: Baker's cyst   Osteopathic findings T9 extended rotated and side bent left L2 flexed rotated and side bent right Sacrum right on right    Impression and Recommendations:    The above documentation has been reviewed and is accurate and complete Lyndal Pulley, DO

## 2021-11-19 ENCOUNTER — Ambulatory Visit (INDEPENDENT_AMBULATORY_CARE_PROVIDER_SITE_OTHER): Payer: Medicare Other | Admitting: Family Medicine

## 2021-11-19 ENCOUNTER — Ambulatory Visit: Payer: Medicare Other

## 2021-11-19 ENCOUNTER — Ambulatory Visit: Payer: Self-pay

## 2021-11-19 VITALS — BP 126/84 | HR 71 | Ht 68.0 in | Wt 200.0 lb

## 2021-11-19 DIAGNOSIS — M170 Bilateral primary osteoarthritis of knee: Secondary | ICD-10-CM

## 2021-11-19 DIAGNOSIS — G8929 Other chronic pain: Secondary | ICD-10-CM

## 2021-11-19 DIAGNOSIS — M999 Biomechanical lesion, unspecified: Secondary | ICD-10-CM

## 2021-11-19 DIAGNOSIS — M25561 Pain in right knee: Secondary | ICD-10-CM

## 2021-11-19 DIAGNOSIS — M25562 Pain in left knee: Secondary | ICD-10-CM

## 2021-11-19 DIAGNOSIS — M9902 Segmental and somatic dysfunction of thoracic region: Secondary | ICD-10-CM | POA: Diagnosis not present

## 2021-11-19 DIAGNOSIS — M9903 Segmental and somatic dysfunction of lumbar region: Secondary | ICD-10-CM

## 2021-11-19 DIAGNOSIS — M545 Low back pain, unspecified: Secondary | ICD-10-CM

## 2021-11-19 DIAGNOSIS — M25572 Pain in left ankle and joints of left foot: Secondary | ICD-10-CM

## 2021-11-19 DIAGNOSIS — M9904 Segmental and somatic dysfunction of sacral region: Secondary | ICD-10-CM | POA: Diagnosis not present

## 2021-11-19 DIAGNOSIS — M7121 Synovial cyst of popliteal space [Baker], right knee: Secondary | ICD-10-CM

## 2021-11-19 MED ORDER — GABAPENTIN 100 MG PO CAPS: 200.0000 mg | ORAL_CAPSULE | Freq: Every day | ORAL | 0 refills | Status: DC

## 2021-11-19 NOTE — Assessment & Plan Note (Signed)
Discussed with patient with patient.  We discussed potentially aspirating the Baker's cyst again.  Patient declined at the moment.  We will continue to monitor.  Wants to hold on the steroid injection but will consider the possibility of viscosupplementation again.  Follow-up again in 6 to 8 weeks otherwise.

## 2021-11-19 NOTE — Assessment & Plan Note (Signed)
Known spinal stenosis.  Does seem to be responding relatively well to the osteopathic manipulation but does have chronic problems with exacerbation.  Refilled gabapentin at 200 mg at night.  Discussed the prescription.  Follow-up again in 6 to 8 weeks

## 2021-11-19 NOTE — Assessment & Plan Note (Signed)

## 2021-11-19 NOTE — Patient Instructions (Signed)
Gabapentin '200mg'$  at night Xray See me again in 6-8 weeks

## 2021-12-18 ENCOUNTER — Other Ambulatory Visit: Payer: Self-pay | Admitting: Family Medicine

## 2021-12-18 ENCOUNTER — Ambulatory Visit
Admission: RE | Admit: 2021-12-18 | Discharge: 2021-12-18 | Disposition: A | Discharge status: Home / Self Care | Payer: Medicare Other | Source: Ambulatory Visit | Attending: Family Medicine | Admitting: Family Medicine

## 2021-12-18 ENCOUNTER — Encounter: Payer: Self-pay | Admitting: Family Medicine

## 2021-12-18 ENCOUNTER — Ambulatory Visit (INDEPENDENT_AMBULATORY_CARE_PROVIDER_SITE_OTHER): Payer: Medicare Other | Admitting: Family Medicine

## 2021-12-18 VITALS — BP 134/75 | HR 68 | Temp 97.5°F | Ht 68.0 in | Wt 200.6 lb

## 2021-12-18 DIAGNOSIS — F17200 Nicotine dependence, unspecified, uncomplicated: Secondary | ICD-10-CM | POA: Diagnosis not present

## 2021-12-18 DIAGNOSIS — Z0000 Encounter for general adult medical examination without abnormal findings: Secondary | ICD-10-CM

## 2021-12-18 DIAGNOSIS — R739 Hyperglycemia, unspecified: Secondary | ICD-10-CM

## 2021-12-18 DIAGNOSIS — Z1231 Encounter for screening mammogram for malignant neoplasm of breast: Secondary | ICD-10-CM

## 2021-12-18 DIAGNOSIS — Z1159 Encounter for screening for other viral diseases: Secondary | ICD-10-CM

## 2021-12-18 DIAGNOSIS — E559 Vitamin D deficiency, unspecified: Secondary | ICD-10-CM

## 2021-12-18 DIAGNOSIS — E782 Mixed hyperlipidemia: Secondary | ICD-10-CM

## 2021-12-18 DIAGNOSIS — R600 Localized edema: Secondary | ICD-10-CM | POA: Insufficient documentation

## 2021-12-18 DIAGNOSIS — E538 Deficiency of other specified B group vitamins: Secondary | ICD-10-CM | POA: Diagnosis not present

## 2021-12-18 DIAGNOSIS — Z789 Other specified health status: Secondary | ICD-10-CM

## 2021-12-18 LAB — LIPID PANEL
Cholesterol: 201 mg/dL — ABNORMAL HIGH (ref 0–200)
HDL: 62.5 mg/dL (ref >39.00)
LDL Cholesterol: 103 mg/dL — ABNORMAL HIGH (ref 0–99)
NonHDL: 138.79
Total CHOL/HDL Ratio: 3
Triglycerides: 177 mg/dL — ABNORMAL HIGH (ref 0.0–149.0)
VLDL: 35.4 mg/dL (ref 0.0–40.0)

## 2021-12-18 LAB — CBC
HCT: 40.9 % (ref 36.0–46.0)
Hemoglobin: 13 g/dL (ref 12.0–15.0)
MCHC: 31.7 g/dL (ref 30.0–36.0)
MCV: 79.9 fl (ref 78.0–100.0)
Platelets: 218 10*3/uL (ref 150.0–400.0)
RBC: 5.11 Mil/uL (ref 3.87–5.11)
RDW: 14.5 % (ref 11.5–15.5)
WBC: 6 10*3/uL (ref 4.0–10.5)

## 2021-12-18 LAB — VITAMIN D 25 HYDROXY (VIT D DEFICIENCY, FRACTURES): VITD: 59.5 ng/mL (ref 30.00–100.00)

## 2021-12-18 LAB — COMPREHENSIVE METABOLIC PANEL
ALT: 21 U/L (ref 0–35)
AST: 20 U/L (ref 0–37)
Albumin: 4.3 g/dL (ref 3.5–5.2)
Alkaline Phosphatase: 100 U/L (ref 39–117)
BUN: 17 mg/dL (ref 6–23)
CO2: 29 mEq/L (ref 19–32)
Calcium: 9.3 mg/dL (ref 8.4–10.5)
Chloride: 102 mEq/L (ref 96–112)
Creatinine, Ser: 0.86 mg/dL (ref 0.40–1.20)
GFR: 67.42 mL/min (ref >60.00)
Glucose, Bld: 95 mg/dL (ref 70–99)
Potassium: 4.1 mEq/L (ref 3.5–5.1)
Sodium: 139 mEq/L (ref 135–145)
Total Bilirubin: 0.6 mg/dL (ref 0.2–1.2)
Total Protein: 6.6 g/dL (ref 6.0–8.3)

## 2021-12-18 LAB — T3, FREE: T3, Free: 3.4 pg/mL (ref 2.3–4.2)

## 2021-12-18 LAB — T4, FREE: Free T4: 0.93 ng/dL (ref 0.60–1.60)

## 2021-12-18 LAB — VITAMIN B12: Vitamin B-12: 483 pg/mL (ref 211–911)

## 2021-12-18 LAB — HEMOGLOBIN A1C: Hgb A1c MFr Bld: 5.6 % (ref 4.6–6.5)

## 2021-12-18 LAB — TSH: TSH: 0.66 u[IU]/mL (ref 0.35–5.50)

## 2021-12-18 MED ORDER — NICOTINE POLACRILEX 2 MG MT LOZG: 2.0000 mg | LOZENGE | OROMUCOSAL | 0 refills | Status: DC | PRN

## 2021-12-18 NOTE — Patient Instructions (Signed)
It was very nice to see you today!  We will check blood work today.  I am glad that you are cutting down on smoking.  I will send a prescription in for the nicotine lozenges.  Please keep up the great work with this.  We will see you back in year for your next physical.  Please come back to see [place] sooner if needed.  Take care, Dr [name]  PLEASE NOTE:  If you had any lab tests please let us know if you have not heard back within a few days. You may see your results on mychart before we have a chance to review them but we will give you a call once they are reviewed by [place]. If we ordered any referrals today, please let us know if you have not heard from their office within the next week.   Please try these tips to maintain a healthy lifestyle:  Eat at least 3 REAL meals and 1-2 snacks per day.  Aim for no more than 5 hours between eating.  If you eat breakfast, please do so within one hour of getting up.   Each meal should contain half fruits/vegetables, one quarter protein, and one quarter carbs (no bigger than a computer mouse)  Cut down on sweet beverages. This includes juice, soda, and sweet tea.   Drink at least 1 glass of water with each meal and aim for at least 8 glasses per day  Exercise at least 150 minutes every week.    Preventive Care 90 Years and Older, Female Preventive care refers to lifestyle choices and visits with your health care provider that can promote health and wellness. Preventive care visits are also called wellness exams. What can I expect for my preventive care visit? Counseling Your health care provider may ask you questions about your: Medical history, including: Past medical problems. Family medical history. Pregnancy and menstrual history. History of falls. Current health, including: Memory and ability to understand (cognition). Emotional well-being. Home life and relationship well-being. Sexual activity and sexual health. Lifestyle,  including: Alcohol, nicotine or tobacco, and drug use. Access to firearms. Diet, exercise, and sleep habits. Work and work Statistician. Sunscreen use. Safety issues such as seatbelt and bike helmet use. Physical exam Your health care provider will check your: Height and weight. These may be used to calculate your BMI (body mass index). BMI is a measurement that tells if you are at a healthy weight. Waist circumference. This measures the distance around your waistline. This measurement also tells if you are at a healthy weight and may help predict your risk of certain diseases, such as type 2 diabetes and high blood pressure. Heart rate and blood pressure. Body temperature. Skin for abnormal spots. What immunizations do I need?  Vaccines are usually given at various ages, according to a schedule. Your health care provider will recommend vaccines for you based on your age, medical history, and lifestyle or other factors, such as travel or where you work. What tests do I need? Screening Your health care provider may recommend screening tests for certain conditions. This may include: Lipid and cholesterol levels. Hepatitis C test. Hepatitis B test. HIV (human immunodeficiency virus) test. STI (sexually transmitted infection) testing, if you are at risk. Lung cancer screening. Colorectal cancer screening. Diabetes screening. This is done by checking your blood sugar (glucose) after you have not eaten for a while (fasting). Mammogram. Talk with your health care provider about how often you should have regular mammograms. BRCA-related  cancer screening. This may be done if you have a family history of breast, ovarian, tubal, or peritoneal cancers. Bone density scan. This is done to screen for osteoporosis. Talk with your health care provider about your test results, treatment options, and if necessary, the need for more tests. Follow these instructions at home: Eating and drinking  Eat a  diet that includes fresh fruits and vegetables, whole grains, lean protein, and low-fat dairy products. Limit your intake of foods with high amounts of sugar, saturated fats, and salt. Take vitamin and mineral supplements as recommended by your health care provider. Do not drink alcohol if your health care provider tells you not to drink. If you drink alcohol: Limit how much you have to 0-1 drink a day. Know how much alcohol is in your drink. In the [place] one drink equals one 12 oz bottle of beer (355 mL), one 5 oz glass of wine (148 mL), or one 1 oz glass of hard liquor (44 mL). Lifestyle Brush your teeth every morning and night with fluoride toothpaste. Floss one time each day. Exercise for at least 30 minutes 5 or more days each week. Do not use any products that contain nicotine or tobacco. These products include cigarettes, chewing tobacco, and vaping devices, such as e-cigarettes. If you need help quitting, ask your health care provider. Do not use drugs. If you are sexually active, practice safe sex. Use a condom or other form of protection in order to prevent STIs. Take aspirin only as told by your health care provider. Make sure that you understand how much to take and what form to take. Work with your health care provider to find out whether it is safe and beneficial for you to take aspirin daily. Ask your health care provider if you need to take a cholesterol-lowering medicine (statin). Find healthy ways to manage stress, such as: Meditation, yoga, or listening to music. Journaling. Talking to a trusted person. Spending time with friends and family. Minimize exposure to UV radiation to reduce your risk of skin cancer. Safety Always wear your seat belt while driving or riding in a vehicle. Do not drive: If you have been drinking alcohol. Do not ride with someone who has been drinking. When you are tired or distracted. While texting. If you have been using any mind-altering  substances or drugs. Wear a helmet and other protective equipment during sports activities. If you have firearms in your house, make sure you follow all gun safety procedures. What's next? Visit your health care provider once a year for an annual wellness visit. Ask your health care provider how often you should have your eyes and teeth checked. Stay up to date on all vaccines. This information is not intended to replace advice given to you by your health care provider. Make sure you discuss any questions you have with your health care provider. Document Revised: 07/26/2020 Document Reviewed: 07/26/2020 Elsevier Patient Education  [place].

## 2021-12-18 NOTE — Assessment & Plan Note (Signed)
No red flags.  Likely due to mild venous insufficiency.  Recommended leg elevation and salt avoidance.

## 2021-12-18 NOTE — Assessment & Plan Note (Signed)
Encouraged cessation.

## 2021-12-18 NOTE — Assessment & Plan Note (Signed)
Patient was asked about her tobacco use today and was strongly advised to quit. Patient is currently cutting down.  She is down to 2 cigarettes daily.. We reviewed treatment options to assist her quit smoking including NRT, Chantix, and Bupropion.  We will start nicotine lozenges.  Follow up at next office visit.   Total time spent counseling approximately 3 minutes.

## 2021-12-18 NOTE — Assessment & Plan Note (Signed)
Check B12 

## 2021-12-18 NOTE — Assessment & Plan Note (Signed)
On Crestor 5 mg daily.  Tolerating well.  Check lipids.

## 2021-12-18 NOTE — Progress Notes (Signed)
Chief Complaint:  Kimberly [name] is a 71 y.o. female who presents today for a subsequent Medicare Annual Wellness Visit and to discuss management of her chronic medical problems.  Assessment/Plan:  Chronic Problems Addressed Today: B12 deficiency Check B12.   Hyperlipidemia, mixed On Crestor 5 mg daily.  Tolerating well.  Check lipids.  Leg edema No red flags.  Likely due to mild venous insufficiency.  Recommended leg elevation and salt avoidance.  Nicotine dependence with current use Patient was asked about her tobacco use today and was strongly advised to quit. Patient is currently cutting down.  She is down to 2 cigarettes daily.. We reviewed treatment options to assist her quit smoking including NRT, Chantix, and Bupropion.  We will start nicotine lozenges.  Follow up at next office visit.   Total time spent counseling approximately 3 minutes.     Alcohol use Encouraged cessation.   Preventative Healthcare Health Maintenance Due  Topic Date Due   Medicare Annual Wellness (AWV)  08/06/2017   MAMMOGRAM  12/12/2021  She will be getting her bone density scan next year.  She will be getting her mammogram done today.  She is up-to-date on colon cancer screening.  She is up-to-date on vaccines.   During the course of the visit the patient was educated and counseled about appropriate screening and preventive services including:        Fall prevention   Nutrition Physical Activity Weight Management Cognition    Subjective:  HPI:  Health Risk Assessment: Patient considers her overall health to be good. He has no difficulty performing the following: Preparing food and eating Bathing  Getting dressed Using the toilet Shopping Managing Finances Moving around from place to place  She did have a mechanical fall a few months ago. She had mild abrasian and burise but this has healed normally.      12/18/2021    9:58 AM  Depression screen PHQ 2/9  Decreased Interest 0   Down, Depressed, Hopeless 0  PHQ - 2 Score 0   Lifestyle Factors: Diet: None specific.  Exercise: None specific.   Patient Care Team: Vivi Barrack, MD as PCP - General (Family Medicine)   She has no acute complaints today.   ROS: Per HPI, otherwise a complete review of systems was negative.   PMH:  The following were reviewed and entered/updated in epic: Past Medical History:  Diagnosis Date   Arthritis    Cataract    Diverticulosis    Meniere's disease, bilateral    Osteopenia    Patient Active Problem List   Diagnosis Date Noted   Leg edema 12/18/2021   Alcohol use 12/18/2021   Metatarsal bone fracture 03/27/2021   Pes planus 02/13/2021   B12 deficiency 02/13/2021   Nicotine dependence with current use 12/11/2020   Degenerative arthritis of knee, bilateral 12/15/2019   Degenerative joint disease of knee, right 11/29/2019   Baker's cyst of knee, right 09/28/2019   Hyperlipidemia, mixed 09/01/2019   Nonallopathic lesion of lumbar region 08/10/2019   Nonallopathic lesion of thoracic region 08/10/2019   Nonallopathic lesion of sacral region 08/10/2019   Partial tear of right hamstring 07/13/2019   Meniere's disease of both ears 10/05/2018   Osteopenia 10/05/2018   Low back pain potentially associated with spinal stenosis 10/05/2018   Past Surgical History:  Procedure Laterality Date   ABDOMINAL HYSTERECTOMY     CHOLECYSTECTOMY     EYE SURGERY     age 46   THYROIDECTOMY,  PARTIAL      Family History  Problem Relation Age of Onset   Colon polyps Mother    Heart disease Mother    Heart disease Father    Stomach cancer Paternal Grandfather     Medications- reviewed and updated Current Outpatient Medications  Medication Sig Dispense Refill   Calcium Carbonate-Vit D-Min (CALCIUM 1200 PO) Take by mouth daily.     cetirizine (ZYRTEC) 10 MG tablet Take 10 mg by mouth daily.     gabapentin (NEURONTIN) 100 MG capsule Take 2 capsules (200 mg total) by mouth at  bedtime. 180 capsule 0   gentamicin cream (GARAMYCIN) 0.1 % Apply 1 application topically 2 (two) times daily. 30 g 1   meclizine (ANTIVERT) 25 MG tablet Take 1 tablet (25 mg total) by mouth 3 (three) times daily as needed for dizziness. 60 tablet 0   Multiple Vitamins-Minerals (HAIR SKIN AND NAILS FORMULA PO) Take by mouth.     Multiple Vitamins-Minerals (WOMENS MULTIVITAMIN PO) Take by mouth.     nicotine polacrilex (NICOTINE MINI) 2 MG lozenge Take 1 lozenge (2 mg total) by mouth as needed for smoking cessation. 100 tablet 0   nystatin cream (MYCOSTATIN) Apply 1 application topically 2 (two) times daily. 30 g 0   rosuvastatin (CRESTOR) 5 MG tablet TAKE ONE TABLET BY MOUTH DAILY 90 tablet 3   triamcinolone cream (KENALOG) 0.1 % Apply 1 application topically 2 (two) times daily. 30 g 0   Vitamin D, Ergocalciferol, (DRISDOL) 1.25 MG (50000 UNIT) CAPS capsule TAKE ONE CAPSULE BY MOUTH ONCE WEEKLY 4 capsule 0   vitamin E 200 UNIT capsule Take 200 Units by mouth daily.     No current facility-administered medications for this visit.    Allergies-reviewed and updated Allergies  Allergen Reactions   Erythromycin Diarrhea   Clindamycin/Lincomycin Nausea And Vomiting   Morphine And Related Itching   Latex Rash    Social History   Socioeconomic History   Marital status: Single    Spouse name: Not on file   Number of children: Not on file   Years of education: Not on file   Highest education level: Not on file  Occupational History   Occupation: Marine scientist  Tobacco Use   Smoking status: Every Day    Packs/day: 1.00    Types: Cigarettes   Smokeless tobacco: Never  Vaping Use   Vaping Use: Never used  Substance and Sexual Activity   Alcohol use: Yes    Alcohol/week: 21.0 standard drinks of alcohol    Types: 21 Glasses of wine per week    Comment: 3 glasses of wine per day   Drug use: Never   Sexual activity: Not Currently  Other Topics Concern   Not on file  Social History  Narrative   Not on file   Social Determinants of Health   Financial Resource Strain: Not on file  Food Insecurity: Not on file  Transportation Needs: Not on file  Physical Activity: Not on file  Stress: Not on file  Social Connections: Not on file         Objective/Observations  Physical Exam: BP 134/75   Pulse 68   Temp (!) 97.5 F (36.4 C) (Temporal)   Ht '5\' 8"'$  (1.727 m)   Wt 200 lb 9.6 oz (91 kg)   SpO2 96%   BMI 30.50 kg/m  Wt Readings from Last 3 Encounters:  12/18/21 200 lb 9.6 oz (91 kg)  11/19/21 200 lb (90.7 kg)  09/11/21  199 lb (90.3 kg)   Gen: NAD, resting comfortably HEENT: TMs normal bilaterally. OP clear. No thyromegaly noted.  CV: RRR with no murmurs appreciated Pulm: NWOB, CTAB with no crackles, wheezes, or rhonchi GI: Normal bowel sounds present. Soft, Nontender, Nondistended. MSK: no edema, cyanosis, or clubbing noted Skin: warm, dry Neuro: CN2-12 grossly intact. Strength 5/5 in upper and lower extremities. Reflexes symmetric and intact bilaterally. Normal minicog with 3/3 delayed word recall. Psych: Normal affect and thought content      Viyan Rosamond M. [name], MD 12/18/2021 10:41 AM

## 2021-12-19 LAB — HEPATITIS C ANTIBODY: Hepatitis C Ab: NONREACTIVE

## 2021-12-20 NOTE — Progress Notes (Signed)
Please inform patient of the following:  Cholesterol a little elevated compared to last year but overall stable.  Everything else is normal.  Do not need to make any changes to treatment plan at this time.  We can continue current dose of medications.  We should recheck in a year.

## 2021-12-27 NOTE — Progress Notes (Signed)
[facility] [place] Phone: [phone] Subjective:    I'm seeing this patient by the request  of:  Vivi Barrack, MD  CC: Follow-up for multiple complaints  BJS:[id number]  Kimberly [name] is a 71 y.o. female coming in with complaint of back and neck pain. OMT 11/19/2021. Also f/u for B ankle and B knee pain. History of baker's cyst R knee.  Patient at last exam did have an ultrasound showing patient did have a Baker's cyst noted.  Patient states that she is experiencing swelling in back of knees. Less pain though in lower leg. Swelling also occurring in both feet.   Back and neck pain have been manageable since last visit.   Medications patient has been prescribed: Gabapentin  Taking:         Reviewed prior external information including notes and imaging from previsou exam, outside providers and external EMR if available.   As well as notes that were available from care everywhere and other healthcare systems.  Past medical history, social, surgical and family history all reviewed in electronic medical record.  No pertanent information unless stated regarding to the chief complaint.   Past Medical History:  Diagnosis Date   Arthritis    Cataract    Diverticulosis    Meniere's disease, bilateral    Osteopenia     Allergies  Allergen Reactions   Erythromycin Diarrhea   Clindamycin/Lincomycin Nausea And Vomiting   Morphine And Related Itching   Latex Rash     Review of Systems:  No headache, visual changes, nausea, vomiting, diarrhea, constipation, dizziness, abdominal pain, skin rash, fevers, chills, night sweats, weight loss, swollen lymph nodes, body aches, joint swelling, chest pain, shortness of breath, mood changes. POSITIVE muscle aches  Objective  Blood pressure 116/74, pulse 87, height '5\' 8"'$  (1.727 m), weight 201 lb (91.2 kg), SpO2 98 %.   General: No apparent distress alert and oriented x3 mood  and affect normal, dressed appropriately.  HEENT: Pupils equal, extraocular movements intact  Respiratory: Patient's speak in full sentences and does not appear short of breath  Cardiovascular: No lower extremity edema, non tender, no erythema  Knee exam shows arthritic changes with very minimal tenderness noted in the medial compartment. Low back exam does have some mild loss of lordosis.  Some tightness in the paraspinal musculature noted as well.  Patient does have some tightness with FABER test right greater than left.  Osteopathic findings  C2 flexed rotated and side bent right C6 flexed rotated and side bent left T3 extended rotated and side bent right inhaled rib T8 extended rotated and side bent left L2 flexed rotated and side bent right Sacrum right on right        Assessment and Plan:  Degenerative arthritis of knee, bilateral Patient's knees do have moderate arthritis.  These were independently visualized on the arthritic changes on the x-ray today.  We discussed with patient about icing regimen and home exercises, we discussed that we can consider other injections such as viscosupplementation.  With patient not hurting as much so we will hold at this time.  Follow-up with me again in 6 to 8 weeks.  Low back pain potentially associated with spinal stenosis Continues to do relatively well with conservative therapy at this moment.  Seems to respond well to osteopathic manipulation when necessary.  Discussed over-the-counter medications again.  We will follow-up again in 6 to 8 weeks    Nonallopathic  problems  Decision today to treat with OMT was based on Physical Exam  After verbal consent patient was treated with HVLA, ME, FPR techniques in cervical, rib, thoracic, lumbar, and sacral  areas  Patient tolerated the procedure well with improvement in symptoms  Patient given exercises, stretches and lifestyle modifications  See medications in patient instructions if  given  Patient will follow up in 4-8 weeks     The above documentation has been reviewed and is accurate and complete Kimberly Pulley, DO         Note: This dictation was prepared with Dragon dictation along with smaller phrase technology. Any transcriptional errors that result from this process are unintentional.

## 2021-12-31 ENCOUNTER — Ambulatory Visit (INDEPENDENT_AMBULATORY_CARE_PROVIDER_SITE_OTHER): Payer: Medicare Other | Admitting: Family Medicine

## 2021-12-31 ENCOUNTER — Ambulatory Visit: Payer: Medicare Other

## 2021-12-31 ENCOUNTER — Ambulatory Visit (INDEPENDENT_AMBULATORY_CARE_PROVIDER_SITE_OTHER): Payer: Medicare Other

## 2021-12-31 VITALS — BP 116/74 | HR 87 | Ht 68.0 in | Wt 201.0 lb

## 2021-12-31 DIAGNOSIS — M9903 Segmental and somatic dysfunction of lumbar region: Secondary | ICD-10-CM

## 2021-12-31 DIAGNOSIS — M25562 Pain in left knee: Secondary | ICD-10-CM

## 2021-12-31 DIAGNOSIS — M25561 Pain in right knee: Secondary | ICD-10-CM

## 2021-12-31 DIAGNOSIS — M170 Bilateral primary osteoarthritis of knee: Secondary | ICD-10-CM

## 2021-12-31 DIAGNOSIS — M9904 Segmental and somatic dysfunction of sacral region: Secondary | ICD-10-CM

## 2021-12-31 DIAGNOSIS — M9902 Segmental and somatic dysfunction of thoracic region: Secondary | ICD-10-CM

## 2021-12-31 DIAGNOSIS — M545 Low back pain, unspecified: Secondary | ICD-10-CM

## 2021-12-31 DIAGNOSIS — G8929 Other chronic pain: Secondary | ICD-10-CM

## 2021-12-31 NOTE — Assessment & Plan Note (Signed)
Patient's knees do have moderate arthritis.  These were independently visualized on the arthritic changes on the x-ray today.  We discussed with patient about icing regimen and home exercises, we discussed that we can consider other injections such as viscosupplementation.  With patient not hurting as much so we will hold at this time.  Follow-up with me again in 6 to 8 weeks.

## 2021-12-31 NOTE — Assessment & Plan Note (Signed)
Continues to do relatively well with conservative therapy at this moment.  Seems to respond well to osteopathic manipulation when necessary.  Discussed over-the-counter medications again.  We will follow-up again in 6 to 8 weeks

## 2021-12-31 NOTE — Patient Instructions (Signed)
Great to see you  Happy Holidays See me in 2 months

## 2022-02-22 ENCOUNTER — Encounter: Payer: Self-pay | Admitting: Family Medicine

## 2022-02-25 NOTE — Progress Notes (Signed)
[facility] [place] Phone: [phone] Subjective:   [name], am serving as a scribe for Dr. Hulan Saas.  I'm seeing this patient by the request  of:  Vivi Barrack, MD  CC: Bilateral foot pain, back pain knee pain  LHT:[id number]  ALYHA MARINES is a 72 y.o. female coming in with complaint of back and neck pain. OMT 12/31/2021. Also f/u for B foot and B knee pain. Patient states that her pain is more intense and more frequent. Pain is throughout the foot. Feels likes toes are going to break off her foot. R>L. No new injury, no changes in footwear, or has not been on feet more frequently.   Back pain has been manageable.    Medications patient has been prescribed:   Taking:        Reviewed prior external information including notes and imaging from previsou exam, outside providers and external EMR if available.   As well as notes that were available from care everywhere and other healthcare systems.  Past medical history, social, surgical and family history all reviewed in electronic medical record.  No pertanent information unless stated regarding to the chief complaint.   Past Medical History:  Diagnosis Date   Arthritis    Cataract    Diverticulosis    Meniere's disease, bilateral    Osteopenia     Allergies  Allergen Reactions   Erythromycin Diarrhea   Clindamycin/Lincomycin Nausea And Vomiting   Morphine And Related Itching   Latex Rash     Review of Systems:  No headache, visual changes, nausea, vomiting, diarrhea, constipation, dizziness, abdominal pain, skin rash, fevers, chills, night sweats, weight loss, swollen lymph nodes, body aches, joint swelling, chest pain, shortness of breath, mood changes. POSITIVE muscle aches  Objective  Blood pressure 122/82, pulse 79, height '5\' 8"'$  (1.727 m), weight 206 lb (93.4 kg), SpO2 99 %.   General: No apparent distress alert and oriented x3 mood  and affect normal, dressed appropriately.  HEENT: Pupils equal, extraocular movements intact  Respiratory: Patient's speak in full sentences and does not appear short of breath  Bilateral foot exam shows that patient does have some pes planus laterally.  Diffusely tender overall.  Begin swelling but does have some mild 1+ pitting edema noted of the lower extremities possibly left greater than right.  Knee exams do have arthritic changes but no significant loss bleeding noted in the popliteal area.  Osteopathic findings  C2 flexed rotated and side bent right C7 flexed rotated and side bent left T3 extended rotated and side bent right inhaled rib T9 extended rotated and side bent left L2 flexed rotated and side bent right Sacrum right on right       Assessment and Plan:  Pes planus Continues to have foot pain bilaterally that is out of proportion.  No likely spinal stenosis of the lumbar spine.  Patient does have some pitting edema of the lower extremities that is also a concern at this point I do think nerve conduction studies and ABIs are necessary to help [place] see if patient does have any other underlying causes that could be contributing to her pain.  Patient will follow-up with me again in 3 to 6 weeks    Nonallopathic problems  Decision today to treat with OMT was based on Physical Exam  After verbal consent patient was treated with HVLA, ME, FPR techniques in cervical, rib, thoracic, lumbar, and sacral  areas  Patient tolerated the procedure well with improvement in symptoms  Patient given exercises, stretches and lifestyle modifications  See medications in patient instructions if given  Patient will follow up in 4-8 weeks     The above documentation has been reviewed and is accurate and complete Lyndal Pulley, DO         Note: This dictation was prepared with Dragon dictation along with smaller phrase technology. Any transcriptional errors that result from this  process are unintentional.

## 2022-02-28 ENCOUNTER — Ambulatory Visit (INDEPENDENT_AMBULATORY_CARE_PROVIDER_SITE_OTHER): Payer: Medicare Other

## 2022-02-28 ENCOUNTER — Ambulatory Visit: Payer: Self-pay

## 2022-02-28 ENCOUNTER — Ambulatory Visit (INDEPENDENT_AMBULATORY_CARE_PROVIDER_SITE_OTHER): Payer: Medicare Other | Admitting: Family Medicine

## 2022-02-28 VITALS — BP 122/82 | HR 79 | Ht 68.0 in | Wt 206.0 lb

## 2022-02-28 DIAGNOSIS — M2142 Flat foot [pes planus] (acquired), left foot: Secondary | ICD-10-CM

## 2022-02-28 DIAGNOSIS — M9903 Segmental and somatic dysfunction of lumbar region: Secondary | ICD-10-CM | POA: Diagnosis not present

## 2022-02-28 DIAGNOSIS — M9901 Segmental and somatic dysfunction of cervical region: Secondary | ICD-10-CM

## 2022-02-28 DIAGNOSIS — M2141 Flat foot [pes planus] (acquired), right foot: Secondary | ICD-10-CM | POA: Diagnosis not present

## 2022-02-28 DIAGNOSIS — M9908 Segmental and somatic dysfunction of rib cage: Secondary | ICD-10-CM | POA: Diagnosis not present

## 2022-02-28 DIAGNOSIS — M9904 Segmental and somatic dysfunction of sacral region: Secondary | ICD-10-CM | POA: Diagnosis not present

## 2022-02-28 DIAGNOSIS — M79672 Pain in left foot: Secondary | ICD-10-CM | POA: Diagnosis not present

## 2022-02-28 DIAGNOSIS — M79671 Pain in right foot: Secondary | ICD-10-CM

## 2022-02-28 DIAGNOSIS — M1711 Unilateral primary osteoarthritis, right knee: Secondary | ICD-10-CM

## 2022-02-28 DIAGNOSIS — M7121 Synovial cyst of popliteal space [Baker], right knee: Secondary | ICD-10-CM

## 2022-02-28 DIAGNOSIS — M170 Bilateral primary osteoarthritis of knee: Secondary | ICD-10-CM | POA: Diagnosis not present

## 2022-02-28 DIAGNOSIS — M545 Low back pain, unspecified: Secondary | ICD-10-CM

## 2022-02-28 DIAGNOSIS — M9902 Segmental and somatic dysfunction of thoracic region: Secondary | ICD-10-CM

## 2022-02-28 NOTE — Patient Instructions (Addendum)
[facility]Neurology will call you  [facility] will call you [place]  ([facility]) [address] [place], [place] [address] [phone]   Wear compression when not on the beach  See me in 6-8 weeks

## 2022-02-28 NOTE — Assessment & Plan Note (Addendum)
Chronic problem with worsening pain continues to have foot pain bilaterally that is out of proportion.  No likely spinal stenosis of the lumbar spine.  Patient does have some pitting edema of the lower extremities that is also a concern at this point I do think nerve conduction studies and ABIs are necessary to help [place] see if patient does have any other underlying causes that could be contributing to her pain.  Patient will follow-up with me again in 3 to 6 weeks

## 2022-02-28 NOTE — Assessment & Plan Note (Signed)
Continue to monitor and could be contributing to the bilateral foot pain.  Nerve conduction study ordered

## 2022-02-28 NOTE — Assessment & Plan Note (Signed)
Arthritic changes noted but doing relatively well.  Continue to monitor.

## 2022-02-28 NOTE — Assessment & Plan Note (Signed)
Continue to monitor no signs of reaccumulation at this time

## 2022-03-01 ENCOUNTER — Other Ambulatory Visit: Payer: Self-pay

## 2022-03-01 ENCOUNTER — Other Ambulatory Visit (HOSPITAL_COMMUNITY): Payer: Self-pay | Admitting: Family Medicine

## 2022-03-01 ENCOUNTER — Encounter: Payer: Self-pay | Admitting: Neurology

## 2022-03-01 DIAGNOSIS — R202 Paresthesia of skin: Secondary | ICD-10-CM

## 2022-03-01 DIAGNOSIS — I739 Peripheral vascular disease, unspecified: Secondary | ICD-10-CM

## 2022-03-07 ENCOUNTER — Inpatient Hospital Stay (HOSPITAL_COMMUNITY): Admission: RE | Admit: 2022-03-07 | Payer: Medicare Other | Source: Ambulatory Visit

## 2022-03-17 ENCOUNTER — Encounter: Payer: Self-pay | Admitting: Family Medicine

## 2022-03-18 NOTE — Telephone Encounter (Signed)
Pharmacy correct in pt chart

## 2022-03-19 ENCOUNTER — Ambulatory Visit (HOSPITAL_COMMUNITY)
Admission: RE | Admit: 2022-03-19 | Discharge: 2022-03-19 | Disposition: A | Discharge status: Home / Self Care | Payer: Medicare Other | Source: Ambulatory Visit | Attending: Internal Medicine | Admitting: Internal Medicine

## 2022-03-19 DIAGNOSIS — I739 Peripheral vascular disease, unspecified: Secondary | ICD-10-CM

## 2022-03-19 DIAGNOSIS — M79672 Pain in left foot: Secondary | ICD-10-CM | POA: Diagnosis present

## 2022-03-19 DIAGNOSIS — M79671 Pain in right foot: Secondary | ICD-10-CM | POA: Diagnosis present

## 2022-03-19 LAB — VAS US LOWER EXT ART SEG MULTI (SEGMENTALS & LE RAYNAUDS)
Left ABI: 1.3
Right ABI: 1.3

## 2022-03-21 ENCOUNTER — Encounter: Payer: Self-pay | Admitting: Family Medicine

## 2022-03-21 ENCOUNTER — Ambulatory Visit (INDEPENDENT_AMBULATORY_CARE_PROVIDER_SITE_OTHER): Payer: Medicare Other | Admitting: Neurology

## 2022-03-21 DIAGNOSIS — R202 Paresthesia of skin: Secondary | ICD-10-CM | POA: Diagnosis not present

## 2022-03-21 DIAGNOSIS — M5417 Radiculopathy, lumbosacral region: Secondary | ICD-10-CM

## 2022-03-21 NOTE — Procedures (Signed)
[facility]  [place]  [place], [place] [address] Tel: [phone] Fax: [phone] Test Date:  03/21/2022  Patient: Kimberly [name] DOB: 1950-07-14 Physician: Narda Amber, DO  Sex: Female Height: '5\' 8"'$  Ref Phys: Hulan Saas, DO  ID#: [id number]   Technician:    History: This is a 72 year old female referred for evaluation of bilateral feet pain.  NCV & EMG Findings: Extensive electrodiagnostic testing of the right lower extremity and additional studies of the left shows:  Bilateral sural and superficial peroneal sensory responses are within normal limits. Left peroneal motor response shows reduced amplitude (1.6 mV) at the extensor digitorum brevis, and is normal at the tibialis anterior.  Right peroneal and bilateral tibial motor responses are within normal limits. Bilateral tibial H reflex studies are within normal limits. Chronic motor axon loss changes are seen affecting the L5 myotome on the left, without accompanying active denervation.   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 There is no evidence of a large fiber sensorimotor polyneuropathy affecting the lower extremities.   ___________________________ Narda Amber, DO    Nerve Conduction Studies   Stim Site NR Peak (ms) Norm Peak (ms) O-P Amp (V) Norm O-P Amp  Left Sup Peroneal Anti Sensory (Ant Lat Mall)  33 C  12 cm    2.6 <4.6 5.5 >3  Right Sup Peroneal Anti Sensory (Ant Lat Mall)  33 C  12 cm    3.3 <4.6 7.7 >3  Left Sural Anti Sensory (Lat Mall)  33 C  Calf    3.8 <4.6 11.2 >3  Right Sural Anti Sensory (Lat Mall)  33 C  Calf    3.8 <4.6 9.6 >3     Stim Site NR Onset (ms) Norm Onset (ms) O-P Amp (mV) Norm O-P Amp Site1 Site2 Delta-0 (ms) Dist (cm) Vel (m/s) Norm Vel (m/s)  Left Peroneal Motor (Ext Dig Brev)  33 C  Ankle    4.7 <6.0 *1.6 >2.5 B Fib Ankle 8.5 41.0 48 >40  B Fib    13.2  1.6  Poplt B Fib 1.6 8.0 50 >40  Poplt    14.8   1.5         Right Peroneal Motor (Ext Dig Brev)  33 C  Ankle    3.0 <6.0 4.5 >2.5 B Fib Ankle 9.3 39.0 42 >40  B Fib    12.3  4.0  Poplt B Fib 1.7 8.0 47 >40  Poplt    14.0  3.9         Left Peroneal TA Motor (Tib Ant)  33 C  Fib Head    3.1 <4.5 5.6 >3 Poplit Fib Head 1.4 8.0 57 >40  Poplit    4.5 <5.7 5.6         Left Tibial Motor (Abd Hall Brev)  33 C  Ankle    4.1 <6.0 6.2 >4 Knee Ankle 9.6 42.0 44 >40  Knee    13.7  3.0         Right Tibial Motor (Abd Hall Brev)  33 C  Ankle    3.6 <6.0 6.0 >4 Knee Ankle 8.7 42.0 48 >40  Knee    12.3  5.1          Electromyography   Side Muscle Ins.Act Fibs Fasc Recrt Amp Dur Poly Activation Comment  Right AntTibialis Nml Nml Nml Nml Nml Nml Nml Nml N/A  Right Gastroc Nml Nml Nml Nml Nml Nml Nml  Nml N/A  Right Flex Dig Long Nml Nml Nml Nml Nml Nml Nml Nml N/A  Right RectFemoris Nml Nml Nml Nml Nml Nml Nml Nml N/A  Right BicepsFemS Nml Nml Nml Nml Nml Nml Nml Nml N/A  Left AntTibialis Nml Nml Nml *1- *1+ *1+ *1+ Nml N/A  Left Gastroc Nml Nml Nml Nml Nml Nml Nml Nml N/A  Left Flex Dig Long Nml Nml Nml *1- *1+ *1+ *1+ Nml N/A  Left RectFemoris Nml Nml Nml Nml Nml Nml Nml Nml N/A      Waveforms:

## 2022-03-22 ENCOUNTER — Other Ambulatory Visit: Payer: Self-pay

## 2022-03-22 DIAGNOSIS — M545 Low back pain, unspecified: Secondary | ICD-10-CM

## 2022-03-27 ENCOUNTER — Ambulatory Visit (INDEPENDENT_AMBULATORY_CARE_PROVIDER_SITE_OTHER): Payer: Medicare Other

## 2022-03-27 DIAGNOSIS — M545 Low back pain, unspecified: Secondary | ICD-10-CM

## 2022-03-28 ENCOUNTER — Encounter: Payer: Medicare Other | Admitting: Neurology

## 2022-04-03 ENCOUNTER — Telehealth: Payer: Medicare Other | Admitting: Nurse Practitioner

## 2022-04-03 DIAGNOSIS — T3695XA Adverse effect of unspecified systemic antibiotic, initial encounter: Secondary | ICD-10-CM

## 2022-04-03 DIAGNOSIS — B379 Candidiasis, unspecified: Secondary | ICD-10-CM | POA: Diagnosis not present

## 2022-04-03 DIAGNOSIS — N3000 Acute cystitis without hematuria: Secondary | ICD-10-CM

## 2022-04-03 MED ORDER — FLUCONAZOLE 150 MG PO TABS: 150.0000 mg | ORAL_TABLET | Freq: Once | ORAL | 0 refills | Status: AC

## 2022-04-03 MED ORDER — CEPHALEXIN 500 MG PO CAPS: 500.0000 mg | ORAL_CAPSULE | Freq: Three times a day (TID) | ORAL | 0 refills | Status: AC

## 2022-04-03 NOTE — Progress Notes (Signed)
E-Visit for Urinary Problems  We are sorry that you are not feeling well.  Here is how we plan to help!  Based on what you shared with me it looks like you most likely have a simple urinary tract infection.  A UTI (Urinary Tract Infection) is a bacterial infection of the bladder.  Most cases of urinary tract infections are simple to treat but a key part of your care is to encourage you to drink plenty of fluids and watch your symptoms carefully.  I have prescribed Keflex 500 mg three times a day for 7 days.  Your symptoms should gradually improve. Call us if the burning in your urine worsens, you develop worsening fever, back pain or pelvic pain or if your symptoms do not resolve after completing the antibiotic.  Urinary tract infections can be prevented by drinking plenty of water to keep your body hydrated.  Also be sure when you wipe, wipe from front to back and don't hold it in!  If possible, empty your bladder every 4 hours.  HOME CARE Drink plenty of fluids Compete the full course of the antibiotics even if the symptoms resolve Remember, when you need to go.go. Holding in your urine can increase the likelihood of getting a UTI! GET HELP RIGHT AWAY IF: You cannot urinate You get a high fever Worsening back pain occurs You see blood in your urine You feel sick to your stomach or throw up You feel like you are going to pass out  MAKE SURE YOU  Understand these instructions. Will watch your condition. Will get help right away if you are not doing well or get worse.   Thank you for choosing an e-visit.  Your e-visit answers were reviewed by a board certified advanced clinical practitioner to complete your personal care plan. Depending upon the condition, your plan could have included both over the counter or prescription medications.  Please review your pharmacy choice. Make sure the pharmacy is open so you can pick up prescription now. If there is a problem, you may contact your  provider through [employer] and have the prescription routed to another pharmacy.  Your safety is important to [place]. If you have drug allergies check your prescription carefully.   For the next 24 hours you can use MyChart to ask questions about today's visit, request a non-urgent call back, or ask for a work or school excuse. You will get an email in the next two days asking about your experience. I hope that your e-visit has been valuable and will speed your recovery.   Meds ordered this encounter  Medications   cephALEXin (KEFLEX) 500 MG capsule    Sig: Take 1 capsule (500 mg total) by mouth 3 (three) times daily for 7 days.    Dispense:  21 capsule    Refill:  0    I spent approximately 5 minutes reviewing the patient's history, current symptoms and coordinating their care today.

## 2022-04-03 NOTE — Addendum Note (Signed)
Addended by: Madilyn Hook on: 04/03/2022 08:21 AM   Modules accepted: Orders

## 2022-04-14 ENCOUNTER — Ambulatory Visit
Admission: RE | Admit: 2022-04-14 | Discharge: 2022-04-14 | Disposition: A | Discharge status: Home / Self Care | Payer: Medicare Other | Source: Ambulatory Visit | Attending: Family Medicine | Admitting: Family Medicine

## 2022-04-14 DIAGNOSIS — M545 Low back pain, unspecified: Secondary | ICD-10-CM

## 2022-04-16 ENCOUNTER — Other Ambulatory Visit: Payer: Self-pay

## 2022-04-16 ENCOUNTER — Encounter: Payer: Self-pay | Admitting: Family Medicine

## 2022-04-16 DIAGNOSIS — M5416 Radiculopathy, lumbar region: Secondary | ICD-10-CM

## 2022-04-18 NOTE — Progress Notes (Signed)
[name] [place] Phone: [phone] Subjective:   Rito Ehrlich, am serving as a scribe for Dr. Hulan Saas. I'm seeing this patient by the request  of:  Kimberly Barrack, MD  CC: Right knee pain, multiple other complaints  [id number]  AHRIYAH HARTKOPF is a 72 y.o. female coming in with complaint of back and neck pain. OMT 02/28/2022. Also f/u for R knee pain. Epi 04/23/2022. Patient states that her pain is worsening in her back.   Notices swelling and pain in back of both knees. Feels like swelling is pressing on the nerve. Seems to have decreased sensation to her feet. R knee seems to be more unsteady.   Also c/o anterior deltoid pain bilaterally. States that her "arms feel like the are going to fall off"  Medications patient has been prescribed: Gabapentin  Taking:         Reviewed prior external information including notes and imaging from previsou exam, outside providers and external EMR if available.   As well as notes that were available from care everywhere and other healthcare systems.  Past medical history, social, surgical and family history all reviewed in electronic medical record.  No pertanent information unless stated regarding to the chief complaint.   Past Medical History:  Diagnosis Date   Arthritis    Cataract    Diverticulosis    Meniere's disease, bilateral    Osteopenia     Allergies  Allergen Reactions   Erythromycin Diarrhea   Clindamycin/Lincomycin Nausea And Vomiting   Morphine And Related Itching   Latex Rash     Review of Systems:  No headache, visual changes, nausea, vomiting, diarrhea, constipation, dizziness, abdominal pain, skin rash, fevers, chills, night sweats, weight loss, swollen lymph nodes, body aches, joint swelling, chest pain, shortness of breath, mood changes. POSITIVE muscle aches  Objective  Blood pressure 120/82, pulse 76, height '5\' 8"'$  (1.727 m),  weight 207 lb (93.9 kg), SpO2 98 %.   General: No apparent distress alert and oriented x3 mood and affect normal, dressed appropriately.  HEENT: Pupils equal, extraocular movements intact  Respiratory: Patient's speak in full sentences and does not appear short of breath  Cardiovascular: No lower extremity edema, non tender, no erythema  Low back exam does have some loss of lordosis.  Some tenderness to palpation in the paraspinal musculature.  Worsening pain with extension of the back noted.  Exams do have tightness of the knees bilaterally.  Patient does have tightness of the hamstrings bilaterally.  Limited muscular skeletal ultrasound was performed and interpreted by Hulan Saas, M  Limited ultrasound of patient's right knee shows a very mild hypoechoic change that is consistent with a Baker's cyst with very small overall.  Otherwise arthritic changes nothing significant changes from previous exam  Osteopathic findings  C6 flexed rotated and side bent left T3 extended rotated and side bent right inhaled rib T9 extended rotated and side bent left L2 flexed rotated and side bent right L4 flexed rotated and side bent right L5 flexed rotated and side bent left Sacrum right on right       Assessment and Plan:  Low back pain potentially associated with spinal stenosis On concern the patient's leg pain is more secondary to her back with lumbar radiculopathy.  Encourage patient to wait to the epidural.  Did do ultrasound of the knees that did not show any significant inflammation noted at this time.  Discussed with  patient about icing regimen and home exercises.  Patient will follow-up with me 6 weeks after the injection.  Baker's cyst of knee, right On ultrasound very mild reaccumulation but not enough to make any significant aspiration at this time.    Nonallopathic problems  Decision today to treat with OMT was based on Physical Exam  After verbal consent patient was treated  with, ME, FPR techniques in cervical, rib, thoracic, lumbar, and sacral  areas  Patient tolerated the procedure well with improvement in symptoms  Patient given exercises, stretches and lifestyle modifications  See medications in patient instructions if given  Patient will follow up in 4-8 weeks    The above documentation has been reviewed and is accurate and complete Lyndal Pulley, DO          Note: This dictation was prepared with Dragon dictation along with smaller phrase technology. Any transcriptional errors that result from this process are unintentional.

## 2022-04-19 ENCOUNTER — Ambulatory Visit (INDEPENDENT_AMBULATORY_CARE_PROVIDER_SITE_OTHER): Payer: Medicare Other | Admitting: Family Medicine

## 2022-04-19 ENCOUNTER — Encounter: Payer: Self-pay | Admitting: Family Medicine

## 2022-04-19 VITALS — BP 120/82 | HR 76 | Ht 68.0 in | Wt 207.0 lb

## 2022-04-19 DIAGNOSIS — M9903 Segmental and somatic dysfunction of lumbar region: Secondary | ICD-10-CM

## 2022-04-19 DIAGNOSIS — M9904 Segmental and somatic dysfunction of sacral region: Secondary | ICD-10-CM | POA: Diagnosis not present

## 2022-04-19 DIAGNOSIS — M9901 Segmental and somatic dysfunction of cervical region: Secondary | ICD-10-CM

## 2022-04-19 DIAGNOSIS — M545 Low back pain, unspecified: Secondary | ICD-10-CM

## 2022-04-19 DIAGNOSIS — M7121 Synovial cyst of popliteal space [Baker], right knee: Secondary | ICD-10-CM | POA: Diagnosis not present

## 2022-04-19 DIAGNOSIS — M9908 Segmental and somatic dysfunction of rib cage: Secondary | ICD-10-CM

## 2022-04-19 DIAGNOSIS — M9902 Segmental and somatic dysfunction of thoracic region: Secondary | ICD-10-CM

## 2022-04-19 NOTE — Assessment & Plan Note (Signed)
On concern the patient's leg pain is more secondary to her back with lumbar radiculopathy.  Encourage patient to wait to the epidural.  Did do ultrasound of the knees that did not show any significant inflammation noted at this time.  Discussed with patient about icing regimen and home exercises.  Patient will follow-up with me 6 weeks after the injection.

## 2022-04-19 NOTE — Patient Instructions (Addendum)
Leg pain coming from the back Take gabapentin '300mg'$  at night I will look for link for compression socks See me as scheduled

## 2022-04-19 NOTE — Assessment & Plan Note (Addendum)
On ultrasound very mild reaccumulation but not enough to make any significant aspiration at this time.  With this continues more the back increase patient's gabapentin to 300 mg at night

## 2022-04-23 ENCOUNTER — Ambulatory Visit
Admission: RE | Admit: 2022-04-23 | Discharge: 2022-04-23 | Disposition: A | Discharge status: Home / Self Care | Payer: Medicare Other | Source: Ambulatory Visit | Attending: Family Medicine | Admitting: Family Medicine

## 2022-04-23 DIAGNOSIS — M5416 Radiculopathy, lumbar region: Secondary | ICD-10-CM

## 2022-04-23 MED ORDER — IOPAMIDOL (ISOVUE-M 200) INJECTION 41%
1.0000 mL | Freq: Once | INTRAMUSCULAR | Status: AC
  Administered 2022-04-23: 1 mL via EPIDURAL

## 2022-04-23 MED ORDER — METHYLPREDNISOLONE ACETATE 40 MG/ML INJ SUSP (RADIOLOG
80.0000 mg | Freq: Once | INTRAMUSCULAR | Status: AC
  Administered 2022-04-23: 80 mg via EPIDURAL

## 2022-04-23 NOTE — Discharge Instructions (Signed)

## 2022-04-30 ENCOUNTER — Ambulatory Visit (INDEPENDENT_AMBULATORY_CARE_PROVIDER_SITE_OTHER): Payer: Medicare Other | Admitting: Physician Assistant

## 2022-04-30 ENCOUNTER — Encounter: Payer: Self-pay | Admitting: Physician Assistant

## 2022-04-30 VITALS — BP 120/70 | HR 70 | Temp 97.5°F | Ht 68.0 in | Wt 203.2 lb

## 2022-04-30 DIAGNOSIS — R300 Dysuria: Secondary | ICD-10-CM

## 2022-04-30 LAB — POCT URINALYSIS DIPSTICK
Bilirubin, UA: NEGATIVE
Blood, UA: 2
Glucose, UA: NEGATIVE
Ketones, UA: NEGATIVE
Nitrite, UA: NEGATIVE
Protein, UA: NEGATIVE
Spec Grav, UA: <=1.005 — AB (ref 1.010–1.025)
Urobilinogen, UA: 0.2 E.U./dL
pH, UA: 7 (ref 5.0–8.0)

## 2022-04-30 MED ORDER — CEPHALEXIN 500 MG PO CAPS: 500.0000 mg | ORAL_CAPSULE | Freq: Two times a day (BID) | ORAL | 0 refills | Status: DC

## 2022-04-30 NOTE — Patient Instructions (Signed)
It was great to see you!  Start keflex antibiotic  General instructions Make sure you: Pee until your bladder is empty. Do not hold pee for a long time. Empty your bladder after sex. Wipe from front to back after pooping if you are a female. Use each tissue one time when you wipe. Drink enough fluid to keep your pee pale yellow. Keep all follow-up visits as told by your doctor. This is important. Contact a doctor if: You do not get better after 1-2 days. Your symptoms go away and then come back. Get help right away if: You have very bad back pain. You have very bad pain in your lower belly. You have a fever. You are sick to your stomach (nauseous). You are throwing up.   Take care,  Inda Coke PA-C

## 2022-04-30 NOTE — Progress Notes (Signed)
Kimberly [name] is a 72 y.o. female here for a recurrence of a previously resolved problem.  History of Present Illness:   Chief Complaint  Patient presents with   Urinary Frequency    Pt c/o urinary frequency and urgency started on Monday, also having dysuria.      UTI Sx Developed diarrhea on Sunday night, unsure why - had this also on Monday Developed bladder pain, frequency, urgency on Monday Diarrhea stopped Has lower abdominal discomfort Denies blood in stool, n/v, malaise, fever, chills, poor appetite, concern for dehydration   Last treated for UTI in February  Past Medical History:  Diagnosis Date   Arthritis    Cataract    Diverticulosis    Meniere's disease, bilateral    Osteopenia      Social History   Tobacco Use   Smoking status: Every Day    Packs/day: 1    Types: Cigarettes   Smokeless tobacco: Never  Vaping Use   Vaping Use: Never used  Substance Use Topics   Alcohol use: Yes    Alcohol/week: 21.0 standard drinks of alcohol    Types: 21 Glasses of wine per week    Comment: 3 glasses of wine per day   Drug use: Never    Past Surgical History:  Procedure Laterality Date   ABDOMINAL HYSTERECTOMY     CHOLECYSTECTOMY     EYE SURGERY     age 70   THYROIDECTOMY, PARTIAL      Family History  Problem Relation Age of Onset   Colon polyps Mother    Heart disease Mother    Heart disease Father    Stomach cancer Paternal Grandfather     Allergies  Allergen Reactions   Erythromycin Diarrhea   Clindamycin/Lincomycin Nausea And Vomiting   Morphine And Related Itching   Latex Rash    Current Medications:   Current Outpatient Medications:    Calcium Carbonate-Vit D-Min (CALCIUM 1200 PO), Take by mouth daily., Disp: , Rfl:    cetirizine (ZYRTEC) 10 MG tablet, Take 10 mg by mouth daily., Disp: , Rfl:    gabapentin (NEURONTIN) 100 MG capsule, Take 2 capsules (200 mg total) by mouth at bedtime., Disp: 180 capsule, Rfl: 0   meclizine (ANTIVERT) 25  MG tablet, Take 1 tablet (25 mg total) by mouth 3 (three) times daily as needed for dizziness., Disp: 60 tablet, Rfl: 0   Multiple Vitamins-Minerals (HAIR SKIN AND NAILS FORMULA PO), Take by mouth., Disp: , Rfl:    Multiple Vitamins-Minerals (WOMENS MULTIVITAMIN PO), Take by mouth., Disp: , Rfl:    nicotine polacrilex (NICOTINE MINI) 2 MG lozenge, Take 1 lozenge (2 mg total) by mouth as needed for smoking cessation., Disp: 100 tablet, Rfl: 0   nystatin cream (MYCOSTATIN), Apply 1 application topically 2 (two) times daily., Disp: 30 g, Rfl: 0   rosuvastatin (CRESTOR) 5 MG tablet, TAKE ONE TABLET BY MOUTH DAILY, Disp: 90 tablet, Rfl: 3   triamcinolone cream (KENALOG) 0.1 %, Apply 1 application topically 2 (two) times daily., Disp: 30 g, Rfl: 0   vitamin E 200 UNIT capsule, Take 200 Units by mouth daily., Disp: , Rfl:    Review of Systems:   Review of Systems  Genitourinary:  Positive for frequency.   Negative unless otherwise specified per HPI.  Vitals:   Vitals:   04/30/22 1111  BP: 120/70  Pulse: 70  Temp: (!) 97.5 F (36.4 C)  TempSrc: Temporal  SpO2: 99%  Weight: 203 lb 4 oz (92.2  kg)  Height: 5\' 8"  (1.727 m)     Body mass index is 30.9 kg/m.  Physical Exam:   Physical Exam Vitals and nursing note reviewed.  Constitutional:      General: She is not in acute distress.    Appearance: She is well-developed. She is not ill-appearing or toxic-appearing.  Cardiovascular:     Rate and Rhythm: Normal rate and regular rhythm.     Pulses: Normal pulses.     Heart sounds: Normal heart sounds, S1 normal and S2 normal.  Pulmonary:     Effort: Pulmonary effort is normal.     Breath sounds: Normal breath sounds.  Abdominal:     General: Abdomen is flat. Bowel sounds are normal.     Palpations: Abdomen is soft.     Tenderness: There is no abdominal tenderness.  Skin:    General: Skin is warm and dry.  Neurological:     Mental Status: She is alert.     GCS: GCS eye subscore is  4. GCS verbal subscore is 5. GCS motor subscore is 6.  Psychiatric:        Speech: Speech normal.        Behavior: Behavior normal. Behavior is cooperative.    Results for orders placed or performed in visit on 04/30/22  POCT urinalysis dipstick  Result Value Ref Range   Color, UA yellow    Clarity, UA cloudy    Glucose, UA Negative Negative   Bilirubin, UA Negative    Ketones, UA Negative    Spec Grav, UA <=1.005 (A) 1.010 - 1.025   Blood, UA 2    pH, UA 7.0 5.0 - 8.0   Protein, UA Negative Negative   Urobilinogen, UA 0.2 0.2 or 1.0 E.U./dL   Nitrite, UA Negative    Leukocytes, UA Small (1+) (A) Negative   Appearance     Odor       Assessment and Plan:   Dysuria No red flags Start keflex 500 mg BID empirically for UTI  Push fluids Follow-up if new/worsening sx Worsening precautions advised on AVS    Inda Coke, PA-C

## 2022-05-02 LAB — URINE CULTURE
MICRO NUMBER:: 14711685
SPECIMEN QUALITY:: ADEQUATE

## 2022-05-03 ENCOUNTER — Encounter: Payer: Self-pay | Admitting: Family Medicine

## 2022-05-03 NOTE — Telephone Encounter (Signed)
Please see pt msg as FYI 

## 2022-05-04 ENCOUNTER — Encounter: Payer: Self-pay | Admitting: Family Medicine

## 2022-06-03 NOTE — Progress Notes (Unsigned)
[facility] [address] [place] [address] Phone: [phone] Subjective:   INadine Counts, am serving as a scribe for Dr. Antoine Primas.  I'm seeing this patient by the request  of:  Ardith Dark, MD  CC: Back and neck pain follow-up  [id number]  Kimberly [name] is a 72 y.o. female coming in with complaint of back and neck pain. OMT 04/19/2022. Also f/u for R knee and lumbar spine pain. Hx of Baker's cyst. Patient states epidural was wonderful. Has worn off, would like another one. Seeing podiatrist tomorrow for toenail.  Medications patient has been prescribed: None  Taking:         Reviewed prior external information including notes and imaging from previsou exam, outside providers and external EMR if available.   As well as notes that were available from care everywhere and other healthcare systems.  Past medical history, social, surgical and family history all reviewed in electronic medical record.  No pertanent information unless stated regarding to the chief complaint.   Past Medical History:  Diagnosis Date   Arthritis    Cataract    Diverticulosis    Meniere's disease, bilateral    Osteopenia     Allergies  Allergen Reactions   Erythromycin Diarrhea   Clindamycin/Lincomycin Nausea And Vomiting   Morphine And Related Itching   Latex Rash     Review of Systems:  No headache, visual changes, nausea, vomiting, diarrhea, constipation, dizziness, abdominal pain, skin rash, fevers, chills, night sweats, weight loss, swollen lymph nodes, body aches, joint swelling, chest pain, shortness of breath, mood changes. POSITIVE muscle aches  Objective  Blood pressure 132/84, pulse 82, height  (1.727 m), weight 204 lb (92.5 kg), SpO2 97 %.   General: No apparent distress alert and oriented x3 mood and affect normal, dressed appropriately.  HEENT: Pupils equal, extraocular movements intact  Respiratory: Patient's speak in  full sentences and does not appear short of breath  Cardiovascular: No lower extremity edema, non tender, no erythema  Neck exam does have some mild loss of lordosis.  Tenderness to palpation with sidebending bilaterally.  Patient does have some mild increase in kyphosis of the upper back.  Tightness noted of the lower back as well.  Negative straight leg test noted.  Osteopathic findings  C3 flexed rotated and side bent right C7 flexed rotated and side bent left T3 extended rotated and side bent right inhaled rib L2 flexed rotated and side bent right L5 flexed rotated and side bent left Sacrum right on right       Assessment and Plan:  Low back pain potentially associated with spinal stenosis Patient does have low back pain with likely spinal stenosis.  Still discussing at great length.  Have discussed different medications such as the gabapentin.  Could consider repeating the epidural with patient making improvement.  I do think that this could be beneficial with patient feeling like he did make some improvement overall.  Follow-up again in 6 to 8 weeks.    Nonallopathic problems  Decision today to treat with OMT was based on Physical Exam  After verbal consent patient was treated with HVLA, ME, FPR techniques in cervical, rib, thoracic, lumbar, and sacral  areas  Patient tolerated the procedure well with improvement in symptoms  Patient given exercises, stretches and lifestyle modifications  See medications in patient instructions if given  Patient will follow up in 4-8 weeks     The above documentation  has been reviewed and is accurate and complete Lyndal Pulley, DO         Note: This dictation was prepared with Dragon dictation along with smaller phrase technology. Any transcriptional errors that result from this process are unintentional.

## 2022-06-04 ENCOUNTER — Encounter: Payer: Self-pay | Admitting: Family Medicine

## 2022-06-04 ENCOUNTER — Ambulatory Visit (INDEPENDENT_AMBULATORY_CARE_PROVIDER_SITE_OTHER): Payer: Medicare Other | Admitting: Family Medicine

## 2022-06-04 VITALS — BP 132/84 | HR 82 | Ht 68.0 in | Wt 204.0 lb

## 2022-06-04 DIAGNOSIS — M545 Low back pain, unspecified: Secondary | ICD-10-CM

## 2022-06-04 DIAGNOSIS — M9902 Segmental and somatic dysfunction of thoracic region: Secondary | ICD-10-CM | POA: Diagnosis not present

## 2022-06-04 DIAGNOSIS — M9903 Segmental and somatic dysfunction of lumbar region: Secondary | ICD-10-CM

## 2022-06-04 DIAGNOSIS — M9908 Segmental and somatic dysfunction of rib cage: Secondary | ICD-10-CM

## 2022-06-04 DIAGNOSIS — M9901 Segmental and somatic dysfunction of cervical region: Secondary | ICD-10-CM | POA: Diagnosis not present

## 2022-06-04 DIAGNOSIS — M9904 Segmental and somatic dysfunction of sacral region: Secondary | ICD-10-CM | POA: Diagnosis not present

## 2022-06-04 NOTE — Patient Instructions (Signed)
Good to see you! See you again in 6-8 weeks 

## 2022-06-04 NOTE — Assessment & Plan Note (Signed)
Patient does have low back pain with likely spinal stenosis.  Still discussing at great length.  Have discussed different medications such as the gabapentin.  Could consider repeating the epidural with patient making improvement.  I do think that this could be beneficial with patient feeling like he did make some improvement overall.  Follow-up again in 6 to 8 weeks.

## 2022-06-05 ENCOUNTER — Ambulatory Visit (INDEPENDENT_AMBULATORY_CARE_PROVIDER_SITE_OTHER): Payer: Medicare Other | Admitting: Podiatry

## 2022-06-05 DIAGNOSIS — L600 Ingrowing nail: Secondary | ICD-10-CM

## 2022-06-05 NOTE — Patient Instructions (Signed)

## 2022-06-05 NOTE — Progress Notes (Signed)
   Chief Complaint  Patient presents with   Ingrown Toenail    Patient came in today for ingrown toenail right foot 2nd toe, and foot and nail fungus, started having pain 3 months ago, patient has been using OTC fungal medication.     HPI: 72 y.o. female presenting today for evaluation of symptomatic toenail to the right second digit.  Patient has noticed that the nail is incurvated into the sides of her toe and is very symptomatic and painful.  She does have history of ingrown toenails and partial nail matricectomy to the right great toe  Past Medical History:  Diagnosis Date   Arthritis    Cataract    Diverticulosis    Meniere's disease, bilateral    Osteopenia     Past Surgical History:  Procedure Laterality Date   ABDOMINAL HYSTERECTOMY     CHOLECYSTECTOMY     EYE SURGERY     age 21   THYROIDECTOMY, PARTIAL      Allergies  Allergen Reactions   Erythromycin Diarrhea   Clindamycin/Lincomycin Nausea And Vomiting   Morphine And Related Itching   Latex Rash     Physical Exam: General: The patient is alert and oriented x3 in no acute distress.  Dermatology: Skin is warm, dry and supple bilateral lower extremities.  Incurvated nail noted to the right second digit with associated tenderness with palpation  Vascular: Palpable pedal pulses bilaterally. Capillary refill within normal limits.  No appreciable edema.  No erythema.  Neurological: Grossly intact via light touch  Musculoskeletal Exam: No pedal deformities noted   Assessment/Plan of Care: 1.  Symptomatic painful ingrown toenail/pincer nail deformity right second toe  -Patient evaluated. -Different treatment options were offered and provided for the patient including simple debridement versus total temporary nail avulsion and ultimately total permanent nail matricectomy.  For now the patient would like to have the nail removed and allow the new nail to grow in -The nail was prepped in aseptic manner and digital  block performed using 3 mL of 2% lidocaine plain.  The nail was avulsed in its entirety followed by dry sterile dressings -Post care instructions provided -Return to clinic as needed       Felecia Shelling, DPM [facility]  Dr. Felecia Shelling, DPM    [address] [place], [place] [address]                Office [phone]  Fax [phone]

## 2022-06-28 ENCOUNTER — Ambulatory Visit
Admission: RE | Admit: 2022-06-28 | Discharge: 2022-06-28 | Disposition: A | Discharge status: Home / Self Care | Payer: Medicare Other | Source: Ambulatory Visit | Attending: Family Medicine | Admitting: Family Medicine

## 2022-06-28 DIAGNOSIS — M545 Low back pain, unspecified: Secondary | ICD-10-CM

## 2022-06-28 MED ORDER — IOPAMIDOL (ISOVUE-M 200) INJECTION 41%
1.0000 mL | Freq: Once | INTRAMUSCULAR | Status: AC
  Administered 2022-06-28: 1 mL via EPIDURAL

## 2022-06-28 MED ORDER — METHYLPREDNISOLONE ACETATE 40 MG/ML INJ SUSP (RADIOLOG
80.0000 mg | Freq: Once | INTRAMUSCULAR | Status: AC
  Administered 2022-06-28: 80 mg via EPIDURAL

## 2022-06-28 NOTE — Discharge Instructions (Signed)

## 2022-07-01 ENCOUNTER — Encounter: Payer: Self-pay | Admitting: Family Medicine

## 2022-07-15 NOTE — Progress Notes (Unsigned)
[facility] [address] [place] [address] Phone: [phone] Subjective:   Kimberly [name], am serving as a scribe for Dr. Antoine Primas.  I'm seeing this patient by the request  of:  Ardith Dark, MD  CC: Low back pain follow-up  [id number]  Kimberly [name] is a 72 y.o. female coming in with complaint of back and neck pain. OMT 06/04/2022. Patient states that she had epidural on 06/28/2022. Feels like she never had injection as her pain is back. Neck has been bothering her more.   Also feels like there is a fullness in back of both knees.   Had toenail removed on R foot second toe and that toe no longer has sensations. Also c/o thickening of L great toe nail.   Medications patient has been prescribed: None  Taking:      Since we have seen patient patient was also seen by podiatry for ingrown toenail   Reviewed prior external information including notes and imaging from previsou exam, outside providers and external EMR if available.   As well as notes that were available from care everywhere and other healthcare systems.  Past medical history, social, surgical and family history all reviewed in electronic medical record.  No pertanent information unless stated regarding to the chief complaint.   Past Medical History:  Diagnosis Date   Arthritis    Cataract    Diverticulosis    Meniere's disease, bilateral    Osteopenia     Allergies  Allergen Reactions   Erythromycin Diarrhea   Clindamycin/Lincomycin Nausea And Vomiting   Morphine And Codeine Itching   Latex Rash     Review of Systems:  No headache, visual changes, nausea, vomiting, diarrhea, constipation, dizziness, abdominal pain, skin rash, fevers, chills, night sweats, weight loss, swollen lymph nodes, body aches, joint swelling, chest pain, shortness of breath, mood changes. POSITIVE muscle aches  Objective  Blood pressure 126/82, pulse 79, height 5\' 8"  (1.727 m),  weight 202 lb (91.6 kg), SpO2 96 %.   General: No apparent distress alert and oriented x3 mood and affect normal, dressed appropriately.  HEENT: Pupils equal, extraocular movements intact  Respiratory: Patient's speak in full sentences and does not appear short of breath  Cardiovascular: No lower extremity edema, non tender, no erythema  Gait antalgic MSK:  Back does have significant loss of lordosis.  Tightness noted with FABER test bilaterally. Worsening pain with extension greater than 10 degrees  Osteopathic findings  C2 flexed rotated and side bent right C7 flexed rotated and side bent left T3 extended rotated and side bent right inhaled rib T9 extended rotated and side bent left L3 flexed rotated and side bent right Sacrum right on right       Assessment and Plan:  Low back pain potentially associated with spinal stenosis Low back with spinal stenosis noted.  Discussed icing regimen and home exercises, discussed which activities to do and which ones to avoid.  Activity slowly over the course of next several weeks.  Follow-up with me again in 6 to 8 weeks  Degenerative arthritis of knee, bilateral Degenerative arthritis bilaterally.  Wants to hold on any injections at this time.  Last injections were in August.  Could potentially use viscosupplementation again if necessary.    Nonallopathic problems  Decision today to treat with OMT was based on Physical Exam  After verbal consent patient was treated with HVLA, ME, FPR techniques in cervical, rib, thoracic, lumbar, and sacral  areas  Patient tolerated the procedure well with improvement in symptoms  Patient given exercises, stretches and lifestyle modifications  See medications in patient instructions if given  Patient will follow up in 4-8 weeks     The above documentation has been reviewed and is accurate and complete Judi Saa, DO         Note: This dictation was prepared with Dragon dictation  along with smaller phrase technology. Any transcriptional errors that result from this process are unintentional.

## 2022-07-16 ENCOUNTER — Encounter: Payer: Self-pay | Admitting: Family Medicine

## 2022-07-16 ENCOUNTER — Ambulatory Visit (INDEPENDENT_AMBULATORY_CARE_PROVIDER_SITE_OTHER): Payer: Medicare Other | Admitting: Family Medicine

## 2022-07-16 VITALS — BP 126/82 | HR 79 | Ht 68.0 in | Wt 202.0 lb

## 2022-07-16 DIAGNOSIS — M9908 Segmental and somatic dysfunction of rib cage: Secondary | ICD-10-CM

## 2022-07-16 DIAGNOSIS — M9903 Segmental and somatic dysfunction of lumbar region: Secondary | ICD-10-CM

## 2022-07-16 DIAGNOSIS — M545 Low back pain, unspecified: Secondary | ICD-10-CM

## 2022-07-16 DIAGNOSIS — M9902 Segmental and somatic dysfunction of thoracic region: Secondary | ICD-10-CM

## 2022-07-16 DIAGNOSIS — M9904 Segmental and somatic dysfunction of sacral region: Secondary | ICD-10-CM

## 2022-07-16 DIAGNOSIS — M9901 Segmental and somatic dysfunction of cervical region: Secondary | ICD-10-CM

## 2022-07-16 DIAGNOSIS — M170 Bilateral primary osteoarthritis of knee: Secondary | ICD-10-CM

## 2022-07-16 NOTE — Assessment & Plan Note (Signed)
Low back with spinal stenosis noted.  Discussed icing regimen and home exercises, discussed which activities to do and which ones to avoid.  Activity slowly over the course of next several weeks.  Follow-up with me again in 6 to 8 weeks

## 2022-07-16 NOTE — Assessment & Plan Note (Signed)
Degenerative arthritis bilaterally.  Wants to hold on any injections at this time.  Last injections were in August.  Could potentially use viscosupplementation again if necessary.

## 2022-07-16 NOTE — Patient Instructions (Signed)
See me in 5-6 weeks 

## 2022-08-07 ENCOUNTER — Ambulatory Visit (INDEPENDENT_AMBULATORY_CARE_PROVIDER_SITE_OTHER): Payer: Medicare Other | Admitting: Podiatry

## 2022-08-07 DIAGNOSIS — L603 Nail dystrophy: Secondary | ICD-10-CM | POA: Diagnosis not present

## 2022-08-07 NOTE — Progress Notes (Signed)
   Chief Complaint  Patient presents with   Nail Problem    Right foot nail fungus    HPI: 72 y.o. female presenting today for new complaint of pain associated to the left hallux nail plate.  Patient has noticed over the last few months that the nail plate has become discolored and thickened.  She presents for further treatment and evaluation  Past Medical History:  Diagnosis Date   Arthritis    Cataract    Diverticulosis    Meniere's disease, bilateral    Osteopenia     Past Surgical History:  Procedure Laterality Date   ABDOMINAL HYSTERECTOMY     CHOLECYSTECTOMY     EYE SURGERY     age 44   THYROIDECTOMY, PARTIAL      Allergies  Allergen Reactions   Erythromycin Diarrhea   Clindamycin/Lincomycin Nausea And Vomiting   Morphine And Codeine Itching   Latex Rash     Physical Exam: General: The patient is alert and oriented x3 in no acute distress.  Dermatology: Skin is warm, dry and supple bilateral lower extremities.  Hyperkeratotic nail plate noted to the left hallux which is partially detached at the distal half of the underlying nailbed  Vascular: Palpable pedal pulses bilaterally. Capillary refill within normal limits.  No appreciable edema.  No erythema.  Neurological: Grossly intact via light touch  Musculoskeletal Exam: No pedal deformities noted   Assessment/Plan of Care: 1.  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 -Clinical debridement of the nail plate was performed today on the way back to the base of the nail.  Patient tolerated this well.  The remaining portion of nail was smoothed with a rotary bur without incident -Recommend OTC Tolcylen antifungal topical nail rejuvenation to apply daily as the nail grows back out -Return to clinic as needed       Felecia Shelling, DPM [facility]  Dr. Felecia Shelling, DPM    [address] [place], [place] [address]                 Office [phone]  Fax [phone]

## 2022-08-09 ENCOUNTER — Encounter: Payer: Self-pay | Admitting: Family Medicine

## 2022-08-27 NOTE — Progress Notes (Unsigned)
[name] [address] [place] [address] Phone: [phone] Subjective:   Bruce Donath, am serving as a scribe for Dr. Antoine Primas.  I'm seeing this patient by the request  of:  Ardith Dark, MD  CC: Back and neck pain follow-up  [id number]  Kimberly [name] is a 72 y.o. female coming in with complaint of back and neck pain. OMT 07/16/2022. Patient states that both knees are tight in the back of the knee.   Has been more active recently. Feels like her L foot will have foot drop at times. Helped a friend move recently and this exacerbated her lower back pain.   Also c/o tightness in neck today. Also noticing "butt knots" in her glutes.   Saw podiatry and she had some nail removed but it is coming back.   Medications patient has been prescribed: None  Taking:         Reviewed prior external information including notes and imaging from previsou exam, outside providers and external EMR if available.   As well as notes that were available from care everywhere and other healthcare systems.  Past medical history, social, surgical and family history all reviewed in electronic medical record.  No pertanent information unless stated regarding to the chief complaint.   Past Medical History:  Diagnosis Date   Arthritis    Cataract    Diverticulosis    Meniere's disease, bilateral    Osteopenia     Allergies  Allergen Reactions   Erythromycin Diarrhea   Clindamycin/Lincomycin Nausea And Vomiting   Morphine And Codeine Itching   Latex Rash     Review of Systems:  No headache, visual changes, nausea, vomiting, diarrhea, constipation, dizziness, abdominal pain, skin rash, fevers, chills, night sweats, weight loss, swollen lymph nodes, body aches, joint swelling, chest pain, shortness of breath, mood changes. POSITIVE muscle aches  Objective  Blood pressure 138/88, pulse 84, height 5\' 8"  (1.727 m), weight 208 lb (94.3 kg), SpO2  98%.   General: No apparent distress alert and oriented x3 mood and affect normal, dressed appropriately.  HEENT: Pupils equal, extraocular movements intact  Respiratory: Patient's speak in full sentences and does not appear short of breath  Cardiovascular: No lower extremity edema, non tender, no erythema  Antalgic gait noted.  Patient does have tenderness over the knee itself.  Does have some instability with valgus and varus force.  Limited muscular skeletal ultrasound was performed and interpreted by Antoine Primas, M   Limited ultrasound shows the patient does have a hypoechoic change that is compressible that is the Baker's cyst but it does appear to be ruptured ongoing just deep to the medial gastrocnemius muscle. Impression: Ruptured Baker's cyst  Osteopathic findings  C2 flexed rotated and side bent right C7 flexed rotated and side bent left T3 extended rotated and side bent right inhaled rib T9 extended rotated and side bent left T11 extended rotated and side bent right L2 flexed rotated and side bent right L4 flexed rotated and side bent left Sacrum right on right   Assessment and Plan:  Baker's cyst of knee, right Unfortunately patient did have recent what appears to be rupture of the Baker's cyst.  Discussed with patient at great length that it is not worth trying to do an aspiration when it is already ruptured.  We discussed with patient about potential compression in massage potentially heat.  Increase activity slowly otherwise.  Follow-up again in 6 to 8 weeks  otherwise.    Nonallopathic problems  Decision today to treat with OMT was based on Physical Exam  After verbal consent patient was treated with HVLA, ME, FPR techniques in cervical, rib, thoracic, lumbar, and sacral  areas  Patient tolerated the procedure well with improvement in symptoms  Patient given exercises, stretches and lifestyle modifications  See medications in patient instructions if  given  Patient will follow up in 4-8 weeks     The above documentation has been reviewed and is accurate and complete Judi Saa, DO         Note: This dictation was prepared with Dragon dictation along with smaller phrase technology. Any transcriptional errors that result from this process are unintentional.

## 2022-08-28 ENCOUNTER — Other Ambulatory Visit: Payer: Self-pay

## 2022-08-28 ENCOUNTER — Ambulatory Visit: Payer: Medicare Other | Admitting: Family Medicine

## 2022-08-28 ENCOUNTER — Encounter: Payer: Self-pay | Admitting: Family Medicine

## 2022-08-28 VITALS — BP 138/88 | HR 84 | Ht 68.0 in | Wt 208.0 lb

## 2022-08-28 DIAGNOSIS — M9902 Segmental and somatic dysfunction of thoracic region: Secondary | ICD-10-CM

## 2022-08-28 DIAGNOSIS — M9901 Segmental and somatic dysfunction of cervical region: Secondary | ICD-10-CM

## 2022-08-28 DIAGNOSIS — M25562 Pain in left knee: Secondary | ICD-10-CM

## 2022-08-28 DIAGNOSIS — M9904 Segmental and somatic dysfunction of sacral region: Secondary | ICD-10-CM

## 2022-08-28 DIAGNOSIS — G8929 Other chronic pain: Secondary | ICD-10-CM

## 2022-08-28 DIAGNOSIS — M9903 Segmental and somatic dysfunction of lumbar region: Secondary | ICD-10-CM | POA: Diagnosis not present

## 2022-08-28 DIAGNOSIS — M7121 Synovial cyst of popliteal space [Baker], right knee: Secondary | ICD-10-CM | POA: Diagnosis not present

## 2022-08-28 DIAGNOSIS — M25561 Pain in right knee: Secondary | ICD-10-CM | POA: Diagnosis not present

## 2022-08-28 DIAGNOSIS — M9908 Segmental and somatic dysfunction of rib cage: Secondary | ICD-10-CM | POA: Diagnosis not present

## 2022-08-28 NOTE — Assessment & Plan Note (Signed)
Unfortunately patient did have recent what appears to be rupture of the Baker's cyst.  Discussed with patient at great length that it is not worth trying to do an aspiration when it is already ruptured.  We discussed with patient about potential compression in massage potentially heat.  Increase activity slowly otherwise.  Follow-up again in 6 to 8 weeks otherwise.

## 2022-08-28 NOTE — Patient Instructions (Addendum)
  Baker's cyst ruptured See me in 2 months

## 2022-09-27 ENCOUNTER — Encounter: Payer: Self-pay | Admitting: Family Medicine

## 2022-10-09 ENCOUNTER — Encounter: Payer: Self-pay | Admitting: Family Medicine

## 2022-11-06 ENCOUNTER — Ambulatory Visit: Payer: Medicare Other | Admitting: Family Medicine

## 2022-11-14 NOTE — Progress Notes (Signed)
  [facility] [address] [place] [address] Phone: [phone] Subjective:   Bruce Donath, am serving as a scribe for Dr. Antoine Primas.  I'm seeing this patient by the request  of:  Ardith Dark, MD  CC: Low back pain follow-up  [id number]  Kimberly [name] is a 72 y.o. female coming in with complaint of back and neck pain. OMT on 08/28/2022. Patient states that she is the same as last visit.  Overall still has some planning but does not feel like anything is worsening.  No worsening of the Baker's cyst at the moment.  Has been traveling a lot.  Denies any numbness or tingling.          Reviewed prior external information including notes and imaging from previsou exam, outside providers and external EMR if available.   As well as notes that were available from care everywhere and other healthcare systems.  Past medical history, social, surgical and family history all reviewed in electronic medical record.  No pertanent information unless stated regarding to the chief complaint.   Past Medical History:  Diagnosis Date   Arthritis    Cataract    Diverticulosis    Meniere's disease, bilateral    Osteopenia     Allergies  Allergen Reactions   Erythromycin Diarrhea   Clindamycin/Lincomycin Nausea And Vomiting   Morphine And Codeine Itching   Latex Rash     Review of Systems:  No headache, visual changes, nausea, vomiting, diarrhea, constipation, dizziness, abdominal pain, skin rash, fevers, chills, night sweats, weight loss, swollen lymph nodes, body aches, joint swelling, chest pain, shortness of breath, mood changes. POSITIVE muscle aches  Objective  Blood pressure 124/62, pulse 69, height 5\' 8"  (1.727 m), weight 208 lb (94.3 kg), SpO2 97%.   General: No apparent distress alert and oriented x3 mood and affect normal, dressed appropriately.  HEENT: Pupils equal, extraocular movements intact  Respiratory: Patient's speak in  full sentences and does not appear short of breath  Cardiovascular: No lower extremity edema, non tender, no erythema   MSK:  Back does have some loss lordosis noted.  Some tenderness to palpation in the paraspinal musculature.  Osteopathic findings   T8 extended rotated and side bent left L1 flexed rotated and side bent right Sacrum right on right       Assessment and Plan:  Low back pain potentially associated with spinal stenosis Patient low back does have some loss of lordosis noted.  Some tender to palpation in the paraspinal musculature.  Discussed icing regimen and home exercises.  Increase activity slowly.  Follow-up again in 6 to 8 weeks otherwise.    Nonallopathic problems  Decision today to treat with OMT was based on Physical Exam  After verbal consent patient was treated with HVLA, ME, FPR techniques in thoracic, lumbar, and sacral  areas  Patient tolerated the procedure well with improvement in symptoms given exercises, stretches and lifestyle modifications  See medications in patient instructions if given  Patient will follow up in 4-8 weeks     The above documentation has been reviewed and is accurate and complete Judi Saa, DO         Note: This dictation was prepared with Dragon dictation along with smaller phrase technology. Any transcriptional errors that result from this process are unintentional.

## 2022-11-15 ENCOUNTER — Encounter: Payer: Self-pay | Admitting: Family Medicine

## 2022-11-15 ENCOUNTER — Ambulatory Visit (INDEPENDENT_AMBULATORY_CARE_PROVIDER_SITE_OTHER): Payer: Medicare Other | Admitting: Family Medicine

## 2022-11-15 VITALS — BP 124/62 | HR 69 | Ht 68.0 in | Wt 208.0 lb

## 2022-11-15 DIAGNOSIS — M9903 Segmental and somatic dysfunction of lumbar region: Secondary | ICD-10-CM

## 2022-11-15 DIAGNOSIS — M9902 Segmental and somatic dysfunction of thoracic region: Secondary | ICD-10-CM | POA: Diagnosis not present

## 2022-11-15 DIAGNOSIS — M545 Low back pain, unspecified: Secondary | ICD-10-CM

## 2022-11-15 DIAGNOSIS — M9904 Segmental and somatic dysfunction of sacral region: Secondary | ICD-10-CM | POA: Diagnosis not present

## 2022-11-15 NOTE — Assessment & Plan Note (Signed)
Patient low back does have some loss of lordosis noted.  Some tender to palpation in the paraspinal musculature.  Discussed icing regimen and home exercises.  Increase activity slowly.  Follow-up again in 6 to 8 weeks otherwise.

## 2022-11-15 NOTE — Patient Instructions (Signed)
See me in 4-6 weeks

## 2022-11-27 ENCOUNTER — Other Ambulatory Visit: Payer: Self-pay | Admitting: Family Medicine

## 2022-11-27 DIAGNOSIS — Z1231 Encounter for screening mammogram for malignant neoplasm of breast: Secondary | ICD-10-CM

## 2022-12-02 ENCOUNTER — Other Ambulatory Visit: Payer: Self-pay | Admitting: *Deleted

## 2022-12-12 ENCOUNTER — Other Ambulatory Visit: Payer: Self-pay | Admitting: Family Medicine

## 2022-12-19 NOTE — Progress Notes (Signed)
[facility] [address] [place] [address] Phone: [phone] Subjective:   Bruce Donath, am serving as a scribe for Dr. Antoine Primas.  I'm seeing this patient by the request  of:  Ardith Dark, MD  CC: Back and neck pain follow-up  [id number]  Kimberly [name] is a 72 y.o. female coming in with complaint of back and neck pain. OMT on 11/15/2022. Patient states that she is the same as last visit. Unsure how her baker's cysts are doing.   Her feet are not doing well but no change from last visit. Has a corn on R foot.   Back and neck are the same as last visit.   Medications patient has been prescribed:   Taking:         Reviewed prior external information including notes and imaging from previsou exam, outside providers and external EMR if available.   As well as notes that were available from care everywhere and other healthcare systems.  Past medical history, social, surgical and family history all reviewed in electronic medical record.  No pertanent information unless stated regarding to the chief complaint.   Past Medical History:  Diagnosis Date   Arthritis    Cataract    Diverticulosis    Meniere's disease, bilateral    Osteopenia     Allergies  Allergen Reactions   Erythromycin Diarrhea   Clindamycin/Lincomycin Nausea And Vomiting   Morphine And Codeine Itching   Latex Rash     Review of Systems:  No headache, visual changes, nausea, vomiting, diarrhea, constipation, dizziness, abdominal pain, skin rash, fevers, chills, night sweats, weight loss, swollen lymph nodes, body aches, joint swelling, chest pain, shortness of breath, mood changes. POSITIVE muscle aches  Objective  Blood pressure (!) 148/86, pulse 81, height 5\' 8"  (1.727 m), weight 205 lb (93 kg), SpO2 98%.   General: No apparent distress alert and oriented x3 mood and affect normal, dressed appropriately.  HEENT: Pupils equal, extraocular movements  intact  Respiratory: Patient's speak in full sentences and does not appear short of breath  Cardiovascular: No lower extremity edema, non tender, no erythema  Low back does have loss of lordosis noted.  Limited extension noted.  Foot exam does show that there is some pes planus still.  Neurovascularly intact distally.  Osteopathic findings t C6 flexed rotated and side bent left T3 extended rotated and side bent right inhaled rib T9 extended rotated and side bent left L2 flexed rotated and side bent right Sacrum right on right       Assessment and Plan:  Low back pain potentially associated with spinal stenosis Continues to work with [place] with her low back.  Discussed continuing to stay active.  Patient is monitoring her weight and has lost 3 pounds since previous exam.  Encouraged her to continue to work on this.  No medication changes.  Follow-up with me again in 2 months    Nonallopathic problems  Decision today to treat with OMT was based on Physical Exam  After verbal consent patient was treated with HVLA, ME, FPR techniques in cervical, rib, thoracic, lumbar, and sacral  areas  Patient tolerated the procedure well with improvement in symptoms  Patient given exercises, stretches and lifestyle modifications  See medications in patient instructions if given  Patient will follow up in 8 weeks  Total time with patient today discussing with her knees, looking at her feet, as well as discussing her back 31 minutes.  The above documentation has been reviewed and is accurate and complete Judi Saa, DO         Note: This dictation was prepared with Dragon dictation along with smaller phrase technology. Any transcriptional errors that result from this process are unintentional.

## 2022-12-20 ENCOUNTER — Ambulatory Visit (INDEPENDENT_AMBULATORY_CARE_PROVIDER_SITE_OTHER): Payer: Medicare Other | Admitting: Family Medicine

## 2022-12-20 ENCOUNTER — Encounter: Payer: Self-pay | Admitting: Family Medicine

## 2022-12-20 VITALS — BP 148/86 | HR 81 | Ht 68.0 in | Wt 205.0 lb

## 2022-12-20 DIAGNOSIS — M9903 Segmental and somatic dysfunction of lumbar region: Secondary | ICD-10-CM

## 2022-12-20 DIAGNOSIS — M9901 Segmental and somatic dysfunction of cervical region: Secondary | ICD-10-CM

## 2022-12-20 DIAGNOSIS — M545 Low back pain, unspecified: Secondary | ICD-10-CM

## 2022-12-20 DIAGNOSIS — M9902 Segmental and somatic dysfunction of thoracic region: Secondary | ICD-10-CM

## 2022-12-20 DIAGNOSIS — M9904 Segmental and somatic dysfunction of sacral region: Secondary | ICD-10-CM | POA: Diagnosis not present

## 2022-12-20 DIAGNOSIS — M9908 Segmental and somatic dysfunction of rib cage: Secondary | ICD-10-CM

## 2022-12-20 NOTE — Patient Instructions (Signed)
Wart remover cream daily with Band-Aid for one week Do not wear both insoles Bakers cyst look good See me in 8 weeks

## 2022-12-20 NOTE — Assessment & Plan Note (Signed)
Continues to work with [place] with her low back.  Discussed continuing to stay active.  Patient is monitoring her weight and has lost 3 pounds since previous exam.  Encouraged her to continue to work on this.  No medication changes.  Follow-up with me again in 2 months

## 2022-12-24 ENCOUNTER — Other Ambulatory Visit: Payer: Self-pay | Admitting: Family Medicine

## 2022-12-24 ENCOUNTER — Ambulatory Visit
Admission: RE | Admit: 2022-12-24 | Discharge: 2022-12-24 | Disposition: A | Discharge status: Home / Self Care | Payer: Medicare Other | Source: Ambulatory Visit

## 2022-12-24 DIAGNOSIS — Z1231 Encounter for screening mammogram for malignant neoplasm of breast: Secondary | ICD-10-CM

## 2023-01-01 ENCOUNTER — Encounter: Payer: Self-pay | Admitting: Podiatry

## 2023-01-01 ENCOUNTER — Ambulatory Visit (INDEPENDENT_AMBULATORY_CARE_PROVIDER_SITE_OTHER): Payer: Medicare Other | Admitting: Podiatry

## 2023-01-01 DIAGNOSIS — L600 Ingrowing nail: Secondary | ICD-10-CM | POA: Diagnosis not present

## 2023-01-01 DIAGNOSIS — L603 Nail dystrophy: Secondary | ICD-10-CM

## 2023-01-01 NOTE — Patient Instructions (Signed)

## 2023-01-01 NOTE — Progress Notes (Signed)
  Subjective:  Patient ID: Kimberly [name], female    DOB: [birth year],   MRN: [id number]  Chief Complaint  Patient presents with   Nail Problem    72 y.o. female presents for concern of right second toe pain and left great toe changes. Relates the right second toe has continued to be painful and would like to have it removed. Relates she is not sure the left one is doing great either. Has had it debrided by Dr. Logan Bores in the past . Denies any other pedal complaints. Denies n/v/f/c.   Past Medical History:  Diagnosis Date   Arthritis    Cataract    Diverticulosis    Meniere's disease, bilateral    Osteopenia     Objective:  Physical Exam: Vascular: DP/PT pulses 2/4 bilateral. CFT <3 seconds. Normal hair growth on digits. No edema.  Skin. No lacerations or abrasions bilateral feet. Right second digit thickened and incurvated with pincer type nail tender to palpation. No erythema edema or purulence noted. Left hallux nail proximal 1/3 normal in appearance some dystrophic changes and thickening noted distally but mild.  Musculoskeletal: MMT 5/5 bilateral lower extremities in DF, PF, Inversion and Eversion. Deceased ROM in DF of ankle joint.  Neurological: Sensation intact to light touch.   Assessment:   1. Dystrophic nail   2. Ingrown toenail of right foot      Plan:  Patient was evaluated and treated and all questions answered. Discussed ingrown toenails etiology and treatment options including procedure for removal vs conservative care.  Patient requesting removal of ingrown nail today. Procedure below.  Discussed procedure and post procedure care and patient expressed understanding.  Discussed continuing to watch the great toe as it may continue to grow normal. If does not then can consider removal Will follow-up in 2 weeks for nail check or sooner if any problems arise.    Procedure:  Procedure: total Nail Avulsion of right second digit nail Surgeon: Louann Sjogren, DPM   Pre-op Dx: Ingrown toenail without infection Post-op: Same  Place of Surgery: Office exam room.  Indications for surgery: Painful and ingrown toenail.    The patient is requesting removal of nail with  chemical matrixectomy. Risks and complications were discussed with the patient for which they understand and written consent was obtained. Under sterile conditions a total of 3 mL of  1% lidocaine plain was infiltrated in a hallux block fashion. Once anesthetized, the skin was prepped in sterile fashion. A tourniquet was then applied. Next the entire right second digit nail was removed.  Next phenol was then applied under standard conditions to permanently destroy the matrix and copiously irrigated. Silvadene was applied. A dry sterile dressing was applied. After application of the dressing the tourniquet was removed and there is found to be an immediate capillary refill time to the digit. The patient tolerated the procedure well without any complications. Post procedure instructions were discussed the patient for which he verbally understood. Follow-up in two weeks for nail check or sooner if any problems are to arise. Discussed signs/symptoms of infection and directed to call the office immediately should any occur or go directly to the emergency room. In the meantime, encouraged to call the office with any questions, concerns, changes symptoms.   Louann Sjogren, DPM

## 2023-01-03 ENCOUNTER — Ambulatory Visit: Payer: Medicare Other | Admitting: Podiatry

## 2023-03-05 ENCOUNTER — Ambulatory Visit: Payer: Medicare Other | Admitting: Physician Assistant

## 2023-03-05 ENCOUNTER — Ambulatory Visit: Payer: Medicare Other | Admitting: Family Medicine

## 2023-03-05 NOTE — Progress Notes (Unsigned)
  [facility] [address] [place] [address] Phone: [phone] Subjective:   Kimberly [name], am serving as a scribe for Dr. Antoine Primas.  I'm seeing this patient by the request  of:  Ardith Dark, MD  CC: Back and neck pain  [id number]  Kimberly [name] is a 73 y.o. female coming in with complaint of back and neck pain. OMT on 12/20/2022. Patient states same per usual. Check bakers cyst. No new concerns. Look at labs.  Medications patient has been prescribed:   Taking:         Reviewed prior external information including notes and imaging from previsou exam, outside providers and external EMR if available.   As well as notes that were available from care everywhere and other healthcare systems.  Past medical history, social, surgical and family history all reviewed in electronic medical record.  No pertanent information unless stated regarding to the chief complaint.   Past Medical History:  Diagnosis Date   Arthritis    Cataract    Diverticulosis    Meniere's disease, bilateral    Osteopenia     Allergies  Allergen Reactions   Erythromycin Diarrhea   Clindamycin/Lincomycin Nausea And Vomiting   Morphine And Codeine Itching   Latex Rash     Review of Systems:  No headache, visual changes, nausea, vomiting, diarrhea, constipation, dizziness, abdominal pain, skin rash, fevers, chills, night sweats, weight loss, swollen lymph nodes, body aches, joint swelling, chest pain, shortness of breath, mood changes. POSITIVE muscle aches  Objective  Blood pressure 128/70, pulse 70, height 5\' 8"  (1.727 m), weight 208 lb (94.3 kg), SpO2 97%.   General: No apparent distress alert and oriented x3 mood and affect normal, dressed appropriately.  HEENT: Pupils equal, extraocular movements intact  Respiratory: Patient's speak in full sentences and does not appear short of breath  Cardiovascular: No lower extremity edema, non tender, no  erythema  Gait MSK:  Back does have some loss lordosis noted.  Some tenderness to palpation in the paraspinal musculature.  Osteopathic findings C7 flexed rotated and side bent left T3 extended rotated and side bent right inhaled rib T8 extended rotated and side bent left L2 flexed rotated and side bent right  Sacrum right on right     Assessment and Plan:  Low back pain potentially associated with spinal stenosis Continues to have significant tightness noted.  Does respond relatively well though to osteopathic manipulation.  Continue to encourage patient to continue to be active.  Will continue to work on core strength, do think patient will have significant improvement in symptoms if we can get BMI at least under 30.  Follow-up again in 6 to 8 weeks.    Nonallopathic problems  Decision today to treat with OMT was based on Physical Exam  After verbal consent patient was treated with , ME, FPR techniques in cervical, rib, thoracic, lumbar, and sacral  areas  Patient tolerated the procedure well with improvement in symptoms  Patient given exercises, stretches and lifestyle modifications  See medications in patient instructions if given  Patient will follow up in 4-8 weeks    The above documentation has been reviewed and is accurate and complete Judi Saa, DO          Note: This dictation was prepared with Dragon dictation along with smaller phrase technology. Any transcriptional errors that result from this process are unintentional.

## 2023-03-06 ENCOUNTER — Encounter: Payer: Self-pay | Admitting: Family Medicine

## 2023-03-06 ENCOUNTER — Ambulatory Visit: Payer: Medicare Other | Admitting: Family Medicine

## 2023-03-06 VITALS — BP 147/83 | HR 64 | Temp 97.7°F | Ht 68.0 in | Wt 206.0 lb

## 2023-03-06 DIAGNOSIS — F320 Major depressive disorder, single episode, mild: Secondary | ICD-10-CM

## 2023-03-06 DIAGNOSIS — G2581 Restless legs syndrome: Secondary | ICD-10-CM

## 2023-03-06 DIAGNOSIS — H8103 Meniere's disease, bilateral: Secondary | ICD-10-CM

## 2023-03-06 DIAGNOSIS — R5383 Other fatigue: Secondary | ICD-10-CM

## 2023-03-06 DIAGNOSIS — F17200 Nicotine dependence, unspecified, uncomplicated: Secondary | ICD-10-CM

## 2023-03-06 DIAGNOSIS — E538 Deficiency of other specified B group vitamins: Secondary | ICD-10-CM

## 2023-03-06 DIAGNOSIS — R600 Localized edema: Secondary | ICD-10-CM | POA: Diagnosis not present

## 2023-03-06 DIAGNOSIS — E782 Mixed hyperlipidemia: Secondary | ICD-10-CM | POA: Diagnosis not present

## 2023-03-06 DIAGNOSIS — E559 Vitamin D deficiency, unspecified: Secondary | ICD-10-CM

## 2023-03-06 DIAGNOSIS — J329 Chronic sinusitis, unspecified: Secondary | ICD-10-CM

## 2023-03-06 DIAGNOSIS — R739 Hyperglycemia, unspecified: Secondary | ICD-10-CM | POA: Diagnosis not present

## 2023-03-06 DIAGNOSIS — R799 Abnormal finding of blood chemistry, unspecified: Secondary | ICD-10-CM

## 2023-03-06 LAB — CBC
HCT: 39.9 % (ref 36.0–46.0)
Hemoglobin: 12.8 g/dL (ref 12.0–15.0)
MCHC: 31.9 g/dL (ref 30.0–36.0)
MCV: 79.5 fL (ref 78.0–100.0)
Platelets: 265 10*3/uL (ref 150.0–400.0)
RBC: 5.03 Mil/uL (ref 3.87–5.11)
RDW: 15.1 % (ref 11.5–15.5)
WBC: 5.5 10*3/uL (ref 4.0–10.5)

## 2023-03-06 LAB — COMPREHENSIVE METABOLIC PANEL
ALT: 28 U/L (ref 0–35)
AST: 21 U/L (ref 0–37)
Albumin: 4.2 g/dL (ref 3.5–5.2)
Alkaline Phosphatase: 93 U/L (ref 39–117)
BUN: 16 mg/dL (ref 6–23)
CO2: 30 meq/L (ref 19–32)
Calcium: 9.3 mg/dL (ref 8.4–10.5)
Chloride: 103 meq/L (ref 96–112)
Creatinine, Ser: 0.86 mg/dL (ref 0.40–1.20)
GFR: 66.85 mL/min (ref >60.00)
Glucose, Bld: 79 mg/dL (ref 70–99)
Potassium: 4.5 meq/L (ref 3.5–5.1)
Sodium: 140 meq/L (ref 135–145)
Total Bilirubin: 0.5 mg/dL (ref 0.2–1.2)
Total Protein: 6.4 g/dL (ref 6.0–8.3)

## 2023-03-06 LAB — LIPID PANEL
Cholesterol: 172 mg/dL (ref 0–200)
HDL: 60.7 mg/dL (ref >39.00)
LDL Cholesterol: 93 mg/dL (ref 0–99)
NonHDL: 111.53
Total CHOL/HDL Ratio: 3
Triglycerides: 93 mg/dL (ref 0.0–149.0)
VLDL: 18.6 mg/dL (ref 0.0–40.0)

## 2023-03-06 LAB — IBC + FERRITIN
Ferritin: 136.3 ng/mL (ref 10.0–291.0)
Iron: 61 ug/dL (ref 42–145)
Saturation Ratios: 18.9 % — ABNORMAL LOW (ref 20.0–50.0)
TIBC: 323.4 ug/dL (ref 250.0–450.0)
Transferrin: 231 mg/dL (ref 212.0–360.0)

## 2023-03-06 LAB — HEMOGLOBIN A1C: Hgb A1c MFr Bld: 5.7 % (ref 4.6–6.5)

## 2023-03-06 LAB — TSH: TSH: 1.29 u[IU]/mL (ref 0.35–5.50)

## 2023-03-06 LAB — VITAMIN D 25 HYDROXY (VIT D DEFICIENCY, FRACTURES): VITD: 47.83 ng/mL (ref 30.00–100.00)

## 2023-03-06 LAB — VITAMIN B12: Vitamin B-12: 1087 pg/mL — ABNORMAL HIGH (ref 211–911)

## 2023-03-06 MED ORDER — AZELASTINE HCL 0.1 % NA SOLN: 2.0000 | Freq: Two times a day (BID) | NASAL | 12 refills | Status: AC

## 2023-03-06 MED ORDER — BUPROPION HCL ER (XL) 150 MG PO TB24: 150.0000 mg | ORAL_TABLET | Freq: Every day | ORAL | 5 refills | Status: DC

## 2023-03-06 MED ORDER — AMOXICILLIN-POT CLAVULANATE 875-125 MG PO TABS: 1.0000 | ORAL_TABLET | Freq: Two times a day (BID) | ORAL | 0 refills | Status: DC

## 2023-03-06 MED ORDER — BENZONATATE 200 MG PO CAPS: 200.0000 mg | ORAL_CAPSULE | Freq: Two times a day (BID) | ORAL | 0 refills | Status: AC | PRN

## 2023-03-06 NOTE — Assessment & Plan Note (Signed)
Mild flare likely due to her current URI and increased sodium intake.

## 2023-03-06 NOTE — Assessment & Plan Note (Signed)
 On Crestor 5 mg daily.  Tolerating well.  Check lipids.

## 2023-03-06 NOTE — Assessment & Plan Note (Signed)
No red flags.  No signs of volume overload.  She does have a few varicosities on legs as well-likely has mild venous insufficiency.  She has had quite a bit of salty food intake recently which is likely exacerbated her edema as well.  We did discuss conservative measures including leg elevation, salt avoidance, and compression stockings.  She would like to avoid medication at this point.  Will check labs today.  She will let us know if this becomes more of an issue or she develops any other concerning signs or symptoms.

## 2023-03-06 NOTE — Assessment & Plan Note (Signed)
Check B12 

## 2023-03-06 NOTE — Progress Notes (Signed)
Kimberly [name] is a 73 y.o. female who presents today for an office visit.  Assessment/Plan:  New/Acute Problems: Sinusitis  No red flags.  Given length of symptoms would be reasonable for [place] to treat with course of Augmentin at this point.  Also start Astelin and Tessalon.  Encouraged hydration.  She will let us know if not improving and we can refer her to ENT.  Chronic Problems Addressed Today: Leg edema No red flags.  No signs of volume overload.  She does have a few varicosities on legs as well-likely has mild venous insufficiency.  She has had quite a bit of salty food intake recently which is likely exacerbated her edema as well.  We did discuss conservative measures including leg elevation, salt avoidance, and compression stockings.  She would like to avoid medication at this point.  Will check labs today.  She will let us know if this becomes more of an issue or she develops any other concerning signs or symptoms.  B12 deficiency Check B12.  Hyperlipidemia, mixed On Crestor 5 mg daily.  Tolerating well.  Check lipids.  Nicotine dependence with current use Patient was asked about her tobacco use today and was strongly advised to quit. Patient is currently trying to cut down.  She has tried cutting down cold [place] in the past.. We reviewed treatment options to assist her quit smoking including NRT, Chantix, and Bupropion.  Will be starting Wellbutrin as below for her depression which should help with nicotine cravings as well.  Follow up at next office visit.   Total time spent counseling approximately 3 minutes.    Depression, major, single episode, mild (HCC) Elevated PHQ score today.  No SI or HI.  This has been an intermittent issue with her for several years.  It has been a few years since she has been on any medications for this.  We did discuss treatment options.  She is not interested in therapy at this point.  Due to her concurrent nicotine use would be reasonable for [place] to  start Wellbutrin at this time.  She believes that she has tolerated this well in the past.  We did discuss potential side effects.  Will start with 50 mg daily.  She will follow-up with [place] in a few weeks via MyChart we can adjust as tolerated.  We discussed reasons to return to care and seek emergent care.  Meniere's disease of both ears Mild flare likely due to her current URI and increased sodium intake.     Subjective:  HPI:  See Assessment / plan for status of chronic conditions.  Patient is here today with multiple issues that she would like to discuss.  I last saw her a little over a year ago.  Since her last visit she has been following with podiatry and sports medicine.  She also had a couple of issues with UTI.  She recently went on a cruise and is now concerned that she may have a sinus infection.  She started developing symptoms of URI a couple of weeks ago.  Saw physician on the crew ship and was diagnosed with an upper respiratory infection.  She was started on Z-Pak and Mucinex.  Symptoms did improve modestly though still is having quite a bit of nasal congestion and fatigue.  Also feels off balance.  She is concerned about flareup of her Mnire's disease.  She did have some wheezing and fever at that initial time of illness however this is since  resolved.  Her RSV, flu, and COVID test were all negative.  Symptoms been stable the last few days though are still persistent.  She is also having ongoing issues with leg swelling.  This been going on for quite a while but does seem to be getting worse recently.  No orthopnea.  No reported chest pain or shortness of breath.  No specific treatments tried.    She has also had worsening mood the last several months.  She has been under quite a bit of stress.  She has moved a few times in the last 3 years.  She has had intermittent issues with depression in the past and has been on multiple medications.     03/06/2023   11:35 AM 12/18/2021     9:58 AM 12/11/2020    9:01 AM 05/11/2020    1:11 PM 09/01/2019    9:16 AM  Depression screen PHQ 2/9  Decreased Interest 3 0 0 0 0  Down, Depressed, Hopeless 3 0 0 0 0  PHQ - 2 Score 6 0 0 0 0  Altered sleeping 3      Tired, decreased energy 3      Change in appetite 3      Feeling bad or failure about yourself  1      Trouble concentrating 1      Moving slowly or fidgety/restless 2      Suicidal thoughts 0      PHQ-9 Score 19      Difficult doing work/chores Somewhat difficult             Objective:  Physical Exam: BP (!) 147/83   Pulse 64   Temp 97.7 F (36.5 C) (Temporal)   Ht 5\' 8"  (1.727 m)   Wt 206 lb (93.4 kg)   SpO2 95%   BMI 31.32 kg/m   Wt Readings from Last 3 Encounters:  03/06/23 206 lb (93.4 kg)  12/20/22 205 lb (93 kg)  11/15/22 208 lb (94.3 kg)    Gen: No acute distress, resting comfortably HEENT: TMs with clear effusion bilaterally. CV: Regular rate and rhythm with no murmurs appreciated Pulm: Normal work of breathing, clear to auscultation bilaterally with no crackles, wheezes, or rhonchi MUSCULOSKELETAL: Trace edema to knees bilaterally Neuro: Grossly normal, moves all extremities Psych: Normal affect and thought content  Time Spent: 45 minutes of total time was spent on the date of the encounter performing the following actions: chart review prior to seeing the patient, obtaining history, performing a medically necessary exam, counseling on the treatment plan, placing orders, and documenting in our EHR.         Katina Degree. [name], MD 03/06/2023 12:39 PM

## 2023-03-06 NOTE — Assessment & Plan Note (Signed)
Elevated PHQ score today.  No SI or HI.  This has been an intermittent issue with her for several years.  It has been a few years since she has been on any medications for this.  We did discuss treatment options.  She is not interested in therapy at this point.  Due to her concurrent nicotine use would be reasonable for [place] to start Wellbutrin at this time.  She believes that she has tolerated this well in the past.  We did discuss potential side effects.  Will start with 50 mg daily.  She will follow-up with [place] in a few weeks via MyChart we can adjust as tolerated.  We discussed reasons to return to care and seek emergent care.

## 2023-03-06 NOTE — Patient Instructions (Addendum)
It was very nice to see you today!  I think you have developed a sinus infection.  Please start the Augmentin, Astelin, and Tessalon.  Your body is holding onto extra water.  Please try to keep your legs elevated.  You can also use compression stockings.  Please try to avoid excessive salt intake.  Will check labs today.  Please start the Wellbutrin to help you with your mood.  Let me know how this is working for you in a couple of weeks.   Return if symptoms worsen or fail to improve.   Take care, Dr [name]  PLEASE NOTE:  If you had any lab tests, please let us know if you have not heard back within a few days. You may see your results on mychart before we have a chance to review them but we will give you a call once they are reviewed by [place].   If we ordered any referrals today, please let us know if you have not heard from their office within the next week.   If you had any urgent prescriptions sent in today, please check with the pharmacy within an hour of our visit to make sure the prescription was transmitted appropriately.   Please try these tips to maintain a healthy lifestyle:  Eat at least 3 REAL meals and 1-2 snacks per day.  Aim for no more than 5 hours between eating.  If you eat breakfast, please do so within one hour of getting up.   Each meal should contain half fruits/vegetables, one quarter protein, and one quarter carbs (no bigger than a computer mouse)  Cut down on sweet beverages. This includes juice, soda, and sweet tea.   Drink at least 1 glass of water with each meal and aim for at least 8 glasses per day  Exercise at least 150 minutes every week.

## 2023-03-06 NOTE — Assessment & Plan Note (Signed)
Patient was asked about her tobacco use today and was strongly advised to quit. Patient is currently trying to cut down.  She has tried cutting down cold [place] in the past.. We reviewed treatment options to assist her quit smoking including NRT, Chantix, and Bupropion.  Will be starting Wellbutrin as below for her depression which should help with nicotine cravings as well.  Follow up at next office visit.   Total time spent counseling approximately 3 minutes.

## 2023-03-07 ENCOUNTER — Ambulatory Visit (INDEPENDENT_AMBULATORY_CARE_PROVIDER_SITE_OTHER): Payer: Medicare Other | Admitting: Family Medicine

## 2023-03-07 ENCOUNTER — Encounter: Payer: Self-pay | Admitting: Family Medicine

## 2023-03-07 VITALS — BP 128/70 | HR 70 | Ht 68.0 in | Wt 208.0 lb

## 2023-03-07 DIAGNOSIS — M9902 Segmental and somatic dysfunction of thoracic region: Secondary | ICD-10-CM | POA: Diagnosis not present

## 2023-03-07 DIAGNOSIS — M9904 Segmental and somatic dysfunction of sacral region: Secondary | ICD-10-CM | POA: Diagnosis not present

## 2023-03-07 DIAGNOSIS — M9908 Segmental and somatic dysfunction of rib cage: Secondary | ICD-10-CM

## 2023-03-07 DIAGNOSIS — M9901 Segmental and somatic dysfunction of cervical region: Secondary | ICD-10-CM

## 2023-03-07 DIAGNOSIS — M9903 Segmental and somatic dysfunction of lumbar region: Secondary | ICD-10-CM

## 2023-03-07 DIAGNOSIS — M545 Low back pain, unspecified: Secondary | ICD-10-CM

## 2023-03-07 NOTE — Patient Instructions (Signed)
See you again in 5-6 weeks

## 2023-03-07 NOTE — Progress Notes (Signed)
Her labs are all at goal.  She should keep up the great work with diet and exercise.  Do not need to make any changes to her treatment plan at this time.  We can recheck again in a year.

## 2023-03-08 ENCOUNTER — Encounter: Payer: Self-pay | Admitting: Family Medicine

## 2023-03-08 NOTE — Assessment & Plan Note (Signed)
Continues to have significant tightness noted.  Does respond relatively well though to osteopathic manipulation.  Continue to encourage patient to continue to be active.  Will continue to work on core strength, do think patient will have significant improvement in symptoms if we can get BMI at least under 30.  Follow-up again in 6 to 8 weeks.

## 2023-03-10 NOTE — Telephone Encounter (Signed)
Pharmacy report printed

## 2023-03-31 ENCOUNTER — Encounter: Payer: Self-pay | Admitting: Family Medicine

## 2023-04-01 NOTE — Telephone Encounter (Signed)
 Ok to send referral

## 2023-04-01 NOTE — Telephone Encounter (Signed)
 Ok to refer.  Kimberly [name]. [name], MD 04/01/2023 12:09 PM

## 2023-04-02 ENCOUNTER — Other Ambulatory Visit: Payer: Self-pay | Admitting: *Deleted

## 2023-04-04 ENCOUNTER — Other Ambulatory Visit: Payer: Self-pay | Admitting: *Deleted

## 2023-04-04 DIAGNOSIS — H93239 Hyperacusis, unspecified ear: Secondary | ICD-10-CM

## 2023-04-04 NOTE — Telephone Encounter (Signed)
 Referral placed.

## 2023-04-09 NOTE — Progress Notes (Unsigned)
 [facility] [address] [place] [address] Phone: [phone] Subjective:   INadine Counts, am serving as a scribe for Dr. Antoine Primas.  I'm seeing this patient by the request  of:  Ardith Dark, MD  CC: back and neck pain f/u   [id number]  VERINA GALENO is a 73 y.o. female coming in with complaint of back and neck pain. OMT 03/07/2023. Patient states neck an shoulder and lower back.Patient feels like manipulation will be helpful.  Pain has been of fairly severe around the neck but nothing as severe as what she has had previously.  Medications patient has been prescribed: None  Taking:         Reviewed prior external information including notes and imaging from previsou exam, outside providers and external EMR if available.   As well as notes that were available from care everywhere and other healthcare systems.  Past medical history, social, surgical and family history all reviewed in electronic medical record.  No pertanent information unless stated regarding to the chief complaint.   Past Medical History:  Diagnosis Date   Arthritis    Cataract    Diverticulosis    Meniere's disease, bilateral    Osteopenia     Allergies  Allergen Reactions   Erythromycin Diarrhea   Clindamycin/Lincomycin Nausea And Vomiting   Morphine And Codeine Itching   Latex Rash     Review of Systems:  No headache, visual changes, nausea, vomiting, diarrhea, constipation, dizziness, abdominal pain, skin rash, fevers, chills, night sweats, weight loss, swollen lymph nodes, body aches, joint swelling, chest pain, shortness of breath, mood changes. POSITIVE muscle aches  Objective  Blood pressure 122/88, pulse 73, height 5\' 8"  (1.727 m), weight 205 lb (93 kg), SpO2 96%.   General: No apparent distress alert and oriented x3 mood and affect normal, dressed appropriately.  HEENT: Pupils equal, extraocular movements intact  Respiratory: Patient's  speak in full sentences and does not appear short of breath  Cardiovascular: No lower extremity edema, non tender, no erythema  Gait MSK:  Back does have loss of lordosis noted.  Significant decrease in rotation of the neck noted.  Osteopathic findings  C2 flexed rotated and side bent right C6 flexed rotated and side bent right T3 extended rotated and side bent right inhaled rib T9 extended rotated and side bent right L2 flexed rotated and side bent right L5 flexed rotated and side bent left Sacrum right on right       Assessment and Plan:  Low back pain potentially associated with spinal stenosis Responded relatively well though to osteopathic relation.  We discussed continuing to work on core strengthening.  Increase activity slowly over the course the next several weeks.  Discussed icing regimen.  Follow-up with me again in 6 to 8 weeks otherwise.    Nonallopathic problems  Decision today to treat with OMT was based on Physical Exam  After verbal consent patient was treated with HVLA, ME, FPR techniques in cervical, rib, thoracic, lumbar, and sacral  areas  Patient tolerated the procedure well with improvement in symptoms  Patient given exercises, stretches and lifestyle modifications  See medications in patient instructions if given  Patient will follow up in 4-8 weeks      The above documentation has been reviewed and is accurate and complete Judi Saa, DO        Note: This dictation was prepared with Dragon dictation along with smaller phrase technology. Any  transcriptional errors that result from this process are unintentional.

## 2023-04-10 ENCOUNTER — Encounter: Payer: Self-pay | Admitting: Family Medicine

## 2023-04-10 ENCOUNTER — Ambulatory Visit (INDEPENDENT_AMBULATORY_CARE_PROVIDER_SITE_OTHER): Payer: Medicare Other | Admitting: Family Medicine

## 2023-04-10 VITALS — BP 122/88 | HR 73 | Ht 68.0 in | Wt 205.0 lb

## 2023-04-10 DIAGNOSIS — M9904 Segmental and somatic dysfunction of sacral region: Secondary | ICD-10-CM | POA: Diagnosis not present

## 2023-04-10 DIAGNOSIS — M9902 Segmental and somatic dysfunction of thoracic region: Secondary | ICD-10-CM

## 2023-04-10 DIAGNOSIS — M545 Low back pain, unspecified: Secondary | ICD-10-CM

## 2023-04-10 DIAGNOSIS — M9903 Segmental and somatic dysfunction of lumbar region: Secondary | ICD-10-CM

## 2023-04-10 DIAGNOSIS — M9901 Segmental and somatic dysfunction of cervical region: Secondary | ICD-10-CM

## 2023-04-10 DIAGNOSIS — M9908 Segmental and somatic dysfunction of rib cage: Secondary | ICD-10-CM | POA: Diagnosis not present

## 2023-04-10 NOTE — Assessment & Plan Note (Signed)
 Responded relatively well though to osteopathic relation.  We discussed continuing to work on core strengthening.  Increase activity slowly over the course the next several weeks.  Discussed icing regimen.  Follow-up with me again in 6 to 8 weeks otherwise.

## 2023-04-10 NOTE — Patient Instructions (Signed)
Good to see you! See you again in 6-8 weeks 

## 2023-04-24 ENCOUNTER — Encounter: Payer: Self-pay | Admitting: Family Medicine

## 2023-04-24 ENCOUNTER — Ambulatory Visit (INDEPENDENT_AMBULATORY_CARE_PROVIDER_SITE_OTHER): Admitting: Family Medicine

## 2023-04-24 VITALS — BP 136/80 | HR 68 | Temp 97.4°F | Ht 68.0 in | Wt 201.0 lb

## 2023-04-24 DIAGNOSIS — F320 Major depressive disorder, single episode, mild: Secondary | ICD-10-CM

## 2023-04-24 DIAGNOSIS — F17200 Nicotine dependence, unspecified, uncomplicated: Secondary | ICD-10-CM

## 2023-04-24 DIAGNOSIS — J329 Chronic sinusitis, unspecified: Secondary | ICD-10-CM | POA: Diagnosis not present

## 2023-04-24 DIAGNOSIS — F17210 Nicotine dependence, cigarettes, uncomplicated: Secondary | ICD-10-CM | POA: Diagnosis not present

## 2023-04-24 MED ORDER — AMOXICILLIN-POT CLAVULANATE 875-125 MG PO TABS: 1.0000 | ORAL_TABLET | Freq: Two times a day (BID) | ORAL | 0 refills | Status: DC

## 2023-04-24 NOTE — Assessment & Plan Note (Signed)
 Patient was asked about her tobacco use today and was strongly advised to quit. Patient is currently cutting down.  She is doing very well with Wellbutrin.  She would like to continue dose.  She is not about 6 cigarettes over the last 6 weeks.  Congratulated patient on weaning down. Follow up at next office visit.   Total time spent counseling approximately 3 minutes.

## 2023-04-24 NOTE — Patient Instructions (Signed)
 It was very nice to see you today!  I think you have a sinus infection.  Start the Augmentin.  Continue the nasal spray.  Let us know if not improving.  Return if symptoms worsen or fail to improve.   Take care, Dr [name]  PLEASE NOTE:  If you had any lab tests, please let us know if you have not heard back within a few days. You may see your results on mychart before we have a chance to review them but we will give you a call once they are reviewed by [place].   If we ordered any referrals today, please let us know if you have not heard from their office within the next week.   If you had any urgent prescriptions sent in today, please check with the pharmacy within an hour of our visit to make sure the prescription was transmitted appropriately.   Please try these tips to maintain a healthy lifestyle:  Eat at least 3 REAL meals and 1-2 snacks per day.  Aim for no more than 5 hours between eating.  If you eat breakfast, please do so within one hour of getting up.   Each meal should contain half fruits/vegetables, one quarter protein, and one quarter carbs (no bigger than a computer mouse)  Cut down on sweet beverages. This includes juice, soda, and sweet tea.   Drink at least 1 glass of water with each meal and aim for at least 8 glasses per day  Exercise at least 150 minutes every week.

## 2023-04-24 NOTE — Progress Notes (Signed)
   Kimberly [name] is a 73 y.o. female who presents today for an office visit.  Assessment/Plan:  New/Acute Problems: Sinusitis  Likely bacterial sinusitis given her URI symptoms a few weeks ago that improved then worsened several days ago.  Would be reasonable for [place] to start another round of Augmentin at this time.  She did well with this a few months ago.  She also restart her Astelin nasal spray.  She has prescriptions for this on hand and does not need refill today.  Encouraged hydration.  She can continue over-the-counter meds as needed as well.  If this continues to be an ongoing issue we can refer to ENT.  Chronic Problems Addressed Today: Nicotine dependence with current use Patient was asked about her tobacco use today and was strongly advised to quit. Patient is currently cutting down.  She is doing very well with Wellbutrin.  She would like to continue dose.  She is not about 6 cigarettes over the last 6 weeks.  Congratulated patient on weaning down. Follow up at next office visit.   Total time spent counseling approximately 3 minutes.    Depression, major, single episode, mild (HCC) She is tolerating her Wellbutrin well without side effects.  She is not sure if this has made much of a difference with her mood as of yet though she does note that she has been sick for the last few weeks.  We did discuss changing dose however we will hold off on this for now.  Continue Wellbutrin 150 mg daily.  She will follow-up in a few weeks via MyChart we can adjust the dose as needed.      Subjective:  HPI:  See Assessment / plan for status of chronic conditions. Patient here with sinus congestion. This started several days ago. She did have a sinus infection a couple of months ago that cleared up with Augmentin. She is having more neck pain. Some fullness in her right ear. She is having a lot facial pain and pressure. She has noticed some dried blood in her nose as well. She did have cold symptoms  a couple of weeks ago.   At our last visit we started her on Wellbutrin for mood and smoking cessation. She has done very well with this. She has smoked 6 cigarettes in total since our last visit a couple of months ago. No longer craves cigarettes and does get enjoyment from smoking.        Objective:  Physical Exam: BP 136/80   Pulse 68   Temp (!) 97.4 F (36.3 C) (Temporal)   Ht 5\' 8"  (1.727 m)   Wt 201 lb (91.2 kg)   SpO2 96%   BMI 30.56 kg/m   Gen: No acute distress, resting comfortably HEENT: Left TM clear.  Right TM with clear effusion.  OP erythematous.  Nasal mucosa erythematous and boggy bilaterally with thick discharge. CV: Regular rate and rhythm with no murmurs appreciated Pulm: Normal work of breathing, clear to auscultation bilaterally with no crackles, wheezes, or rhonchi Neuro: Grossly normal, moves all extremities Psych: Normal affect and thought content      Kimberly [name] M. [name], MD 04/24/2023 9:18 AM

## 2023-04-24 NOTE — Assessment & Plan Note (Signed)
 She is tolerating her Wellbutrin well without side effects.  She is not sure if this has made much of a difference with her mood as of yet though she does note that she has been sick for the last few weeks.  We did discuss changing dose however we will hold off on this for now.  Continue Wellbutrin 150 mg daily.  She will follow-up in a few weeks via MyChart we can adjust the dose as needed.

## 2023-05-02 ENCOUNTER — Encounter: Payer: Self-pay | Admitting: Family Medicine

## 2023-05-05 NOTE — Telephone Encounter (Signed)
 The 24-hour tablet only goes as low as 150 mg however we can send in a 12-hour release 100 mg tablet if she wishes to try this instead.  Okay to send in for 90 days if she wishes to change to this.

## 2023-05-06 NOTE — Telephone Encounter (Signed)
 See note

## 2023-05-07 ENCOUNTER — Other Ambulatory Visit: Payer: Self-pay | Admitting: *Deleted

## 2023-05-07 MED ORDER — BUPROPION HCL ER (XL) 300 MG PO TB24: 300.0000 mg | ORAL_TABLET | Freq: Every day | ORAL | 0 refills | Status: DC

## 2023-05-07 NOTE — Telephone Encounter (Signed)
 Ok to send in 300 mg tablet for 90 day supply. Would like for her to follow up with [place] in a few weeks.  Kimberly [name]. [name], MD 05/07/2023 7:29 AM

## 2023-05-13 ENCOUNTER — Ambulatory Visit: Payer: Medicare Other | Attending: Family Medicine | Admitting: Audiologist

## 2023-05-13 DIAGNOSIS — H903 Sensorineural hearing loss, bilateral: Secondary | ICD-10-CM | POA: Insufficient documentation

## 2023-05-13 NOTE — Procedures (Addendum)
 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 Wednesday stated that she was diagnosed with Meniere's Disease in 1993. Kimberly [name] reported that she has had the feeling of fullness in her right ear for approximately a month. Kimberly [name] denied any tinnitus but did note she has been having worsening allergies including a sinus infection.  Kimberly [name] reported that she has only had one Meniere's event since being diagnosed where she felt off balance and had to hold on to walls. This episode lasted 1 day and was resolved with Meclizine. Kimberly [name] stated her Meniere's is well controlled by dietary changes. Kimberly [name] was last seen  for a hearing evaluation in February of 2023, with results revealing normal hearing sensitivity at [phone] sloping to a mild sensorineural hearing loss in the right ear and a moderate sensorineural hearing loss in the left ear. A sensorineural asymmetry was noted at 4000 Hz, with the left ear being worse.   Evaluation:  Otoscopy showed a clear view of the tympanic membranes, bilaterally Tympanometry results were consistent with normal movement (Type A) of the tympanic membranes, bilaterally  Audiometric testing was completed using Conventional Audiometry techniques with supraural headphones. Test results are consistent with normal hearing sensitivity 250 Hz to 2000 Hz sloping to a mild to moderate sensorineural hearing loss from 3000 Hz to 8000 Hz in the right ear. In the left ear, test results are consistent with normal hearing sensitivity from 250  Hz to 2000 Hz  sloping to mild to moderately severe sensorineural hearing loss. Speech Recognition Thresholds were obtained at 15 dB HL in the right ear and at 10  dB HL in the left ear. Word Recognition Testing was completed at 55 dB HL in the right ear and 50 dB HL in the left ear and [name] scored 100 % and 96% respectively.    Results:  The test results were reviewed with [name] and she has normal hearing sensitivity 250 Hz to 2000 Hz sloping to a mild to moderate sensorineural hearing loss from 3000 Hz to 8000 Hz in the right ear. In the left ear, she has  normal hearing sensitivity from 250 Hz to 2000 Hz sloping to mild to moderately severe sensorineural hearing loss. Kimberly [name] hearing loss has worsen since she was last seen in  February of 2023, in both ears. Tympanometry revealed normal movement of both tympanic membranes. Kimberly [name] is unlikely to benefit from hearing aids at this time, however, she was counseled on using good communication strategies especially in adverse listening environments.    Recommendations: 1.   Continued annual monitoring of hearing, or sooner if concerns arise.  40 minutes spent testing and counseling on results.   If you have any questions please feel free to contact me at [phone].  Ammie Ferrier Audiologist, Au.D., CCC-A 05/13/2023  10:43 AM  Kimberly [name].  Audiology Student   During this evaluation, the Audiologist was present, participating in and directing the student.  I agree with the following procedure note after reviewing documentation. This session was performed under the supervision of a licensed clinician.  During this session, the Audiologist  was present, participating in and directing the treatment.   Cc: Ardith Dark, MD

## 2023-05-21 ENCOUNTER — Ambulatory Visit: Payer: Medicare Other | Admitting: Family Medicine

## 2023-05-29 NOTE — Telephone Encounter (Signed)
 Please schedule a f/u appt with Dr Daneil Dunker  For Medication reaction

## 2023-05-29 NOTE — Telephone Encounter (Signed)
**Note De-identified  [place]bright Obfuscation** Please advise 

## 2023-06-02 ENCOUNTER — Encounter: Payer: Self-pay | Admitting: Family Medicine

## 2023-06-02 ENCOUNTER — Ambulatory Visit (INDEPENDENT_AMBULATORY_CARE_PROVIDER_SITE_OTHER): Admitting: Family Medicine

## 2023-06-02 VITALS — BP 137/79 | HR 74 | Temp 97.0°F | Ht 68.0 in | Wt 205.4 lb

## 2023-06-02 DIAGNOSIS — F320 Major depressive disorder, single episode, mild: Secondary | ICD-10-CM | POA: Diagnosis not present

## 2023-06-02 DIAGNOSIS — R030 Elevated blood-pressure reading, without diagnosis of hypertension: Secondary | ICD-10-CM | POA: Insufficient documentation

## 2023-06-02 DIAGNOSIS — H8103 Meniere's disease, bilateral: Secondary | ICD-10-CM

## 2023-06-02 MED ORDER — BUPROPION HCL ER (XL) 150 MG PO TB24: 150.0000 mg | ORAL_TABLET | Freq: Every day | ORAL | 0 refills | Status: AC

## 2023-06-02 MED ORDER — MECLIZINE HCL 25 MG PO TABS: 25.0000 mg | ORAL_TABLET | Freq: Three times a day (TID) | ORAL | 0 refills | Status: AC | PRN

## 2023-06-02 MED ORDER — BLOOD PRESSURE KIT: PACK | 0 refills | Status: DC

## 2023-06-02 NOTE — Assessment & Plan Note (Signed)
 Will refill meclizine .  She has done well with this.

## 2023-06-02 NOTE — Progress Notes (Signed)
   Kimberly [name] is a 73 y.o. female who presents today for an office visit.  Assessment/Plan:   Chronic Problems Addressed Today: Depression, major, single episode, mild (HCC) She has developed side effects with higher dose of Wellbutrin  including headache, nausea, jaw clenching, and cramps.  We will go back to 150 mg daily.  She previously tolerated this dose well without side effects.  She will follow-up with us  in a week or 2.  If side effects do not resolve with lower dose we will wean off this completely over the next few weeks.  She did ask about augmenting medications.  Would be reasonable for us  to add on a medication such as SSRI depending on response to above however discussed with patient we should work on weaning down Wellbutrin  first to help mitigate side effects before we add on any other medications to prevent confounding.  She is agreeable to this plan.  She will follow-up in a few weeks as above.  Consider addition of SSRI at some point in the near future if needed.  Meniere's disease of both ears Will refill meclizine .  She has done well with this.  Elevated blood pressure reading Initially elevated however at goal on recheck.  She has had intermittent elevations the last several months.  We did discuss lifestyle modifications including low-sodium diet and regular exercise.  Advised her to check routinely at home with goal of 140/90 or lower.  She will follow-up with us  in a few weeks via MyChart with her home readings.     Subjective:  HPI:  See A/P for status of chronic conditions.  Patient is here today for follow-up.  I last saw her about a month ago.  At that time we continued her on Wellbutrin  150 mg daily for depression as well as nicotine  dependence.  She did feel like it helped with reducing smoking however had ongoing issues with irritability and the dose was increased to 300 mg a few weeks afterwards.  She has recently developed side effects including headache,  cramps, nausea, and jaw clenching.  She does continue with this dose of Wellbutrin .  She would like to decrease the dose of Wellbutrin .  She does think that she now remembers having a similar reaction of this about 30 years ago.       Objective:  Physical Exam: BP 137/79   Pulse 74   Temp (!) 97 F (36.1 C) (Temporal)   Ht 5\' 8"  (1.727 m)   Wt 205 lb 6.4 oz (93.2 kg)   SpO2 96%   BMI 31.23 kg/m   Wt Readings from Last 3 Encounters:  06/02/23 205 lb 6.4 oz (93.2 kg)  04/24/23 201 lb (91.2 kg)  04/10/23 205 lb (93 kg)    Gen: No acute distress, resting comfortably Neuro: Grossly normal, moves all extremities Psych: Normal affect and thought content      Jeanne Diefendorf M. Daneil Dunker, MD 06/02/2023 2:51 PM

## 2023-06-02 NOTE — Assessment & Plan Note (Signed)
 She has developed side effects with higher dose of Wellbutrin  including headache, nausea, jaw clenching, and cramps.  We will go back to 150 mg daily.  She previously tolerated this dose well without side effects.  She will follow-up with us  in a week or 2.  If side effects do not resolve with lower dose we will wean off this completely over the next few weeks.  She did ask about augmenting medications.  Would be reasonable for us  to add on a medication such as SSRI depending on response to above however discussed with patient we should work on weaning down Wellbutrin  first to help mitigate side effects before we add on any other medications to prevent confounding.  She is agreeable to this plan.  She will follow-up in a few weeks as above.  Consider addition of SSRI at some point in the near future if needed.

## 2023-06-02 NOTE — Patient Instructions (Signed)
 It was very nice to see you today!  Please decrease your Wellbutrin  150 mg daily.  Please follow-up with me in a few weeks to let me know how you are doing.  I will refill your meclizine  today.  Please monitor your blood pressure at home and follow-up with me in a few weeks to let us  know how you are doing.  Return if symptoms worsen or fail to improve.   Take care, Dr Daneil Dunker  PLEASE NOTE:  If you had any lab tests, please let us  know if you have not heard back within a few days. You may see your results on mychart before we have a chance to review them but we will give you a call once they are reviewed by us .   If we ordered any referrals today, please let us  know if you have not heard from their office within the next week.   If you had any urgent prescriptions sent in today, please check with the pharmacy within an hour of our visit to make sure the prescription was transmitted appropriately.   Please try these tips to maintain a healthy lifestyle:  Eat at least 3 REAL meals and 1-2 snacks per day.  Aim for no more than 5 hours between eating.  If you eat breakfast, please do so within one hour of getting up.   Each meal should contain half fruits/vegetables, one quarter protein, and one quarter carbs (no bigger than a computer mouse)  Cut down on sweet beverages. This includes juice, soda, and sweet tea.   Drink at least 1 glass of water with each meal and aim for at least 8 glasses per day  Exercise at least 150 minutes every week.  She knows her blood

## 2023-06-02 NOTE — Assessment & Plan Note (Signed)
 Initially elevated however at goal on recheck.  She has had intermittent elevations the last several months.  We did discuss lifestyle modifications including low-sodium diet and regular exercise.  Advised her to check routinely at home with goal of 140/90 or lower.  She will follow-up with us  in a few weeks via MyChart with her home readings.

## 2023-06-03 ENCOUNTER — Other Ambulatory Visit: Payer: Self-pay | Admitting: *Deleted

## 2023-06-03 ENCOUNTER — Encounter: Payer: Self-pay | Admitting: Family Medicine

## 2023-06-03 NOTE — Telephone Encounter (Signed)
 Noted.

## 2023-06-10 NOTE — Progress Notes (Signed)
 [name] [address] [place] [address] Phone: [phone] Subjective:   Kimberly [name], am serving as a scribe for Dr. Ronnell Coins.  I'm seeing this patient by the request  of:  Rodney Clamp, MD  CC: Knee pain, back pain and neck pain follow-up  NGE:[id number]  Kimberly [name] is a 73 y.o. female coming in with complaint of back and neck pain. OMT on 04/10/2023. Patient states that she feels like she has a Baker's Cyst in L knee. B knee pain at night.   Medications patient has been prescribed:   Taking:         Reviewed prior external information including notes and imaging from previsou exam, outside providers and external EMR if available.   As well as notes that were available from care everywhere and other healthcare systems.  Past medical history, social, surgical and family history all reviewed in electronic medical record.  No pertanent information unless stated regarding to the chief complaint.   Past Medical History:  Diagnosis Date   Arthritis    Cataract    Diverticulosis    Meniere's disease, bilateral    Osteopenia     Allergies  Allergen Reactions   Erythromycin Diarrhea   Clindamycin/Lincomycin Nausea And Vomiting   Morphine And Codeine Itching   Latex Rash     Review of Systems:  No headache, visual changes, nausea, vomiting, diarrhea, constipation, dizziness, abdominal pain, skin rash, fevers, chills, night sweats, weight loss, swollen lymph nodes, body aches, joint swelling, chest pain, shortness of breath, mood changes. POSITIVE muscle aches  Objective  Blood pressure 126/78, pulse 87, height 5\' 8"  (1.727 m), weight 206 lb (93.4 kg), SpO2 97%.   General: No apparent distress alert and oriented x3 mood and affect normal, dressed appropriately.  HEENT: Pupils equal, extraocular movements intact  Respiratory: Patient's speak in full sentences and does not appear short of breath  Cardiovascular: No  lower extremity edema, non tender, no erythema  Gait MSK:  Back does have some loss lordosis.  Does have some limited range of motion secondary to tightness.  Weakness with hip adductors noted.  Neck exam does have some loss of lordosis as well.  Limited sidebending   Limited muscular skeletal ultrasound was performed and interpreted by Ronnell Coins, M  Limited ultrasound shows no recurrent of the Baker's cyst on the right knee.  Underlying arthritic changes still noted.   Osteopathic findings  C2 flexed rotated and side bent right C7 flexed rotated and side bent left T3 extended rotated and side bent right inhaled rib T6 extended rotated and side bent left L1 flexed rotated and side bent right Sacrum right on right       Assessment and Plan:  Low back pain potentially associated with spinal stenosis Continues to respond relatively well to osteopathic manipulation.  Patient will be traveling for a long duration.  We discussed doing some of the range of motion and home exercises.  Follow-up again in 6 to 8 weeks  Baker's cyst of knee, right No reaccumulation at the moment.  Has the underlying arthritis that we will continue to monitor.  Degenerative arthritis of knee, bilateral Arthritic changes noted     Nonallopathic problems  Decision today to treat with OMT was based on Physical Exam  After verbal consent patient was treated with HVLA, ME, FPR techniques in cervical, rib, thoracic, lumbar, and sacral  areas  Patient tolerated the procedure well with improvement in  symptoms  Patient given exercises, stretches and lifestyle modifications  See medications in patient instructions if given  Patient will follow up in 4-8 weeks    The above documentation has been reviewed and is accurate and complete Kimberly [name] M Kimberly Sanders, DO          Note: This dictation was prepared with Dragon dictation along with smaller phrase technology. Any transcriptional errors that result  from this process are unintentional.

## 2023-06-11 ENCOUNTER — Other Ambulatory Visit: Payer: Self-pay

## 2023-06-11 ENCOUNTER — Ambulatory Visit: Admitting: Family Medicine

## 2023-06-11 VITALS — BP 126/78 | HR 87 | Ht 68.0 in | Wt 206.0 lb

## 2023-06-11 DIAGNOSIS — G8929 Other chronic pain: Secondary | ICD-10-CM

## 2023-06-11 DIAGNOSIS — M9908 Segmental and somatic dysfunction of rib cage: Secondary | ICD-10-CM

## 2023-06-11 DIAGNOSIS — M9902 Segmental and somatic dysfunction of thoracic region: Secondary | ICD-10-CM | POA: Diagnosis not present

## 2023-06-11 DIAGNOSIS — M25562 Pain in left knee: Secondary | ICD-10-CM | POA: Diagnosis not present

## 2023-06-11 DIAGNOSIS — M9901 Segmental and somatic dysfunction of cervical region: Secondary | ICD-10-CM | POA: Diagnosis not present

## 2023-06-11 DIAGNOSIS — M9903 Segmental and somatic dysfunction of lumbar region: Secondary | ICD-10-CM | POA: Diagnosis not present

## 2023-06-11 DIAGNOSIS — M9904 Segmental and somatic dysfunction of sacral region: Secondary | ICD-10-CM | POA: Diagnosis not present

## 2023-06-11 DIAGNOSIS — M170 Bilateral primary osteoarthritis of knee: Secondary | ICD-10-CM | POA: Diagnosis not present

## 2023-06-11 DIAGNOSIS — M7121 Synovial cyst of popliteal space [Baker], right knee: Secondary | ICD-10-CM

## 2023-06-11 DIAGNOSIS — M545 Low back pain, unspecified: Secondary | ICD-10-CM

## 2023-06-11 MED ORDER — PREDNISONE 20 MG PO TABS: 20.0000 mg | ORAL_TABLET | Freq: Every day | ORAL | 0 refills | Status: AC

## 2023-06-11 MED ORDER — GABAPENTIN 100 MG PO CAPS: 200.0000 mg | ORAL_CAPSULE | Freq: Every day | ORAL | 0 refills | Status: DC

## 2023-06-11 NOTE — Patient Instructions (Signed)
 See me again in 2 months

## 2023-06-12 ENCOUNTER — Encounter: Payer: Self-pay | Admitting: Family Medicine

## 2023-06-12 NOTE — Assessment & Plan Note (Signed)
 Continues to respond relatively well to osteopathic manipulation.  Patient will be traveling for a long duration.  We discussed doing some of the range of motion and home exercises.  Follow-up again in 6 to 8 weeks

## 2023-06-12 NOTE — Assessment & Plan Note (Addendum)
 No reaccumulation at the moment.  Has the underlying arthritis that we will continue to monitor.  Prednisone  20 mg given for 7 days in case she needs it for her vacations.

## 2023-06-12 NOTE — Assessment & Plan Note (Signed)
Arthritic changes noted

## 2023-06-17 ENCOUNTER — Encounter: Payer: Self-pay | Admitting: Family Medicine

## 2023-07-02 ENCOUNTER — Encounter: Payer: Self-pay | Admitting: Family Medicine

## 2023-07-09 ENCOUNTER — Ambulatory Visit (INDEPENDENT_AMBULATORY_CARE_PROVIDER_SITE_OTHER)

## 2023-07-09 VITALS — Ht 68.0 in | Wt 206.0 lb

## 2023-07-09 DIAGNOSIS — Z0000 Encounter for general adult medical examination without abnormal findings: Secondary | ICD-10-CM

## 2023-07-09 DIAGNOSIS — E2839 Other primary ovarian failure: Secondary | ICD-10-CM | POA: Diagnosis not present

## 2023-07-09 NOTE — Patient Instructions (Addendum)
 Ms. Floor , Thank you for taking time out of your busy schedule to complete your Annual Wellness Visit with me. I enjoyed our conversation and look forward to speaking with you again next year. I, as well as your care team,  appreciate your ongoing commitment to your health goals. Please review the following plan we discussed and let me know if I can assist you in the future. Your Game plan/ To Do List    Referrals: If you haven't heard from the office you've been referred to, please reach out to them at the phone provided.   Follow up Visits: Next Medicare AWV with our clinical staff: 07/19/24   Have you seen your provider in the last 6 months (3 months if uncontrolled diabetes)? Yes Next Office Visit with your provider: not at this time   Clinician Recommendations:  Aim for 30 minutes of exercise or brisk walking, 6-8 glasses of water, and 5 servings of fruits and vegetables each day.   If you wish to quit smoking, help is available. For free tobacco cessation program offerings call the [facility] at 801-576-4495 or Live Well Line at 248 204 2232. You may also visit www.[place].com or email livelifewell@[place] .com for more information on other programs.   You may also call 1-800-QUIT-NOW (346-675-0637) or visit www.[place].ch or www.B[facility]ome[facility].org for additional resources on smoking cessation.    You have an order for:  []   2D Mammogram  []   3D Mammogram  [x]   Bone Density     Please call for appointment:  [facility] [address] [place], [place] [address] [phone]        This is a list of the screening recommended for you and due dates:  Health Maintenance  Topic Date Due   DEXA scan (bone density measurement)  12/07/2022   Medicare Annual Wellness Visit  12/19/2022   Flu Shot  09/12/2023   Mammogram  12/24/2023   Colon Cancer Screening  11/18/2024   DTaP/Tdap/Td vaccine (3 - Td or Tdap) 12/19/2031   Pneumonia  Vaccine  Completed   COVID-19 Vaccine  Completed   Hepatitis C Screening  Completed   Zoster (Shingles) Vaccine  Completed   HPV Vaccine  Aged Out   Meningitis B Vaccine  Aged Out    Advanced directives: (Copy Requested) Please bring a copy of your health care power of attorney and living will to the office to be added to your chart at your convenience. You can mail to [facility] [address] [place], [place] [address] or email to [email or web address] Advance Care Planning is important because it:  [x]  Makes sure you receive the medical care that is consistent with your values, goals, and preferences  [x]  It provides guidance to your family and loved ones and reduces their decisional burden about whether or not they are making the right decisions based on your wishes.  Follow the link provided in your after visit summary or read over the paperwork we have mailed to you to help you started getting your Advance Directives in place. If you need assistance in completing these, please reach out to us  so that we can help you!  See attachments for Preventive Care and Fall Prevention Tips.

## 2023-07-09 NOTE — Progress Notes (Signed)
 Subjective:   Kimberly [name] is a 73 y.o. who presents for a Medicare Wellness preventive visit.  As a reminder, Annual Wellness Visits don't include a physical exam, and some assessments may be limited, especially if this visit is performed virtually. We may recommend an in-person follow-up visit with your provider if needed.  Visit Complete: Virtual I connected with  Kimberly [name] on 07/09/23 by a video and audio enabled telemedicine application and verified that I am speaking with the correct person using two identifiers.  Patient Location: Home  Provider Location: Home Office  I discussed the limitations of evaluation and management by telemedicine. The patient expressed understanding and agreed to proceed.  Vital Signs: Because this visit was a virtual/telehealth visit, some criteria may be missing or patient reported. Any vitals not documented were not able to be obtained and vitals that have been documented are patient reported.  VideoDeclined- This patient declined Librarian, academic. Therefore the visit was completed with audio only.  Persons Participating in Visit: Patient.  AWV Questionnaire: Yes: Patient Medicare AWV questionnaire was completed by the patient on 07/05/23; I have confirmed that all information answered by patient is correct and no changes since this date.  Cardiac Risk Factors include: advanced age (>53men, >11 women);dyslipidemia;obesity (BMI >30kg/m2)     Objective:     Today's Vitals   07/09/23 1207  Weight: 206 lb (93.4 kg)  Height: 5\' 8"  (1.727 m)   Body mass index is 31.32 kg/m.     07/09/2023   12:12 PM 04/16/2020    7:12 AM 10/13/2019    9:52 PM  Advanced Directives  Does Patient Have a Medical Advance Directive? Yes No No  Type of Estate agent of [place];Living will    Copy of Healthcare Power of Attorney in Chart? No - copy requested    Would patient like information on creating a medical  advance directive?  No - Patient declined No - Patient declined    Current Medications (verified) Outpatient Encounter Medications as of 07/09/2023  Medication Sig   azelastine  (ASTELIN ) 0.1 % nasal spray Place 2 sprays into both nostrils 2 (two) times daily.   benzonatate  (TESSALON ) 200 MG capsule Take 1 capsule (200 mg total) by mouth 2 (two) times daily as needed for cough.   buPROPion  (WELLBUTRIN  XL) 150 MG 24 hr tablet Take 1 tablet (150 mg total) by mouth daily.   Calcium  Carbonate-Vit D-Min (CALCIUM  1200 PO) Take by mouth daily.   cetirizine (ZYRTEC) 10 MG tablet Take 10 mg by mouth daily.   Cholecalciferol (VITAMIN D3) 1000 units CAPS Take by mouth.   cyanocobalamin  (VITAMIN B12) 1000 MCG tablet Take 1,000 mcg by mouth daily.   gabapentin  (NEURONTIN ) 100 MG capsule Take 2 capsules (200 mg total) by mouth at bedtime.   meclizine  (ANTIVERT ) 25 MG tablet Take 1 tablet (25 mg total) by mouth 3 (three) times daily as needed for dizziness.   Multiple Vitamins-Minerals (HAIR SKIN AND NAILS FORMULA PO) Take by mouth.   Multiple Vitamins-Minerals (WOMENS 50+ MULTI VITAMIN PO) Take by mouth.   predniSONE  (DELTASONE ) 20 MG tablet Take 1 tablet (20 mg total) by mouth daily with breakfast.   rosuvastatin  (CRESTOR ) 5 MG tablet TAKE 1 TABLET BY MOUTH DAILY   vitamin E 180 MG (400 UNITS) capsule Take 400 Units by mouth daily.   No facility-administered encounter medications on file as of 07/09/2023.    Allergies (verified) Erythromycin, Clindamycin/lincomycin, Morphine and codeine, and Latex  History: Past Medical History:  Diagnosis Date   Arthritis    Cataract    Diverticulosis    Meniere's disease, bilateral    Osteopenia    Past Surgical History:  Procedure Laterality Date   ABDOMINAL HYSTERECTOMY     CHOLECYSTECTOMY     EYE SURGERY     age 57   THYROIDECTOMY, PARTIAL     Family History  Problem Relation Age of Onset   Colon polyps Mother    Heart disease Mother    Heart  disease Father    Stomach cancer Paternal Grandfather    Social History   Socioeconomic History   Marital status: Single    Spouse name: Not on file   Number of children: Not on file   Years of education: Not on file   Highest education level: Bachelor's degree (e.g., BA, AB, BS)  Occupational History   Occupation: Therapist, art  Tobacco Use   Smoking status: Every Day    Current packs/day: 1.00    Types: Cigarettes   Smokeless tobacco: Never  Vaping Use   Vaping status: Never Used  Substance and Sexual Activity   Alcohol use: Yes    Alcohol/week: 21.0 standard drinks of alcohol    Types: 21 Glasses of wine per week    Comment: 3 glasses of wine per day   Drug use: Never   Sexual activity: Not Currently  Other Topics Concern   Not on file  Social History Narrative   Not on file   Social Drivers of Health   Financial Resource Strain: Low Risk  (07/09/2023)   Overall Financial Resource Strain (CARDIA)    Difficulty of Paying Living Expenses: Not hard at all  Food Insecurity: No Food Insecurity (07/09/2023)   Hunger Vital Sign    Worried About Running Out of Food in the Last Year: Never true    Ran Out of Food in the Last Year: Never true  Transportation Needs: No Transportation Needs (07/09/2023)   PRAPARE - Administrator, Civil Service (Medical): No    Lack of Transportation (Non-Medical): No  Physical Activity: Inactive (07/09/2023)   Exercise Vital Sign    Days of Exercise per Week: 0 days    Minutes of Exercise per Session: 0 min  Stress: No Stress Concern Present (07/09/2023)   [employer] of Occupational Health - Occupational Stress Questionnaire    Feeling of Stress : Only a little  Social Connections: Moderately Isolated (07/09/2023)   Social Connection and Isolation Panel [NHANES]    Frequency of Communication with Friends and Family: Three times a week    Frequency of Social Gatherings with Friends and Family: Three times a week    Attends  Religious Services: Never    Active Member of Clubs or Organizations: Yes    Attends Banker Meetings: 1 to 4 times per year    Marital Status: Never married    Tobacco Counseling Ready to quit: Not Answered Counseling given: Not Answered    Clinical Intake:  Pre-visit preparation completed: Yes  Pain : No/denies pain     BMI - recorded: 31.32 Nutritional Status: BMI > 30  Obese Nutritional Risks: None Diabetes: No  Lab Results  Component Value Date   HGBA1C 5.7 03/06/2023   HGBA1C 5.6 12/18/2021   HGBA1C 5.1 09/01/2019     How often do you need to have someone help you when you read instructions, pamphlets, or other written materials from your doctor or pharmacy?:  1 - Never  Interpreter Needed?: No  Information entered by :: Lamont Pilsner, LPN   Activities of Daily Living     07/05/2023    1:04 PM  In your present state of health, do you have any difficulty performing the following activities:  Hearing? 1  Comment slight HOH  Vision? 0  Difficulty concentrating or making decisions? 0  Walking or climbing stairs? 0  Dressing or bathing? 0  Doing errands, shopping? 0  Preparing Food and eating ? N  Using the Toilet? N  In the past six months, have you accidently leaked urine? Y  Comment wears a pad at times  Do you have problems with loss of bowel control? N  Managing your Medications? N  Managing your Finances? N  Housekeeping or managing your Housekeeping? N    Patient Care Team: Rodney Clamp, MD as PCP - General (Family Medicine)  Indicate any recent Medical Services you may have received from other than [facility] providers in the past year (date may be approximate).     Assessment:    This is a routine wellness examination for Marisah.  Hearing/Vision screen Hearing Screening - Comments:: Pt stated HOH  Vision Screening - Comments:: Wears rx glasses - up to date with routine eye exams with [place] [facility] Dr Pauline Bos      Goals Addressed             This Visit's Progress    Patient Stated       If you wish to quit smoking, help is available. For free tobacco cessation program offerings call the [facility] at 682 257 4099 or Live Well Line at (531)312-1028. You may also visit [email or web address] or email livelifewell@ [email or web address] for more information on other programs.   You may also call 1-800-QUIT-NOW (309-335-9238) or visit www.[place].ch or www.[email or web address] for additional resources on smoking cessation.         Depression Screen     07/09/2023   12:11 PM 06/02/2023    2:51 PM 04/24/2023    8:46 AM 03/06/2023   11:35 AM 12/18/2021    9:58 AM 12/11/2020    9:01 AM 05/11/2020    1:11 PM  PHQ 2/9 Scores  PHQ - 2 Score 1 4 0 6 0 0 0  PHQ- 9 Score 1 17 0 19       Fall Risk     07/05/2023    1:04 PM 06/02/2023    2:14 PM 04/24/2023    8:46 AM 03/06/2023   11:35 AM 12/18/2021    9:58 AM  Fall Risk   Falls in the past year? 0 0 0 1 1  Number falls in past yr: 0 0 0 1 1  Injury with Fall? 0 0 0 0 1  Risk for fall due to : No Fall Risks No Fall Risks No Fall Risks No Fall Risks No Fall Risks  Follow up Falls prevention discussed        MEDICARE RISK AT HOME:  Medicare Risk at Home Any stairs in or around the home?: (Patient-Rptd) No If so, are there any without handrails?: (Patient-Rptd) No Home free of loose throw rugs in walkways, pet beds, electrical cords, etc?: (Patient-Rptd) Yes Adequate lighting in your home to reduce risk of falls?: (Patient-Rptd) Yes Life alert?: (Patient-Rptd) No Use of a cane, walker or w/c?: (Patient-Rptd) No Grab bars in the bathroom?: (Patient-Rptd) Yes Shower chair or bench in shower?: (Patient-Rptd) No Elevated toilet seat or a handicapped  toilet?: (Patient-Rptd) Yes  TIMED UP AND GO:  Was the test performed?  No  Cognitive Function: 6CIT completed        07/09/2023   12:14 PM  6CIT Screen  What Year? 0 points  What  month? 0 points  What time? 0 points  Count back from 20 0 points  Months in reverse 0 points  Repeat phrase 0 points  Total Score 0 points    Immunizations Immunization History  Administered Date(s) Administered   Fluad Quad(high Dose 65+) 10/05/2018, 12/02/2019, 09/23/2021   Influenza, High Dose Seasonal PF 11/29/2016, 12/02/2017, 09/26/2022   Influenza-Unspecified 11/20/2020, 09/23/2021   Moderna Covid-19 Fall Seasonal Vaccine 68yrs & older 12/19/2021, 05/04/2022   PFIZER(Purple Top)SARS-COV-2 Vaccination 03/05/2019, 03/25/2019, 10/04/2019, 05/18/2020   Pfizer Covid-19 Vaccine Bivalent Booster 53yrs & up 11/20/2020, 09/23/2021   Pfizer(Comirnaty)Fall Seasonal Vaccine 12 years and older 12/18/2021, 10/08/2022   Pneumococcal Conjugate-13 08/06/2016   Pneumococcal Polysaccharide-23 09/01/2019   Respiratory Syncytial Virus Vaccine,Recomb Aduvanted(Arexvy) 10/19/2021   Rsv, Bivalent, Protein Subunit Rsvpref,pf Pattricia Bores) 10/19/2021   Tdap 08/07/2011, 12/18/2021   Zoster Recombinant(Shingrix) 06/06/2017, 12/01/2017   Zoster, Live 08/07/2011    Screening Tests Health Maintenance  Topic Date Due   DEXA SCAN  12/07/2022   INFLUENZA VACCINE  09/12/2023   MAMMOGRAM  12/24/2023   Medicare Annual Wellness (AWV)  07/08/2024   Colonoscopy  11/18/2024   DTaP/Tdap/Td (3 - Td or Tdap) 12/19/2031   Pneumonia Vaccine 75+ Years old  Completed   COVID-19 Vaccine  Completed   Hepatitis C Screening  Completed   Zoster Vaccines- Shingrix  Completed   HPV VACCINES  Aged Out   Meningococcal B Vaccine  Aged Out    Health Maintenance  Health Maintenance Due  Topic Date Due   DEXA SCAN  12/07/2022   Health Maintenance Items Addressed: See Nurse Notes  Additional Screening:  Vision Screening: Recommended annual ophthalmology exams for early detection of glaucoma and other disorders of the eye.  Dental Screening: Recommended annual dental exams for proper oral hygiene  Community Resource  Referral / Chronic Care Management: CRR required this visit?  No   CCM required this visit?  No   Plan:    I have personally reviewed and noted the following in the patient's chart:   Medical and social history Use of alcohol, tobacco or illicit drugs  Current medications and supplements including opioid prescriptions. Patient is not currently taking opioid prescriptions. Functional ability and status Nutritional status Physical activity Advanced directives List of other physicians Hospitalizations, surgeries, and ER visits in previous 12 months Vitals Screenings to include cognitive, depression, and falls Referrals and appointments  In addition, I have reviewed and discussed with patient certain preventive protocols, quality metrics, and best practice recommendations. A written personalized care plan for preventive services as well as general preventive health recommendations were provided to patient.   Bruno Capri, LPN   03/27/863   After Visit Summary: (MyChart) Due to this being a telephonic visit, the after visit summary with patients personalized plan was offered to patient via MyChart   Notes: Nothing significant to report at this time.

## 2023-08-04 ENCOUNTER — Other Ambulatory Visit: Payer: Self-pay | Admitting: Family Medicine

## 2023-08-07 NOTE — Progress Notes (Signed)
 [name] [address] [place] [address] Phone: [phone] Subjective:   Kimberly [name], am serving as a scribe for Dr. Arthea [name].  I'm seeing this patient by the request  of:  Kennyth Worth HERO, MD  CC: Back and neck pain follow-up, left knee pain  YEP:Dlagzrupcz  Kimberly [name] is a 73 y.o. female coming in with complaint of back and neck pain. OMT 06/11/2023. Also f/u for B knee pain. Patient states knees are about the same. Feet not hurting during the day, but at end of day when going to lay down feet hurt.  Medications patient has been prescribed: Prednisone , Gabapentin   Taking:         Reviewed prior external information including notes and imaging from previsou exam, outside providers and external EMR if available.   As well as notes that were available from care everywhere and other healthcare systems.  Past medical history, social, surgical and family history all reviewed in electronic medical record.  No pertanent information unless stated regarding to the chief complaint.   Past Medical History:  Diagnosis Date   Arthritis    Cataract    Diverticulosis    Meniere's disease, bilateral    Osteopenia     Allergies  Allergen Reactions   Erythromycin Diarrhea   Clindamycin/Lincomycin Nausea And Vomiting   Morphine And Codeine Itching   Latex Rash     Review of Systems:  No headache, visual changes, nausea, vomiting, diarrhea, constipation, dizziness, abdominal pain, skin rash, fevers, chills, night sweats, weight loss, swollen lymph nodes, body aches, joint swelling, chest pain, shortness of breath, mood changes. POSITIVE muscle aches  Objective  Blood pressure (!) 140/90, pulse 80, height 5' 8 (1.727 m), weight 204 lb (92.5 kg), SpO2 97%.   General: No apparent distress alert and oriented x3 mood and affect normal, dressed appropriately.  HEENT: Pupils equal, extraocular movements intact  Respiratory: Patient's  speak in full sentences and does not appear short of breath  Cardiovascular: No lower extremity edema, non tender, no erythema  Gait relatively normal MSK:  Back does have some loss of lordosis.  Patient does have some DIP tenderness in the sacroiliac joint bilaterally.  Right knee exam does not show any significant fullness in the popliteal area.  Known arthritic changes noted. Trace effusion of the patellofemoral joint  Limited muscular skeletal ultrasound was performed and interpreted by [name] Kimberly, M  Limited ultrasound of patient's right knee does show some arthritic changes noted.  Very small Baker's cyst noted in the popliteal area but nothing that would need intervention. Midfoot on the right foot also was asked to be addressed and only showed some mild inflammation of the midfoot.  Osteopathic findings  C2 flexed rotated and side bent right C6 flexed rotated and side bent left T3 extended rotated and side bent right inhaled rib T9 extended rotated and side bent left L2 flexed rotated and side bent right L3 flexed rotated and side bent left Sacrum right on right    Assessment and Plan:  No problem-specific Assessment & Plan notes found for this encounter.    Nonallopathic problems  Decision today to treat with OMT was based on Physical Exam  After verbal consent patient was treated with HVLA, ME, FPR techniques in cervical, rib, thoracic, lumbar, and sacral  areas  Patient tolerated the procedure well with improvement in symptoms  Patient given exercises, stretches and lifestyle modifications  See medications in patient instructions if  given  Patient will follow up in 4-8 weeks    The above documentation has been reviewed and is accurate and complete Kimberly [name] M Kimberly Rinck, DO          Note: This dictation was prepared with Dragon dictation along with smaller phrase technology. Any transcriptional errors that result from this process are unintentional.

## 2023-08-11 ENCOUNTER — Ambulatory Visit (INDEPENDENT_AMBULATORY_CARE_PROVIDER_SITE_OTHER): Admitting: Family Medicine

## 2023-08-11 ENCOUNTER — Encounter: Payer: Self-pay | Admitting: Family Medicine

## 2023-08-11 ENCOUNTER — Other Ambulatory Visit: Payer: Self-pay

## 2023-08-11 VITALS — BP 140/90 | HR 80 | Ht 68.0 in | Wt 204.0 lb

## 2023-08-11 DIAGNOSIS — M9902 Segmental and somatic dysfunction of thoracic region: Secondary | ICD-10-CM

## 2023-08-11 DIAGNOSIS — M7121 Synovial cyst of popliteal space [Baker], right knee: Secondary | ICD-10-CM | POA: Diagnosis not present

## 2023-08-11 DIAGNOSIS — M9908 Segmental and somatic dysfunction of rib cage: Secondary | ICD-10-CM | POA: Diagnosis not present

## 2023-08-11 DIAGNOSIS — M170 Bilateral primary osteoarthritis of knee: Secondary | ICD-10-CM

## 2023-08-11 DIAGNOSIS — M9901 Segmental and somatic dysfunction of cervical region: Secondary | ICD-10-CM

## 2023-08-11 DIAGNOSIS — M9904 Segmental and somatic dysfunction of sacral region: Secondary | ICD-10-CM

## 2023-08-11 DIAGNOSIS — M9903 Segmental and somatic dysfunction of lumbar region: Secondary | ICD-10-CM

## 2023-08-11 NOTE — Assessment & Plan Note (Signed)
 Discussed HEP  Discussed which activities to do and which ones to avoid.  Worsening pain will consider injection.  Has been sometime since we have done 1.

## 2023-08-11 NOTE — Patient Instructions (Addendum)
 Good to see you! Change the shoes up  I'll try to wear matching pants See you again in 2 months

## 2023-09-05 ENCOUNTER — Other Ambulatory Visit: Payer: Self-pay | Admitting: Family Medicine

## 2023-10-09 NOTE — Progress Notes (Signed)
 [name] [address] [place] [address] Phone: [phone] Subjective:   Kimberly [name], am serving as a scribe for Dr. Arthea Claudene.  I'm seeing this patient by the request  of:  Kennyth Worth HERO, MD  CC: Back and neck pain follow-up knee pain follow-up  Kimberly [name]  Kimberly [name] is a 73 y.o. female coming in with complaint of back and neck pain. OMT 08/11/2023. Also f/u for R knee pain.   Would like support in her slippers as her feet burn and tingle in bed all night.   Back is the same as last visit. No longer using gabapentin .   Medications patient has been prescribed: Gabapentin   Taking:         Reviewed prior external information including notes and imaging from previsou exam, outside providers and external EMR if available.   As well as notes that were available from care everywhere and other healthcare systems.  Past medical history, social, surgical and family history all reviewed in electronic medical record.  No pertanent information unless stated regarding to the chief complaint.   Past Medical History:  Diagnosis Date   Arthritis    Cataract    Diverticulosis    Meniere's disease, bilateral    Osteopenia     Allergies  Allergen Reactions   Erythromycin Diarrhea   Clindamycin/Lincomycin Nausea And Vomiting   Morphine And Codeine Itching   Latex Rash     Review of Systems:  No headache, visual changes, nausea, vomiting, diarrhea, constipation, dizziness, abdominal pain, skin rash, fevers, chills, night sweats, weight loss, swollen lymph nodes, body aches, joint swelling, chest pain, shortness of breath, mood changes. POSITIVE muscle aches  Objective  Blood pressure 118/82, pulse 70, height 5' 8 (1.727 m), weight 213 lb (96.6 kg), SpO2 97%.   General: No apparent distress alert and oriented x3 mood and affect normal, dressed appropriately.  HEENT: Pupils equal, extraocular movements intact  Respiratory:  Patient's speak in full sentences and does not appear short of breath  Cardiovascular: No lower extremity edema, non tender, no erythema  Gait normal  MSK:  Back does have some loss lordosis noted.  Osteopathic findings  C2 flexed rotated and side bent right C5 flexed rotated and side bent left T3 extended rotated and side bent right inhaled rib T5 extended rotated and side bent left T3 extended rotated sidebent left L2 flexed rotated and side bent right Sacrum right on right  Limited muscular skeletal ultrasound was performed and interpreted by CLAUDENE ARTHEA, M  Limited ultrasound shows some mild hypoechoic changes in the posterior aspect of the knee.  He does have some arthritic changes noted. Impression: Stable.     Assessment and Plan:  Low back pain potentially associated with spinal stenosis Continue to work on core strengthening.  Patient is going back down to [place]  where patient does do a lot more manual movements things like boxes at the moment.  This can cause an exacerbation.  Discussed which activities to do in which ones to increase activity slowly.  Follow-up again in 6 to 8 weeks otherwise.  Degenerative joint disease of knee, right Arthritic changes with a Baker's cyst of this knee.  Continue to monitor.  Hold on any other injection.  If worsening pain will consider it again now.    Nonallopathic problems  Decision today to treat with OMT was based on Physical Exam  After verbal consent patient was treated with HVLA, ME, FPR techniques  in cervical, rib, thoracic, lumbar, and sacral  areas  Patient tolerated the procedure well with improvement in symptoms  Patient given exercises, stretches and lifestyle modifications  See medications in patient instructions if given  Patient will follow up in 6-8 weeks     The above documentation has been reviewed and is accurate and complete Arthea CHRISTELLA Sharps, DO         Note: This dictation was prepared  with Dragon dictation along with smaller phrase technology. Any transcriptional errors that result from this process are unintentional.

## 2023-10-14 ENCOUNTER — Ambulatory Visit (INDEPENDENT_AMBULATORY_CARE_PROVIDER_SITE_OTHER): Admitting: Family Medicine

## 2023-10-14 ENCOUNTER — Encounter: Payer: Self-pay | Admitting: Family Medicine

## 2023-10-14 VITALS — BP 118/82 | HR 70 | Ht 68.0 in | Wt 213.0 lb

## 2023-10-14 DIAGNOSIS — M1711 Unilateral primary osteoarthritis, right knee: Secondary | ICD-10-CM

## 2023-10-14 DIAGNOSIS — M545 Low back pain, unspecified: Secondary | ICD-10-CM | POA: Diagnosis not present

## 2023-10-14 DIAGNOSIS — M9901 Segmental and somatic dysfunction of cervical region: Secondary | ICD-10-CM | POA: Diagnosis not present

## 2023-10-14 DIAGNOSIS — M9903 Segmental and somatic dysfunction of lumbar region: Secondary | ICD-10-CM

## 2023-10-14 DIAGNOSIS — M9902 Segmental and somatic dysfunction of thoracic region: Secondary | ICD-10-CM | POA: Diagnosis not present

## 2023-10-14 DIAGNOSIS — M9904 Segmental and somatic dysfunction of sacral region: Secondary | ICD-10-CM

## 2023-10-14 DIAGNOSIS — M9908 Segmental and somatic dysfunction of rib cage: Secondary | ICD-10-CM

## 2023-10-14 NOTE — Assessment & Plan Note (Signed)
 Continue to work on core strengthening.  Patient is going back down to [place]  where patient does do a lot more manual movements things like boxes at the moment.  This can cause an exacerbation.  Discussed which activities to do in which ones to increase activity slowly.  Follow-up again in 6 to 8 weeks otherwise.

## 2023-10-14 NOTE — Patient Instructions (Addendum)
 Metatarsal pads for sandals See you again in 5-7 weeks

## 2023-10-14 NOTE — Assessment & Plan Note (Signed)
 Arthritic changes with a Baker's cyst of this knee.  Continue to monitor.  Hold on any other injection.  If worsening pain will consider it again now.

## 2023-11-19 NOTE — Progress Notes (Unsigned)
  [name] [address] [place] [address] Phone: [phone] Subjective:   LILLETTE Berwyn Posey, am serving as a scribe for Dr. Arthea Claudene.  I'm seeing this patient by the request  of:  Kennyth Worth HERO, MD  CC: Low back pain  YEP:Dlagzrupcz  ARNOLD DEPINTO is a 73 y.o. female coming in with complaint of back and neck pain. OMT 10/14/2023. Also f/u for R knee pain. Patient states that her knees are bothering her. L knee is worse than R knee. Wearing liner and orthotic in shoes as they are too big. Feet are painful at end of day when she lays down.   Back is the same as last visit.   Medications patient has been prescribed: Gabapentin   Taking:         Reviewed prior external information including notes and imaging from previsou exam, outside providers and external EMR if available.   As well as notes that were available from care everywhere and other healthcare systems.  Past medical history, social, surgical and family history all reviewed in electronic medical record.  No pertanent information unless stated regarding to the chief complaint.   Past Medical History:  Diagnosis Date   Arthritis    Cataract    Diverticulosis    Meniere's disease, bilateral    Osteopenia     Allergies  Allergen Reactions   Erythromycin Diarrhea   Clindamycin/Lincomycin Nausea And Vomiting   Morphine And Codeine Itching   Latex Rash     Review of Systems:  No headache, visual changes, nausea, vomiting, diarrhea, constipation, dizziness, abdominal pain, skin rash, fevers, chills, night sweats, weight loss, swollen lymph nodes, body aches, joint swelling, chest pain, shortness of breath, mood changes. POSITIVE muscle aches  Objective  Blood pressure 116/78, pulse 75, height 5' 8 (1.727 m), weight 200 lb (90.7 kg), SpO2 97%.   General: No apparent distress alert and oriented x3 mood and affect normal, dressed appropriately.  HEENT: Pupils equal,  extraocular movements intact  Respiratory: Patient's speak in full sentences and does not appear short of breath  Cardiovascular: No lower extremity edema, non tender, no erythema   Back because of the loss of lordosis noted.  Some tightness noted in all planes.  Osteopathic findings  C3 flexed rotated and side bent right C7 flexed rotated and side bent left T3 extended rotated and side bent right inhaled rib T8 extended rotated and side bent left L1 flexed rotated and side bent right Sacrum right on right    Assessment and Plan:  Low back pain potentially associated with spinal stenosis Low back does have loss of lordosis  Tightness noted  Discussed HEP  RTC in 8 weeks     Nonallopathic problems  Decision today to treat with OMT was based on Physical Exam  After verbal consent patient was treated with  ME, FPR techniques in cervical, rib, thoracic, lumbar, and sacral  areas  Patient tolerated the procedure well with improvement in symptoms  Patient given exercises, stretches and lifestyle modifications  See medications in patient instructions if given  Patient will follow up in 4-8 weeks      The above documentation has been reviewed and is accurate and complete Myka Lukins M Clyde Upshaw, DO        Note: This dictation was prepared with Dragon dictation along with smaller phrase technology. Any transcriptional errors that result from this process are unintentional.

## 2023-11-20 ENCOUNTER — Ambulatory Visit (INDEPENDENT_AMBULATORY_CARE_PROVIDER_SITE_OTHER): Admitting: Family Medicine

## 2023-11-20 ENCOUNTER — Encounter: Payer: Self-pay | Admitting: Family Medicine

## 2023-11-20 VITALS — BP 116/78 | HR 75 | Ht 68.0 in | Wt 200.0 lb

## 2023-11-20 DIAGNOSIS — M9903 Segmental and somatic dysfunction of lumbar region: Secondary | ICD-10-CM

## 2023-11-20 DIAGNOSIS — M9908 Segmental and somatic dysfunction of rib cage: Secondary | ICD-10-CM | POA: Diagnosis not present

## 2023-11-20 DIAGNOSIS — M9901 Segmental and somatic dysfunction of cervical region: Secondary | ICD-10-CM | POA: Diagnosis not present

## 2023-11-20 DIAGNOSIS — M9902 Segmental and somatic dysfunction of thoracic region: Secondary | ICD-10-CM

## 2023-11-20 DIAGNOSIS — M9904 Segmental and somatic dysfunction of sacral region: Secondary | ICD-10-CM | POA: Diagnosis not present

## 2023-11-20 DIAGNOSIS — M545 Low back pain, unspecified: Secondary | ICD-10-CM | POA: Diagnosis not present

## 2023-11-20 NOTE — Assessment & Plan Note (Signed)
 Low back does have loss of lordosis  Tightness noted  Discussed HEP  RTC in 8 weeks

## 2023-12-04 ENCOUNTER — Other Ambulatory Visit: Payer: Self-pay | Admitting: Family Medicine

## 2023-12-08 ENCOUNTER — Other Ambulatory Visit: Payer: Self-pay

## 2023-12-08 ENCOUNTER — Other Ambulatory Visit: Payer: Self-pay | Admitting: Family Medicine

## 2023-12-08 ENCOUNTER — Encounter: Payer: Self-pay | Admitting: Family Medicine

## 2023-12-08 DIAGNOSIS — Z1231 Encounter for screening mammogram for malignant neoplasm of breast: Secondary | ICD-10-CM

## 2023-12-08 DIAGNOSIS — M810 Age-related osteoporosis without current pathological fracture: Secondary | ICD-10-CM

## 2024-01-07 ENCOUNTER — Ambulatory Visit

## 2024-01-07 ENCOUNTER — Ambulatory Visit
Admission: RE | Admit: 2024-01-07 | Discharge: 2024-01-07 | Disposition: A | Discharge status: Home / Self Care | Source: Ambulatory Visit | Attending: Family Medicine | Admitting: Family Medicine

## 2024-01-07 DIAGNOSIS — Z1231 Encounter for screening mammogram for malignant neoplasm of breast: Secondary | ICD-10-CM

## 2024-01-12 ENCOUNTER — Ambulatory Visit

## 2024-01-12 ENCOUNTER — Ambulatory Visit: Admitting: Family Medicine

## 2024-01-12 VITALS — Ht 68.0 in | Wt 203.0 lb

## 2024-01-12 DIAGNOSIS — M8580 Other specified disorders of bone density and structure, unspecified site: Secondary | ICD-10-CM

## 2024-01-12 DIAGNOSIS — M25522 Pain in left elbow: Secondary | ICD-10-CM | POA: Diagnosis not present

## 2024-01-12 DIAGNOSIS — M545 Low back pain, unspecified: Secondary | ICD-10-CM

## 2024-01-12 DIAGNOSIS — M25511 Pain in right shoulder: Secondary | ICD-10-CM | POA: Diagnosis not present

## 2024-01-12 DIAGNOSIS — R0789 Other chest pain: Secondary | ICD-10-CM | POA: Diagnosis not present

## 2024-01-12 DIAGNOSIS — S52124A Nondisplaced fracture of head of right radius, initial encounter for closed fracture: Secondary | ICD-10-CM | POA: Insufficient documentation

## 2024-01-12 DIAGNOSIS — M810 Age-related osteoporosis without current pathological fracture: Secondary | ICD-10-CM

## 2024-01-12 DIAGNOSIS — M25521 Pain in right elbow: Secondary | ICD-10-CM

## 2024-01-12 NOTE — Progress Notes (Signed)
 LILLETTE Ileana Collet, PhD, LAT, ATC acting as a scribe for Artist Lloyd, MD.  Kimberly [name] is a 73 y.o. female who presents to [facility] today for back pain. Pt was last seen by Dr. Claudene on 11/20/23 for OMT.  Today, pt reports suffering a fall over the weekend, tripping on the curb at the grocery store. She thinks she landed on mostly her L side, but has pain in multiple locations. Pt locates pain to R elbow/distal humerus, R proximal humerus, L-sided rib cage w/ bruising over her L breast. She notes limited R elbow flexion and has had to use her L arm to feed herself.  Dx testing: 12/07/19 DEXA  Pertinent review of systems: No fevers or chills  Relevant historical information: Osteopenia   Exam:  Ht 5' 8 (1.727 m)   Wt 203 lb (92.1 kg)   BMI 30.87 kg/m  General: Well Developed, well nourished, and in no acute distress.   MSK: Right elbow some swelling normal-appearing otherwise.  Tender palpation radial head.  Decreased range of motion lacks full flexion.  Pain with resisted elbow extension and wrist extension.  Right shoulder normal appearing normal motion intact strength.  Pain with abduction.  Left elbow normal-appearing tender palpation posterior medial elbow.  Intact strength.  Left rib and chest wall tender palpation left lateral inferior chest wall.  L-spine nontender to palpation midline.  Decreased lumbar motion.    Lab and Radiology Results  X-ray images right shoulder, bilateral elbows, left rib and chest, and L-spine obtained today personally and independently interpreted.  Right shoulder: Mild degenerative changes no acute fractures.  Right elbow: Nondisplaced radial head fracture with joint effusion.  Left elbow no acute fractures small effusion.  Left rib: No pneumothorax or pleural effusion.  No large displaced rib fracture.  L-spine: Mild degenerative changes no acute fractures.  Await formal radiology  review  Assessment and Plan: 73 y.o. female with fall with multiple musculoskeletal pains.  Per my interpretation of x-rays patient does have a nondisplaced right radial head fracture.  Plan to treat with limited range of motion.  Offered a sling which she does not think that she needs which I think is reasonable.  Plan to reassess in about 2 weeks.  Additionally she has right shoulder and left elbow pain no acute fractures are visible.  She may have some sort of soft tissue tendon injury but does not have severe weakness.  Consider physical therapy in the future.  Left rib pain due to contusion.  No obvious fracture per my interpretation although radiology overread is still pending.  We talked about a rib binder and a bed assist lateral and deep inspiration every 30 minutes while awake.  L-spine pain due to contusion or strain no acute fractures visible per my read.  For pain management talked about Tylenol and naproxen.  Offered muscle relaxers patient declined.  Patient has a previously scheduled appointment with Dr. Claudene later this week for osteopathic manipulation.  Some manipulation still going to be okay but some will not.  Recommend recheck with myself or my partners in about 2 weeks.  Recommend physical therapy to start in about 3 weeks as well.  She wants to talk to Dr. Claudene about these plans which is just fine.  Additionally bone density test has been ordered by PCP and is scheduled for March 2026.  I do think we can probably get her in sooner at the [facility] office.  New  bone density test ordered.   PDMP not reviewed this encounter. Orders Placed This Encounter  Procedures   DG Ribs Unilateral W/Chest Left    Standing Status:   Future    Number of Occurrences:   1    Expiration Date:   02/12/2024    Reason for Exam (SYMPTOM  OR DIAGNOSIS REQUIRED):   rib pain    Preferred imaging location?:   [place]   DG ELBOW COMPLETE RIGHT (3+VIEW)    Standing Status:    Future    Number of Occurrences:   1    Expiration Date:   02/12/2024    Reason for Exam (SYMPTOM  OR DIAGNOSIS REQUIRED):   right elbow pain    Preferred imaging location?:   [place] [facility]   DG Lumbar Spine 2-3 Views    Standing Status:   Future    Number of Occurrences:   1    Expiration Date:   02/12/2024    Reason for Exam (SYMPTOM  OR DIAGNOSIS REQUIRED):   low back pain    Preferred imaging location?:   [facility]   DG Shoulder Right    Standing Status:   Future    Number of Occurrences:   1    Expiration Date:   01/11/2025    Reason for Exam (SYMPTOM  OR DIAGNOSIS REQUIRED):   right shoulder pain    Preferred imaging location?:   [place]   DG ELBOW COMPLETE LEFT (3+VIEW)    Standing Status:   Future    Number of Occurrences:   1    Expiration Date:   02/12/2024    Reason for Exam (SYMPTOM  OR DIAGNOSIS REQUIRED):   left elbow pain    Preferred imaging location?:   [place]   DG BONE DENSITY (DXA)    Standing Status:   Future    Expected Date:   01/26/2024    Expiration Date:   01/11/2025    Reason for Exam (SYMPTOM  OR DIAGNOSIS REQUIRED):   osteopenia    Preferred imaging location?:   [place]   No orders of the defined types were placed in this encounter.    Discussed warning signs or symptoms. Please see discharge instructions. Patient expresses understanding.   The above documentation has been reviewed and is accurate and complete Artist Lloyd, M.D.

## 2024-01-12 NOTE — Patient Instructions (Addendum)
 Thank you for coming in today.   Let me know about physical therapy  Schedule your bone density test at the check out desk before you leave today. And Cancel the test scheduled for March  Follow up with Dr. Claudene later this week  Check back in 2 weeks

## 2024-01-13 ENCOUNTER — Ambulatory Visit

## 2024-01-14 NOTE — Progress Notes (Unsigned)
 [name] [address] [place] [address] Phone: [phone] Subjective:   Kimberly [name], am serving as a scribe for Dr. Arthea Claudene.  I'm seeing this patient by the request  of:  Kennyth Worth HERO, MD  CC: back and neck pain follow up   YEP:Dlagzrupcz  LIRIO BACH is a 73 y.o. female coming in with complaint of back and neck pain. OMT 11/20/2023. Saw Dr. Joane for R radial head fx on 01/12/2024. Patient states that she has pain beneath the L breast with all movements.   R elbow continues to be painful.           Medications patient has been prescribed: Gabapentin   Taking:         Reviewed prior external information including notes and imaging from previsou exam, outside providers and external EMR if available.   As well as notes that were available from care everywhere and other healthcare systems.  Past medical history, social, surgical and family history all reviewed in electronic medical record.  No pertanent information unless stated regarding to the chief complaint.   Past Medical History:  Diagnosis Date   Arthritis    Cataract    Diverticulosis    Meniere's disease, bilateral    Osteopenia     Allergies  Allergen Reactions   Erythromycin Diarrhea   Clindamycin/Lincomycin Nausea And Vomiting   Morphine And Codeine Itching   Latex Rash     Review of Systems:  No headache, visual changes, nausea, vomiting, diarrhea, constipation, dizziness, abdominal pain, skin rash, fevers, chills, night sweats, weight loss, swollen lymph nodes, body aches, joint swelling, chest pain, shortness of breath, mood changes. POSITIVE muscle aches  Objective  Blood pressure 110/60, pulse 80, height 5' 8 (1.727 m), weight 206 lb (93.4 kg), SpO2 96%.   General: No apparent distress alert and oriented x3 mood and affect normal, dressed appropriately.  HEENT: Pupils equal, extraocular movements intact  Respiratory: Patient's speak  in full sentences and does not appear short of breath  Cardiovascular: No lower extremity edema, non tender, no erythema  Gait normal  MSK:  Back  Elbow exam shows of the right elbow does have some bruising noted.  Patient does have some limited range of motion.  Some difficulty with supination and pronation as well.  Swelling compared to the contralateral side Left wrist does have some bruising noted.  Severely tender to palpation at the mid nipple line at the eighth rib.  Limited muscular skeletal ultrasound was performed and interpreted by CLAUDENE ARTHEA, M  Limited ultrasound of the ribs shows that patient does have some bruising noted.  Hypoechoic changes consistent with this.  In addition to that patient does have cortical irregularity noted of the eighth rib and is consistent with a nondisplaced fracture. Impression: Nondisplaced rib fracture.      Assessment and Plan:  Left rib fracture Patient does have a rib fracture it appears seems to be at the T8 on the left side.  Nondisplaced.  Its the anterior aspect at the nipple line.  Patient told to take 10 deep breaths every hour.  Discussed with patient about potential lidocaine patches which patient declined.  Discussed tramadol for any breakthrough pain.  Follow-up with me again 2 weeks and maybe repeat x-rays of this as well as patient's radial head fracture.         The above documentation has been reviewed and is accurate and complete Anam Bobby M Donalda Job, DO  Note: This dictation was prepared with Dragon dictation along with smaller phrase technology. Any transcriptional errors that result from this process are unintentional.

## 2024-01-15 ENCOUNTER — Ambulatory Visit: Admitting: Family Medicine

## 2024-01-15 ENCOUNTER — Other Ambulatory Visit: Payer: Self-pay

## 2024-01-15 ENCOUNTER — Encounter: Payer: Self-pay | Admitting: Family Medicine

## 2024-01-15 VITALS — BP 110/60 | HR 80 | Ht 68.0 in | Wt 206.0 lb

## 2024-01-15 DIAGNOSIS — S2232XA Fracture of one rib, left side, initial encounter for closed fracture: Secondary | ICD-10-CM | POA: Insufficient documentation

## 2024-01-15 DIAGNOSIS — M79601 Pain in right arm: Secondary | ICD-10-CM

## 2024-01-15 DIAGNOSIS — S52124A Nondisplaced fracture of head of right radius, initial encounter for closed fracture: Secondary | ICD-10-CM

## 2024-01-15 MED ORDER — TRAMADOL HCL 50 MG PO TABS: 50.0000 mg | ORAL_TABLET | Freq: Two times a day (BID) | ORAL | 0 refills | Status: AC | PRN

## 2024-01-15 NOTE — Assessment & Plan Note (Signed)
 Radial head fracture.  Will continue with the conservative therapy and start range of motion exercises follow-up in 2 weeks to repeat x-rays to make sure no movement occurs and to start advancing appropriately.

## 2024-01-15 NOTE — Assessment & Plan Note (Signed)
 Patient does have a rib fracture it appears seems to be at the T8 on the left side.  Nondisplaced.  Its the anterior aspect at the nipple line.  Patient told to take 10 deep breaths every hour.  Discussed with patient about potential lidocaine patches which patient declined.  Discussed tramadol for any breakthrough pain.  Follow-up with me again 2 weeks and maybe repeat x-rays of this as well as patient's radial head fracture.

## 2024-01-16 IMAGING — DX DG FOOT COMPLETE 3+V*R*
3 series · 3 of 3 positions shown · non-contrast
Comparison: None.

CLINICAL DATA: Bilateral foot pain. Right foot pain worsening for
12 days after missing a step.

EXAM:
LEFT FOOT - COMPLETE 3+ VIEW; RIGHT FOOT COMPLETE - 3+ VIEW

[foot ap]
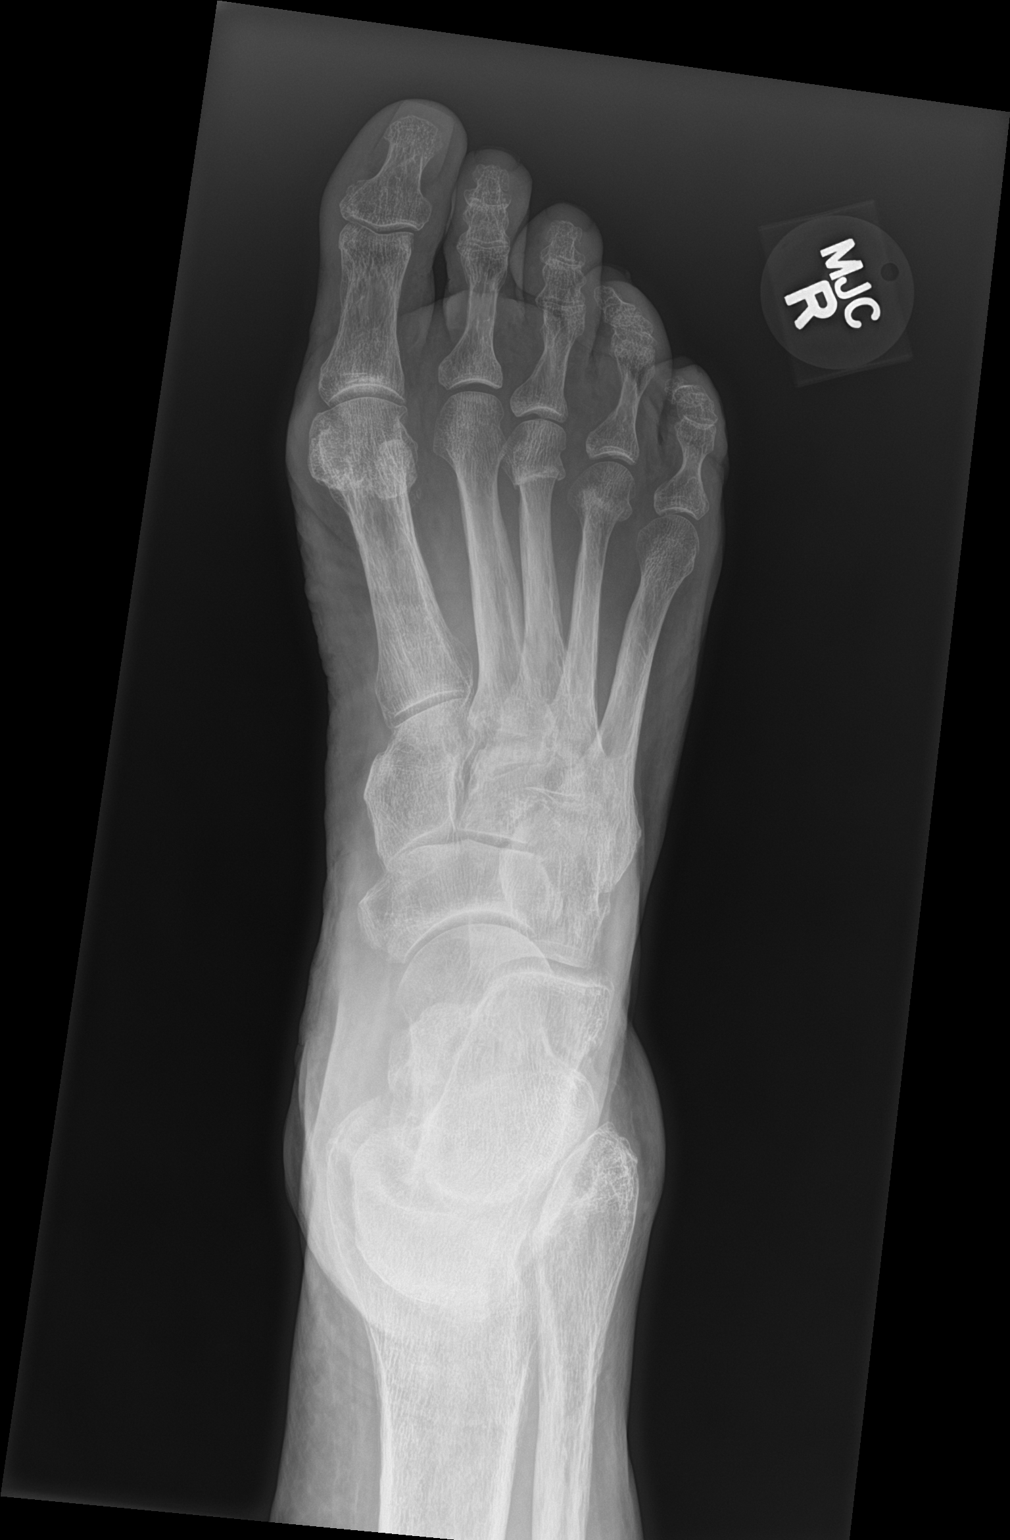

[foot obl]
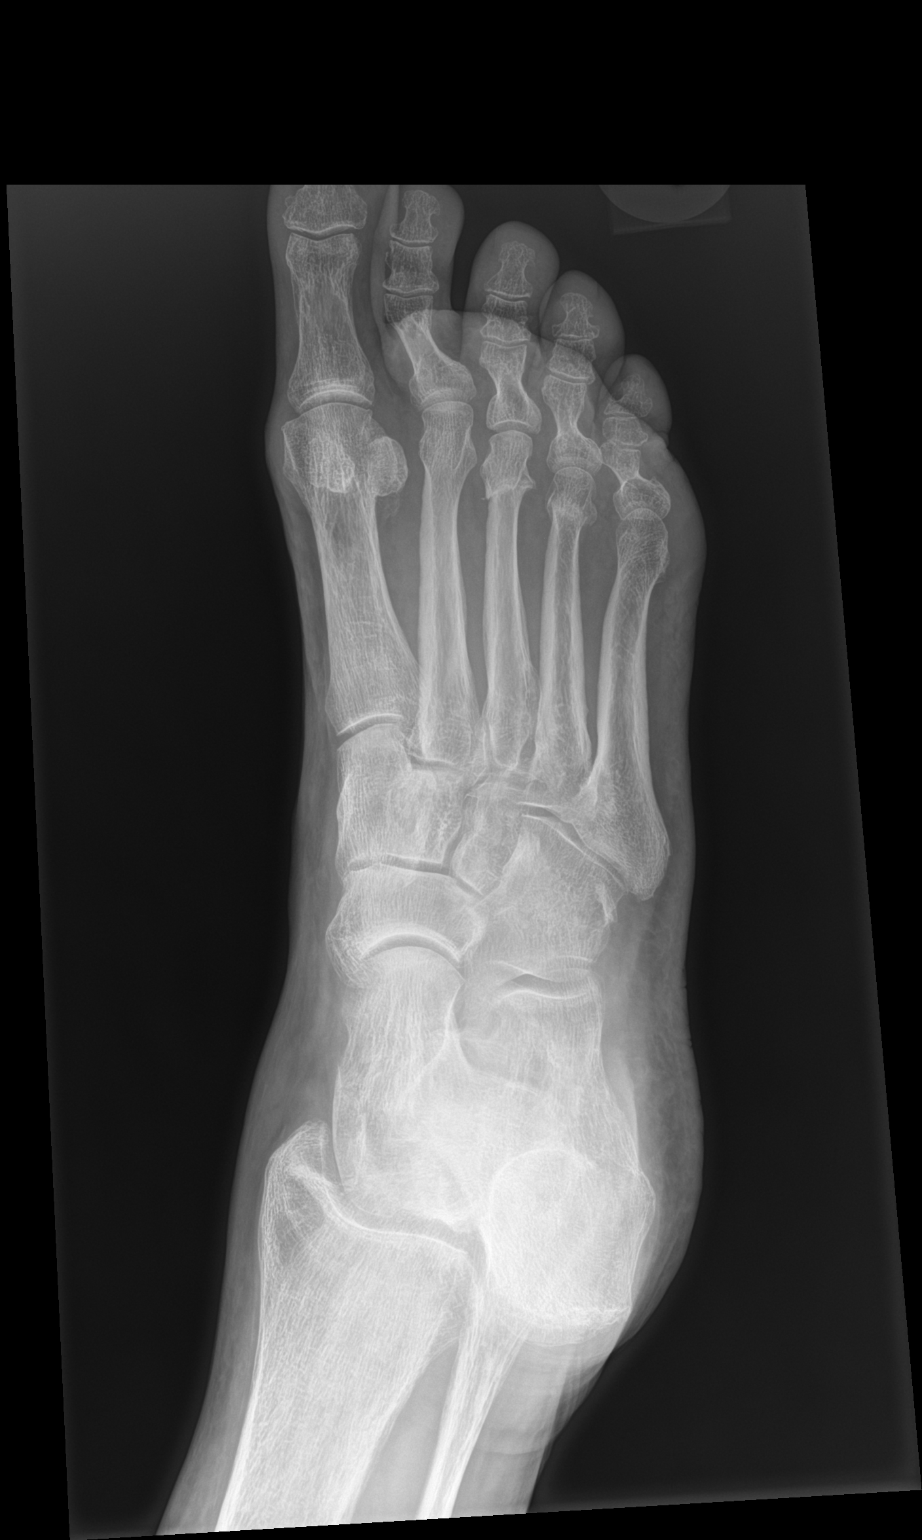

[foot lat]
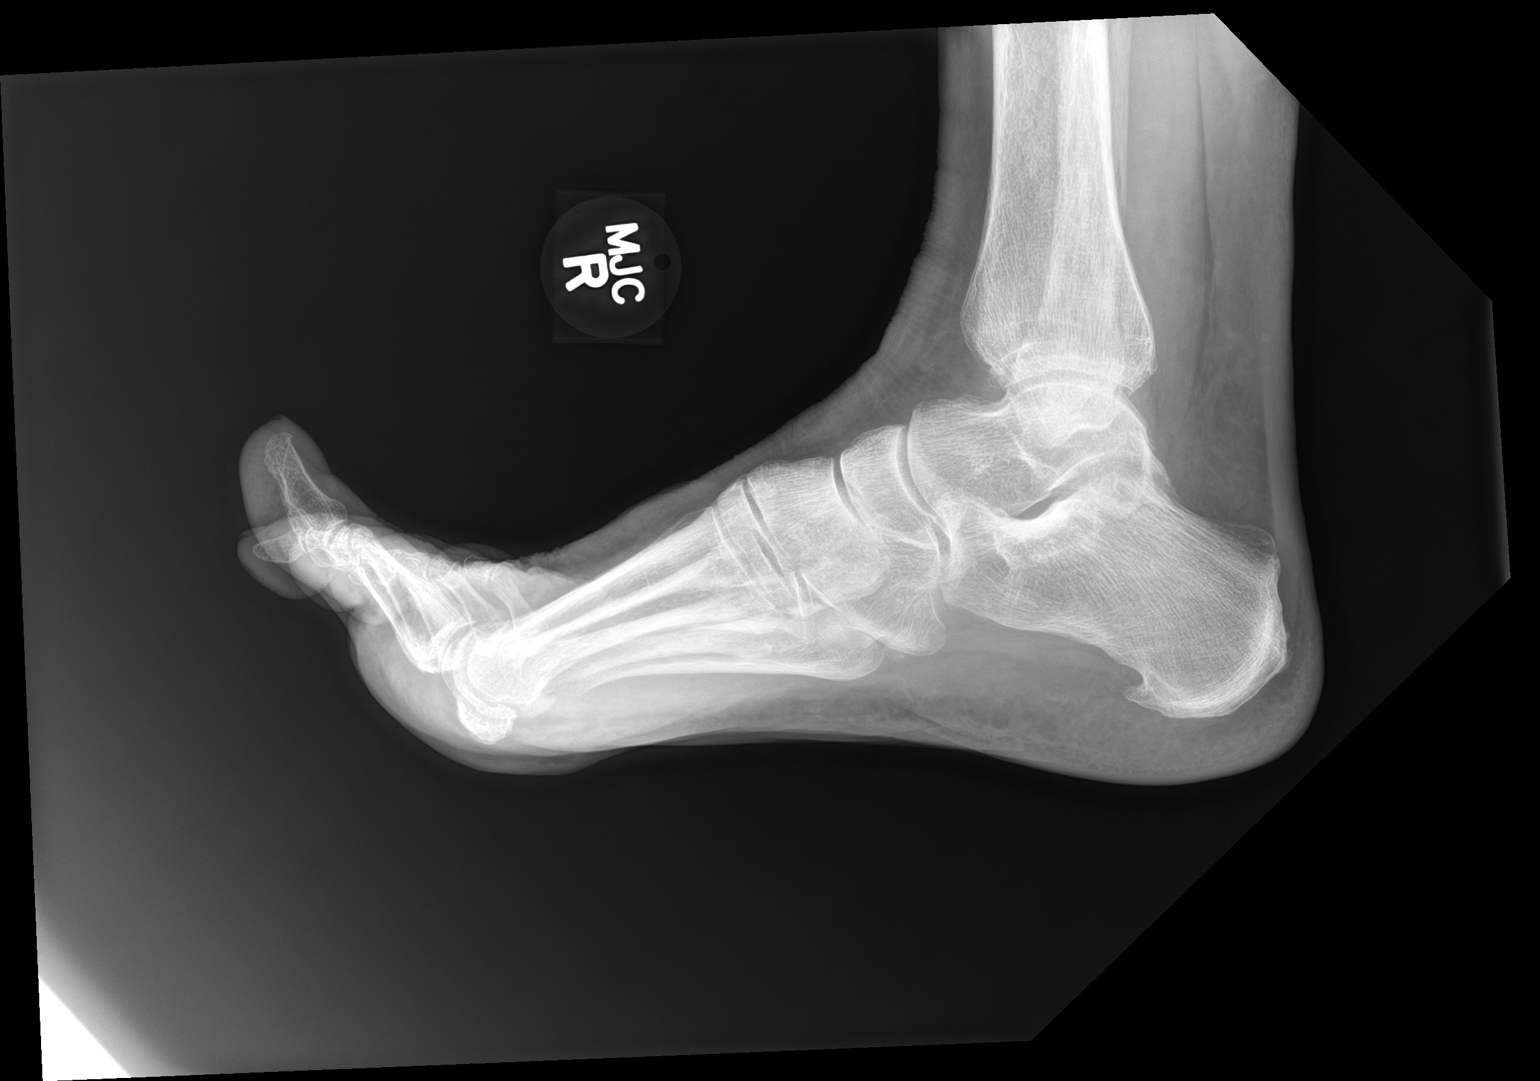

[3 of 3 positions shown; findings below may reference images not displayed]

FINDINGS: Right foot:

Mild great toe metatarsophalangeal joint space narrowing and
predominantly lateral peripheral degenerative osteophytosis. Mild
joint space narrowing of the interphalangeal joints diffusely.
Mild-to-moderate calcaneal heel spur. Mild distal tibial plafond
anterior osteophytosis. No acute fracture or dislocation.

Left foot:

Mild great toe metatarsophalangeal joint space narrowing and
predominantly lateral peripheral degenerative osteophytosis. Mild
joint space narrowing of the interphalangeal joints diffusely.
Mild-to-moderate calcaneal heel spur. No acute fracture or
dislocation.
IMPRESSION: :
IMPRESSION: 1. Mild bilateral great toe metatarsophalangeal joint
osteoarthritis.
2. Mild-to-moderate bilateral plantar calcaneal heel spurs.

## 2024-01-16 IMAGING — DX DG FOOT COMPLETE 3+V*L*
3 series · 3 of 3 positions shown · non-contrast
Comparison: None.

CLINICAL DATA: Bilateral foot pain. Right foot pain worsening for
12 days after missing a step.

EXAM:
LEFT FOOT - COMPLETE 3+ VIEW; RIGHT FOOT COMPLETE - 3+ VIEW

[foot ap]
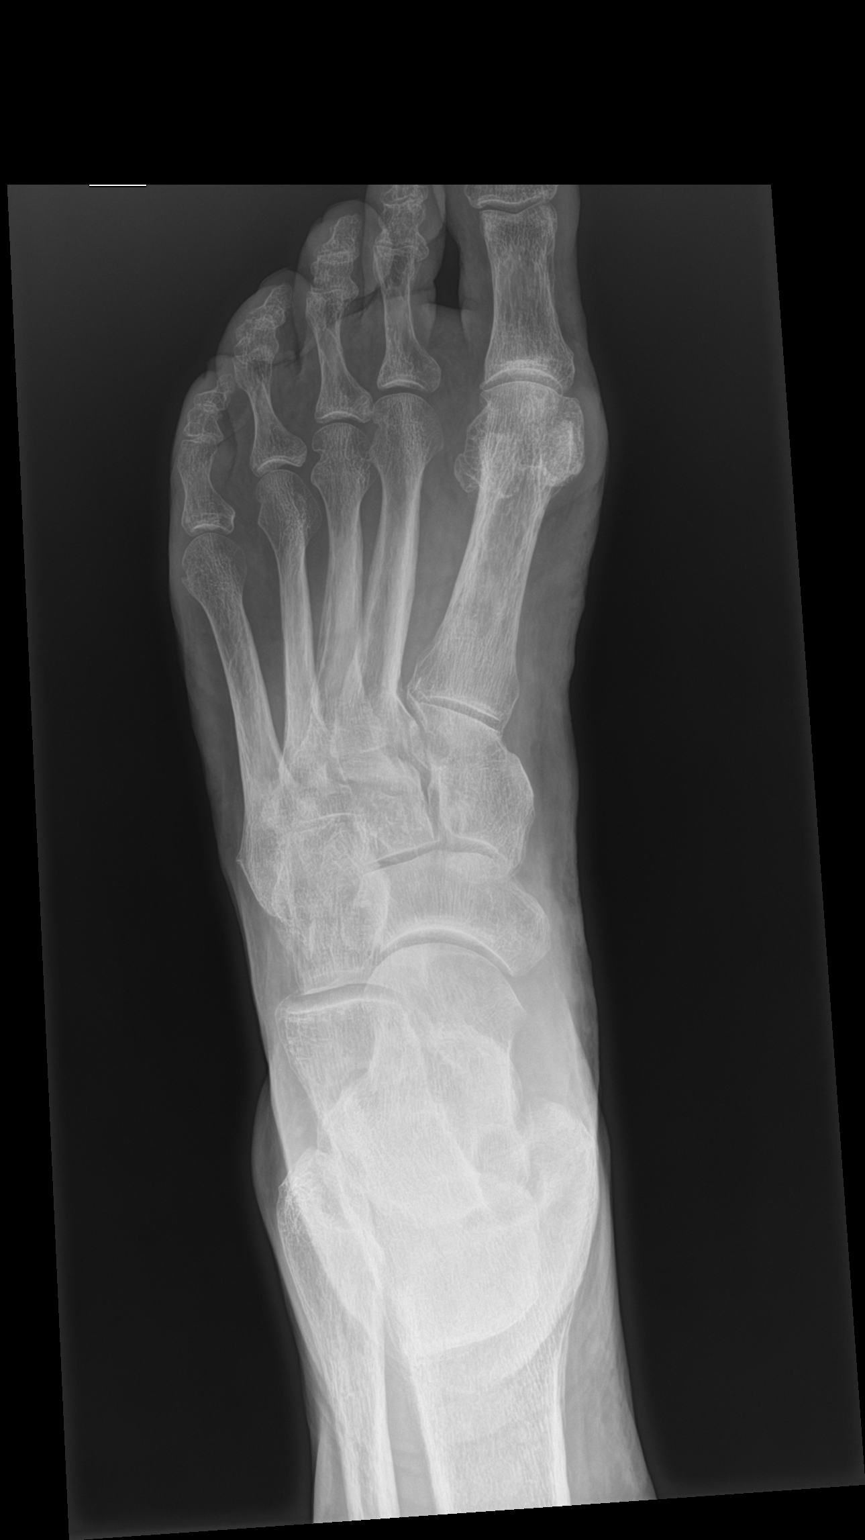

[foot obl]
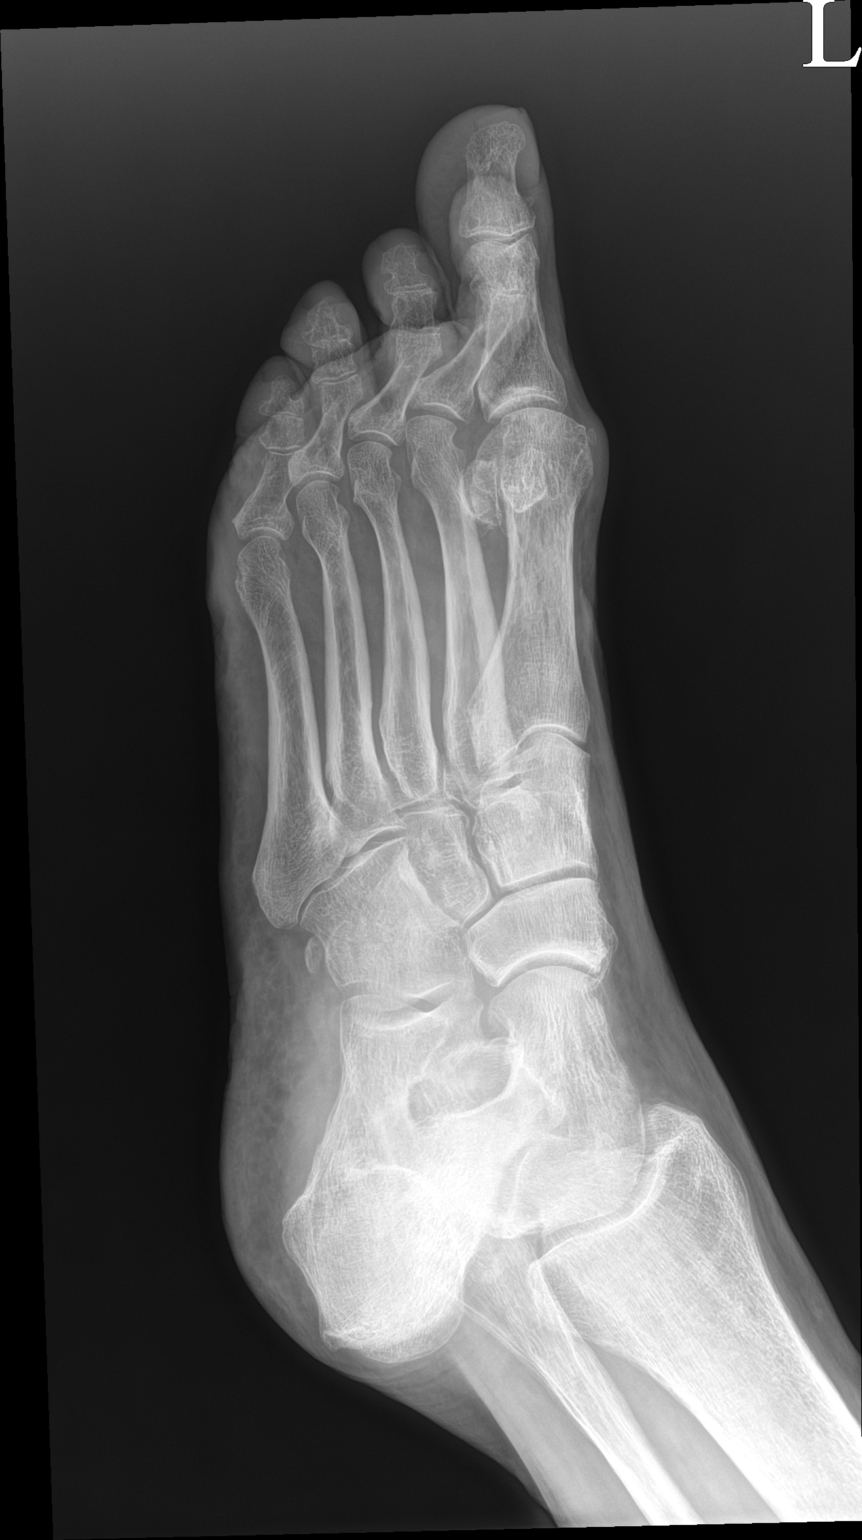

[foot lat]
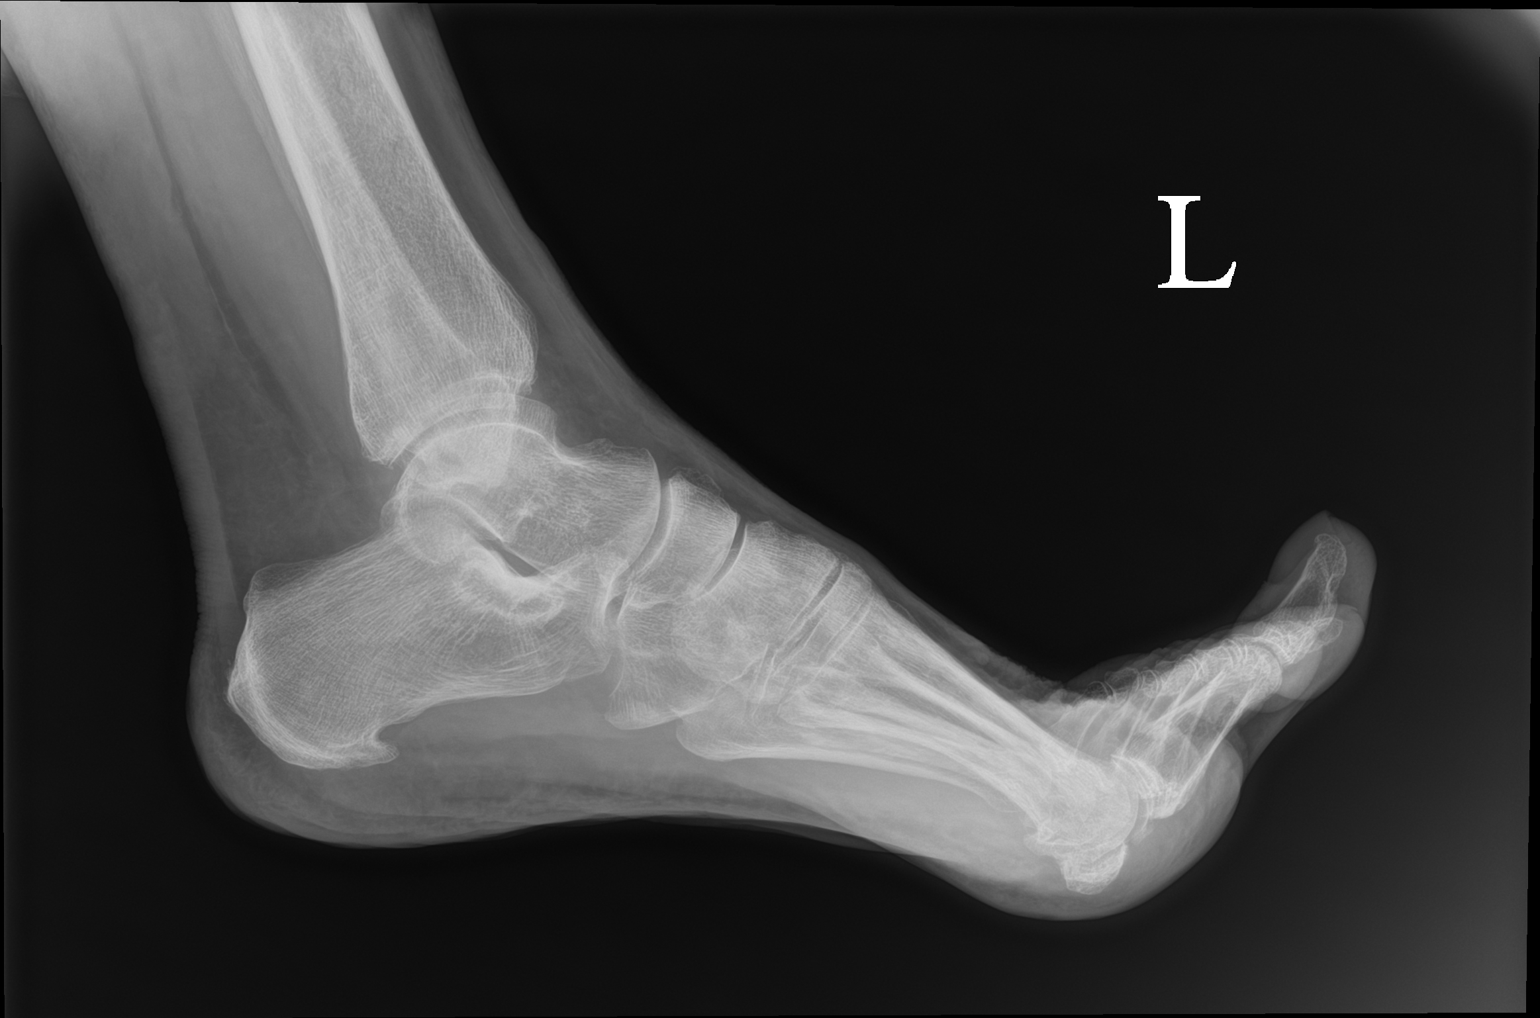

[3 of 3 positions shown; findings below may reference images not displayed]

FINDINGS: Right foot:

Mild great toe metatarsophalangeal joint space narrowing and
predominantly lateral peripheral degenerative osteophytosis. Mild
joint space narrowing of the interphalangeal joints diffusely.
Mild-to-moderate calcaneal heel spur. Mild distal tibial plafond
anterior osteophytosis. No acute fracture or dislocation.

Left foot:

Mild great toe metatarsophalangeal joint space narrowing and
predominantly lateral peripheral degenerative osteophytosis. Mild
joint space narrowing of the interphalangeal joints diffusely.
Mild-to-moderate calcaneal heel spur. No acute fracture or
dislocation.
IMPRESSION: :
IMPRESSION: 1. Mild bilateral great toe metatarsophalangeal joint
osteoarthritis.
2. Mild-to-moderate bilateral plantar calcaneal heel spurs.

## 2024-01-19 ENCOUNTER — Ambulatory Visit: Payer: Self-pay | Admitting: Family Medicine

## 2024-01-19 NOTE — Progress Notes (Signed)
 Right shoulder x-ray shows a little bit of arthritis but no broken bones.

## 2024-01-19 NOTE — Progress Notes (Signed)
 Low back x-ray shows some arthritis but no broken bones.

## 2024-01-19 NOTE — Progress Notes (Signed)
 Right elbow x-ray shows a radial head fracture.  We saw this in clinic on December 1.

## 2024-01-19 NOTE — Progress Notes (Signed)
 Left elbow x-ray looks okay to radiology.

## 2024-01-19 NOTE — Progress Notes (Signed)
 Left rib x-ray shows no visible fracture

## 2024-01-29 NOTE — Progress Notes (Unsigned)
 [name] [address] [place] [address] Phone: [phone] Subjective:    I'm seeing this patient by the request  of:  Kennyth Worth HERO, MD  CC:   YEP:Dlagzrupcz  01/15/2024 Radial head fracture.  Will continue with the conservative therapy and start range of motion exercises follow-up in 2 weeks to repeat x-rays to make sure no movement occurs and to start advancing appropriately.     Patient does have a rib fracture it appears seems to be at the T8 on the left side.  Nondisplaced.  Its the anterior aspect at the nipple line.  Patient told to take 10 deep breaths every hour.  Discussed with patient about potential lidocaine patches which patient declined.  Discussed tramadol  for any breakthrough pain.  Follow-up with me again 2 weeks and maybe repeat x-rays of this as well as patient's radial head fracture.      Update 02/02/2024 Kimberly [name] is a 73 y.o. female coming in with complaint of R wrist and L rib pain. Patient states       Past Medical History:  Diagnosis Date   Arthritis    Cataract    Diverticulosis    Meniere's disease, bilateral    Osteopenia    Past Surgical History:  Procedure Laterality Date   ABDOMINAL HYSTERECTOMY     CHOLECYSTECTOMY     EYE SURGERY     age 68   THYROIDECTOMY, PARTIAL     Social History   Socioeconomic History   Marital status: Single    Spouse name: Not on file   Number of children: Not on file   Years of education: Not on file   Highest education level: Bachelor's degree (e.g., BA, AB, BS)  Occupational History   Occupation: therapist, art  Tobacco Use   Smoking status: Every Day    Current packs/day: 1.00    Types: Cigarettes   Smokeless tobacco: Never  Vaping Use   Vaping status: Never Used  Substance and Sexual Activity   Alcohol use: Yes    Alcohol/week: 21.0 standard drinks of alcohol    Types: 21 Glasses of wine per week    Comment: 3 glasses of wine per day   Drug use:  Never   Sexual activity: Not Currently  Other Topics Concern   Not on file  Social History Narrative   Not on file   Social Drivers of Health   Tobacco Use: High Risk (01/15/2024)   Patient History    Smoking Tobacco Use: Every Day    Smokeless Tobacco Use: Never    Passive Exposure: Not on file  Financial Resource Strain: Low Risk (07/09/2023)   Overall Financial Resource Strain (CARDIA)    Difficulty of Paying Living Expenses: Not hard at all  Food Insecurity: No Food Insecurity (07/09/2023)   Hunger Vital Sign    Worried About Running Out of Food in the Last Year: Never true    Ran Out of Food in the Last Year: Never true  Transportation Needs: No Transportation Needs (07/09/2023)   PRAPARE - Administrator, Civil Service (Medical): No    Lack of Transportation (Non-Medical): No  Physical Activity: Inactive (07/09/2023)   Exercise Vital Sign    Days of Exercise per Week: 0 days    Minutes of Exercise per Session: 0 min  Stress: No Stress Concern Present (07/09/2023)   [employer] of Occupational Health - Occupational Stress Questionnaire    Feeling of Stress :  Only a little  Social Connections: Moderately Isolated (07/09/2023)   Social Connection and Isolation Panel    Frequency of Communication with Friends and Family: Three times a week    Frequency of Social Gatherings with Friends and Family: Three times a week    Attends Religious Services: Never    Active Member of Clubs or Organizations: Yes    Attends Banker Meetings: 1 to 4 times per year    Marital Status: Never married  Depression (PHQ2-9): Low Risk (07/09/2023)   Depression (PHQ2-9)    PHQ-2 Score: 1  Recent Concern: Depression (PHQ2-9) - High Risk (06/02/2023)   Depression (PHQ2-9)    PHQ-2 Score: 17  Alcohol Screen: Low Risk (07/09/2023)   Alcohol Screen    Last Alcohol Screening Score (AUDIT): 1  Housing: Low Risk (07/09/2023)   Housing Stability Vital Sign    Unable to Pay for  Housing in the Last Year: No    Number of Times Moved in the Last Year: 0    Homeless in the Last Year: No  Utilities: Not At Risk (07/09/2023)   AHC Utilities    Threatened with loss of utilities: No  Health Literacy: Adequate Health Literacy (07/09/2023)   B1300 Health Literacy    Frequency of need for help with medical instructions: Never   Allergies[1] Family History  Problem Relation Age of Onset   Colon polyps Mother    Heart disease Mother    Heart disease Father    Stomach cancer Paternal Grandfather    Breast cancer Neg Hx     Current Outpatient Medications (Endocrine & Metabolic):    predniSONE  (DELTASONE ) 20 MG tablet, Take 1 tablet (20 mg total) by mouth daily with breakfast.  Current Outpatient Medications (Cardiovascular):    rosuvastatin  (CRESTOR ) 5 MG tablet, TAKE 1 TABLET BY MOUTH DAILY  Current Outpatient Medications (Respiratory):    azelastine  (ASTELIN ) 0.1 % nasal spray, Place 2 sprays into both nostrils 2 (two) times daily.   benzonatate  (TESSALON ) 200 MG capsule, Take 1 capsule (200 mg total) by mouth 2 (two) times daily as needed for cough.   cetirizine (ZYRTEC) 10 MG tablet, Take 10 mg by mouth daily.  Current Outpatient Medications (Hematological):    cyanocobalamin  (VITAMIN B12) 1000 MCG tablet, Take 1,000 mcg by mouth daily.  Current Outpatient Medications (Other):    buPROPion  (WELLBUTRIN  XL) 150 MG 24 hr tablet, Take 1 tablet (150 mg total) by mouth daily.   Calcium  Carbonate-Vit D-Min (CALCIUM  1200 PO), Take by mouth daily.   Cholecalciferol (VITAMIN D3) 1000 units CAPS, Take by mouth.   gabapentin  (NEURONTIN ) 100 MG capsule, TAKE 2 CAPSULES BY MOUTH AT BEDTIME   meclizine  (ANTIVERT ) 25 MG tablet, Take 1 tablet (25 mg total) by mouth 3 (three) times daily as needed for dizziness.   Multiple Vitamins-Minerals (HAIR SKIN AND NAILS FORMULA PO), Take by mouth.   Multiple Vitamins-Minerals (WOMENS 50+ MULTI VITAMIN PO), Take by mouth.   vitamin E 180  MG (400 UNITS) capsule, Take 400 Units by mouth daily.   Reviewed prior external information including notes and imaging from  primary care provider As well as notes that were available from care everywhere and other healthcare systems.  Past medical history, social, surgical and family history all reviewed in electronic medical record.  No pertanent information unless stated regarding to the chief complaint.   Review of Systems:  No headache, visual changes, nausea, vomiting, diarrhea, constipation, dizziness, abdominal pain, skin rash, fevers, chills, night sweats,  weight loss, swollen lymph nodes, body aches, joint swelling, chest pain, shortness of breath, mood changes. POSITIVE muscle aches  Objective  There were no vitals taken for this visit.   General: No apparent distress alert and oriented x3 mood and affect normal, dressed appropriately.  HEENT: Pupils equal, extraocular movements intact  Respiratory: Patient's speak in full sentences and does not appear short of breath  Cardiovascular: No lower extremity edema, non tender, no erythema      Impression and Recommendations:           [1]  Allergies Allergen Reactions   Erythromycin Diarrhea   Clindamycin/Lincomycin Nausea And Vomiting   Morphine And Codeine Itching   Latex Rash

## 2024-02-02 ENCOUNTER — Ambulatory Visit: Admitting: Family Medicine

## 2024-02-02 ENCOUNTER — Other Ambulatory Visit: Payer: Self-pay

## 2024-02-02 ENCOUNTER — Encounter: Payer: Self-pay | Admitting: Family Medicine

## 2024-02-02 ENCOUNTER — Ambulatory Visit

## 2024-02-02 ENCOUNTER — Other Ambulatory Visit: Payer: Self-pay | Admitting: Family Medicine

## 2024-02-02 VITALS — Ht 68.0 in

## 2024-02-02 DIAGNOSIS — M25522 Pain in left elbow: Secondary | ICD-10-CM

## 2024-02-02 DIAGNOSIS — M25531 Pain in right wrist: Secondary | ICD-10-CM

## 2024-02-02 DIAGNOSIS — M25521 Pain in right elbow: Secondary | ICD-10-CM

## 2024-02-02 DIAGNOSIS — M79601 Pain in right arm: Secondary | ICD-10-CM | POA: Diagnosis not present

## 2024-02-02 DIAGNOSIS — S52124D Nondisplaced fracture of head of right radius, subsequent encounter for closed fracture with routine healing: Secondary | ICD-10-CM

## 2024-02-02 DIAGNOSIS — S63501A Unspecified sprain of right wrist, initial encounter: Secondary | ICD-10-CM | POA: Diagnosis not present

## 2024-02-02 NOTE — Assessment & Plan Note (Signed)
 Another recent fall.  Discussed icing regimen and home exercises, discussed which activities to do and which ones to avoid.  Increase activity slowly.  Discussed potential bracing for short course of time.

## 2024-02-02 NOTE — Assessment & Plan Note (Signed)
 Interval improvement noted.  Discussed with patient continue to work on range of motion.  Lacks last 2 degrees of extension in the last 15 degrees of flexion.  Improvement in strength though already noted.  Discussed icing regimen.  Follow-up again in 6 to 12 weeks.

## 2024-02-02 NOTE — Patient Instructions (Addendum)
 See me the week of the 19th (ok to double book) Xray prior to next appt  Epleys maneuver Wrist wear brace daily for one week

## 2024-02-10 ENCOUNTER — Ambulatory Visit: Payer: Self-pay

## 2024-02-10 NOTE — Telephone Encounter (Signed)
 FYI Only or Action Required?: FYI only for provider: appointment scheduled on 02/13/24.  Patient was last seen in primary care on 06/02/2023 by Kennyth Worth HERO, MD.  Called Nurse Triage reporting Facial Pain.  Symptoms began about a month ago.  Interventions attempted: OTC medications: Tylenol, allergy medication.  Symptoms are: unchanged.  Triage Disposition: See PCP When Office is Open (Within 3 Days)  Patient/caregiver understands and will follow disposition?: Yes  Copied from CRM #[id number]. Topic: Clinical - Red Word Triage >> Feb 10, 2024  8:45 AM Vena HERO wrote: Red Word that prompted transfer to Nurse Triage: ear pain, gum pain, swollen lymph nodes, head aches, feels like sinus infection Reason for Disposition  [1] Sinus congestion (pressure, fullness) AND [2] present > 10 days  Answer Assessment - Initial Assessment Questions Patient says she's been dealing with an earache to the right ear and sinus issues x 1 month, but not the typical symptoms of congestion, fever, cold symptoms. She says her temp last checked was low at 97.4. Pain to the forehead, jaw/teeth 4/10; lymph nodes jaw seem hard like an infection. She says her balance is off since all this, but able to walk without holding on. She's taking 2 ES Tylenol daily for the pain w/no relief. She says she feels dehydrated even though she's drinking plenty of fluids, lips/skin dry, doesn't feel like she gets enough hydration. Advised no availability at [facility], but could schedule on Friday at [facility] or if needs to be seen sooner go to the [facility]. She agreed to Friday appointment.     1. LOCATION: Where does it hurt?      Jaw, teeth, headache  2. ONSET: When did the sinus pain start?  (e.g., hours, days)      Within last month  3. SEVERITY: How bad is the pain?   (Scale 0-10; or none, mild, moderate or severe)     4  4. RECURRENT SYMPTOM: Have you ever had sinus problems before? If Yes, ask: When was the  last time? and What happened that time?      Not really  5. NASAL CONGESTION: Is the nose blocked? If Yes, ask: Can you open it or must you breathe through your mouth?     No  6. NASAL DISCHARGE: Do you have discharge from your nose? If so ask, What color?     No  7. FEVER: Do you have a fever? If Yes, ask: What is it, how was it measured, and when did it start?      No 97.9  8. OTHER SYMPTOMS: Do you have any other symptoms? (e.g., sore throat, cough, earache, difficulty breathing)     Right earache, off balance when walking  Protocols used: Sinus Pain or Congestion-A-AH

## 2024-02-10 NOTE — Telephone Encounter (Signed)
 Noted

## 2024-02-13 ENCOUNTER — Encounter: Payer: Self-pay | Admitting: Internal Medicine

## 2024-02-13 ENCOUNTER — Ambulatory Visit: Admitting: Internal Medicine

## 2024-02-13 VITALS — BP 130/70 | HR 73 | Temp 98.3°F | Ht 68.0 in | Wt 201.2 lb

## 2024-02-13 DIAGNOSIS — J0110 Acute frontal sinusitis, unspecified: Secondary | ICD-10-CM | POA: Diagnosis not present

## 2024-02-13 DIAGNOSIS — H6591 Unspecified nonsuppurative otitis media, right ear: Secondary | ICD-10-CM | POA: Diagnosis not present

## 2024-02-13 MED ORDER — AMOXICILLIN-POT CLAVULANATE 875-125 MG PO TABS: 1.0000 | ORAL_TABLET | Freq: Two times a day (BID) | ORAL | 0 refills | Status: AC

## 2024-02-13 NOTE — Patient Instructions (Signed)
 We have sent in augmentin to take 1 pill twice a day for 1 week.

## 2024-02-13 NOTE — Progress Notes (Signed)
 "  Subjective:   Patient ID: Kimberly [name], female    DOB: February 13, 1950, 74 y.o.   MRN: [id number]  Discussed the use of AI scribe software for clinical note transcription with the patient, who gave verbal consent to proceed.  History of Present Illness Kimberly [name] is a 74 year old female who presents with sinus pressure and dizziness.  She has been experiencing sinus pressure and dizziness for nearly a month, which she associates with a fall on November 27th. The pressure is primarily in her right ear and sinus area, extending to her jaw and teeth, resembling a sinus infection but without typical drainage or discolored phlegm. No significant nasal congestion, drainage, fever, chills, cough, breathing problems, hearing changes, or neck stiffness. She reports slight congestion and occasional sinus pressure, particularly in the right ear.  Her dizziness is described as a feeling of instability, especially when walking, getting out of bed, or standing up. Her right leg does not take a curve as well as her left, and she often feels unstable when stepping with her right foot first. She believes quitting smoking on December 11th has affected her balance and possibly contributed to her symptoms.  She has a history of a crown disintegrating while eating bacon, which she relates to her current jaw pain. A dental appointment is scheduled for January 8th to address potential dental issues.  Her medication history includes a previous prescription of Augmentin  in March for a similar issue. She has allergies to erythromycin, clindamycin, morphine, and latex, which cause side effects rather than severe allergic reactions. She also mentions not liking the effects of tramadol , having taken eight out of ten prescribed tablets.  Review of Systems  Constitutional: Negative.   HENT:  Positive for congestion and ear pain.   Eyes: Negative.   Respiratory:  Negative for cough, chest tightness and shortness of breath.    Cardiovascular:  Negative for chest pain, palpitations and leg swelling.  Gastrointestinal:  Negative for abdominal distention, abdominal pain, constipation, diarrhea, nausea and vomiting.  Musculoskeletal:  Positive for myalgias.  Skin: Negative.   Neurological:  Positive for dizziness.  Psychiatric/Behavioral: Negative.      Objective:  Physical Exam Constitutional:      Appearance: She is well-developed.  HENT:     Head: Normocephalic and atraumatic.     Left Ear: Tympanic membrane normal.     Ears:     Comments: Right TM bulging Cardiovascular:     Rate and Rhythm: Normal rate and regular rhythm.  Pulmonary:     Effort: Pulmonary effort is normal. No respiratory distress.     Breath sounds: Normal breath sounds. No wheezing or rales.  Abdominal:     General: Bowel sounds are normal. There is no distension.     Palpations: Abdomen is soft.     Tenderness: There is no abdominal tenderness.  Musculoskeletal:     Cervical back: Normal range of motion.  Skin:    General: Skin is warm and dry.  Neurological:     Mental Status: She is alert and oriented to person, place, and time.     Coordination: Coordination normal.     Vitals:   02/13/24 1130 02/13/24 1142  BP: (!) 140/70 130/70  Pulse: 73   Temp: 98.3 F (36.8 C)   TempSrc: Oral   SpO2: 97%   Weight: 201 lb 3.2 oz (91.3 kg)   Height: 5' 8 (1.727 m)     Assessment and Plan Assessment & Plan  Acute sinusitis with right otitis media   She experiences sinus pressure, discomfort, and right ear pain with fluid in the ear and mild throat redness, suggesting a possible lower-grade sinus infection with fluid and pressure. Balance issues related to smoking cessation may cause vertigo-like symptoms. Start Augmentin , 1 pill twice a day for 7 days.   Nicotine  dependence, in remission   Her nicotine  dependence has been in remission since December 11th. Smoking cessation may initially increase congestion, contributing to  sinus symptoms. Long-term benefits include reduced chronic inflammation and improved sinus drainage. Encourage continued smoking cessation.   "

## 2024-02-16 ENCOUNTER — Ambulatory Visit: Payer: Self-pay | Admitting: Family Medicine

## 2024-03-02 NOTE — Progress Notes (Unsigned)
 " Kimberly [name] [name] [address] [place] [address] Phone: [phone] Subjective:   Kimberly [name], am serving as a scribe for Dr. Arthea [name].  I'm seeing this patient by the request  of:  Kennyth Worth HERO, MD  CC: Right elbow and wrist pain  YEP:Dlagzrupcz  02/02/2024 Another recent fall.  Discussed icing regimen and home exercises, discussed which activities to do and which ones to avoid.  Increase activity slowly.  Discussed potential bracing for short course of time.     Interval improvement noted.  Discussed with patient continue to work on range of motion.  Lacks last 2 degrees of extension in the last 15 degrees of flexion.  Improvement in strength though already noted.  Discussed icing regimen.  Follow-up again in 6 to 12 weeks.     Update 03/03/2024 Kimberly [name] is a 74 y.o. female coming in with complaint of R elbow and wrist pain. Patient states doing well overall.  Patient states that the right elbow is completely nearly pain-free at the moment.  Has been doing some range of motion exercises.  Nothing that stopping from activity though.      Past Medical History:  Diagnosis Date   Arthritis    Cataract    Diverticulosis    Meniere's disease, bilateral    Osteopenia    Past Surgical History:  Procedure Laterality Date   ABDOMINAL HYSTERECTOMY     CHOLECYSTECTOMY     EYE SURGERY     age 65   THYROIDECTOMY, PARTIAL     Social History   Socioeconomic History   Marital status: Single    Spouse name: Not on file   Number of children: Not on file   Years of education: Not on file   Highest education level: Bachelor's degree (e.g., BA, AB, BS)  Occupational History   Occupation: therapist, art  Tobacco Use   Smoking status: Every Day    Current packs/day: 1.00    Types: Cigarettes   Smokeless tobacco: Current   Tobacco comments:    Pt stopped smoking 01/22/24  Vaping Use   Vaping status: Never Used  Substance and Sexual  Activity   Alcohol use: Yes    Alcohol/week: 21.0 standard drinks of alcohol    Types: 21 Glasses of wine per week    Comment: 3 glasses of wine per day   Drug use: Never   Sexual activity: Not Currently  Other Topics Concern   Not on file  Social History Narrative   Not on file   Social Drivers of Health   Tobacco Use: High Risk (02/13/2024)   Patient History    Smoking Tobacco Use: Every Day    Smokeless Tobacco Use: Current    Passive Exposure: Not on file  Financial Resource Strain: Low Risk (02/10/2024)   Overall Financial Resource Strain (CARDIA)    Difficulty of Paying Living Expenses: Not hard at all  Food Insecurity: No Food Insecurity (02/10/2024)   Epic    Worried About Programme Researcher, Broadcasting/film/video in the Last Year: Never true    Ran Out of Food in the Last Year: Never true  Transportation Needs: No Transportation Needs (02/10/2024)   Epic    Lack of Transportation (Medical): No    Lack of Transportation (Non-Medical): No  Physical Activity: Inactive (02/10/2024)   Exercise Vital Sign    Days of Exercise per Week: 0 days    Minutes of Exercise per Session: Not on file  Stress: No Stress Concern Present (02/10/2024)   [employer] of Occupational Health - Occupational Stress Questionnaire    Feeling of Stress: Only a little  Social Connections: Socially Isolated (02/10/2024)   Social Connection and Isolation Panel    Frequency of Communication with Friends and Family: More than three times a week    Frequency of Social Gatherings with Friends and Family: More than three times a week    Attends Religious Services: Never    Database Administrator or Organizations: No    Attends Engineer, Structural: Not on file    Marital Status: Divorced  Depression (PHQ2-9): Low Risk (07/09/2023)   Depression (PHQ2-9)    PHQ-2 Score: 1  Recent Concern: Depression (PHQ2-9) - High Risk (06/02/2023)   Depression (PHQ2-9)    PHQ-2 Score: 17  Alcohol Screen: Low Risk  (02/10/2024)   Alcohol Screen    Last Alcohol Screening Score (AUDIT): 4  Housing: Low Risk (02/10/2024)   Epic    Unable to Pay for Housing in the Last Year: No    Number of Times Moved in the Last Year: 0    Homeless in the Last Year: No  Utilities: Not At Risk (07/09/2023)   [facility] Utilities    Threatened with loss of utilities: No  Health Literacy: Adequate Health Literacy (07/09/2023)   B1300 Health Literacy    Frequency of need for help with medical instructions: Never   Allergies[1] Family History  Problem Relation Age of Onset   Colon polyps Mother    Heart disease Mother    Heart disease Father    Stomach cancer Paternal Grandfather    Breast cancer Neg Hx     Current Outpatient Medications (Endocrine & Metabolic):    predniSONE  (DELTASONE ) 20 MG tablet, Take 1 tablet (20 mg total) by mouth daily with breakfast. (Patient not taking: Reported on 02/13/2024)  Current Outpatient Medications (Cardiovascular):    rosuvastatin  (CRESTOR ) 5 MG tablet, TAKE 1 TABLET BY MOUTH DAILY  Current Outpatient Medications (Respiratory):    azelastine  (ASTELIN ) 0.1 % nasal spray, Place 2 sprays into both nostrils 2 (two) times daily.   benzonatate  (TESSALON ) 200 MG capsule, Take 1 capsule (200 mg total) by mouth 2 (two) times daily as needed for cough.   cetirizine (ZYRTEC) 10 MG tablet, Take 10 mg by mouth daily.  Current Outpatient Medications (Hematological):    cyanocobalamin  (VITAMIN B12) 1000 MCG tablet, Take 1,000 mcg by mouth daily.  Current Outpatient Medications (Other):    buPROPion  (WELLBUTRIN  XL) 150 MG 24 hr tablet, Take 1 tablet (150 mg total) by mouth daily.   Calcium  Carbonate-Vit D-Min (CALCIUM  1200 PO), Take by mouth daily.   Cholecalciferol (VITAMIN D3) 1000 units CAPS, Take by mouth.   gabapentin  (NEURONTIN ) 100 MG capsule, TAKE 2 CAPSULES BY MOUTH AT BEDTIME   meclizine  (ANTIVERT ) 25 MG tablet, Take 1 tablet (25 mg total) by mouth 3 (three) times daily as needed for  dizziness.   Multiple Vitamins-Minerals (HAIR SKIN AND NAILS FORMULA PO), Take by mouth.   Multiple Vitamins-Minerals (WOMENS 50+ MULTI VITAMIN PO), Take by mouth.   vitamin E 180 MG (400 UNITS) capsule, Take 400 Units by mouth daily.   Reviewed prior external information including notes and imaging from  primary care provider As well as notes that were available from care everywhere and other healthcare systems.  Past medical history, social, surgical and family history all reviewed in electronic medical record.  No pertanent information unless stated regarding to  the chief complaint.   Review of Systems:  No headache, visual changes, nausea, vomiting, diarrhea, constipation, dizziness, abdominal pain, skin rash, fevers, chills, night sweats, weight loss, swollen lymph nodes, body aches, joint swelling, chest pain, shortness of breath, mood changes. POSITIVE muscle aches  Objective  There were no vitals taken for this visit.   General: No apparent distress alert and oriented x3 mood and affect normal, dressed appropriately.  HEENT: Pupils equal, extraocular movements intact  Respiratory: Patient's speak in full sentences and does not appear short of breath  Cardiovascular: No lower extremity edema, non tender, no erythema  Right elbow exam shows patient does have good range of motion.  Still some tenderness with some resisted internal pronation and supination. Low back does have some loss of lordosis.  Osteopathic findings C2 flexed rotated and side bent right C4 flexed rotated and side bent left C6 flexed rotated and side bent left T11 extended rotated and side bent left L3 flexed rotated and side bent left Sacrum right on right   Limited muscular skeletal ultrasound was performed and interpreted by [name] HUSSAR, M  Limited ultrasound shows that patient is completely healed at this time.  Callus formation noted over the radial head.  Hard callus noted   Impression and  Recommendations:  Closed nondisplaced fracture of head of right radius Hard callus noted, patient still has some reabsorption on x-ray.  We discussed starting to increase activity as tolerated.  [place]  Follow-up with me again in 6 to 8 weeks and we will check patient's back again at that time    Decision today to treat with OMT was based on Physical Exam  After verbal consent patient was treated with  ME, FPR techniques in cervical, thoracic,  lumbar and sacral areas, all areas are chronic avoided all high velocity.  Patient tolerated the procedure well with improvement in symptoms  Patient given exercises, stretches and lifestyle modifications  See medications in patient instructions if given  Patient will follow up in 4-8 weeks  The above documentation has been reviewed and is accurate and complete Hussar CHRISTELLA Claudene, DO        [1]  Allergies Allergen Reactions   Erythromycin Diarrhea   Clindamycin/Lincomycin Nausea And Vomiting   Morphine And Codeine Itching   Latex Rash   "

## 2024-03-03 ENCOUNTER — Ambulatory Visit

## 2024-03-03 ENCOUNTER — Other Ambulatory Visit: Payer: Self-pay

## 2024-03-03 ENCOUNTER — Ambulatory Visit: Admitting: Family Medicine

## 2024-03-03 VITALS — BP 132/86 | HR 78 | Ht 68.0 in | Wt 201.0 lb

## 2024-03-03 DIAGNOSIS — S52124D Nondisplaced fracture of head of right radius, subsequent encounter for closed fracture with routine healing: Secondary | ICD-10-CM | POA: Diagnosis not present

## 2024-03-03 DIAGNOSIS — M9904 Segmental and somatic dysfunction of sacral region: Secondary | ICD-10-CM | POA: Diagnosis not present

## 2024-03-03 DIAGNOSIS — M545 Low back pain, unspecified: Secondary | ICD-10-CM | POA: Diagnosis not present

## 2024-03-03 DIAGNOSIS — M79601 Pain in right arm: Secondary | ICD-10-CM

## 2024-03-03 DIAGNOSIS — M9903 Segmental and somatic dysfunction of lumbar region: Secondary | ICD-10-CM | POA: Diagnosis not present

## 2024-03-03 DIAGNOSIS — M9902 Segmental and somatic dysfunction of thoracic region: Secondary | ICD-10-CM | POA: Diagnosis not present

## 2024-03-03 DIAGNOSIS — M25522 Pain in left elbow: Secondary | ICD-10-CM

## 2024-03-03 DIAGNOSIS — M9901 Segmental and somatic dysfunction of cervical region: Secondary | ICD-10-CM | POA: Diagnosis not present

## 2024-03-03 DIAGNOSIS — M25521 Pain in right elbow: Secondary | ICD-10-CM

## 2024-03-03 NOTE — Patient Instructions (Signed)
 Elbow looks good Enjoy the beach See me in 6 weeks

## 2024-03-03 NOTE — Assessment & Plan Note (Signed)
 Hard callus noted, patient still has some reabsorption on x-ray.  We discussed starting to increase activity as tolerated.  [place]  Follow-up with me again in 6 to 8 weeks and we will check patient's back again at that time

## 2024-03-03 NOTE — Assessment & Plan Note (Signed)
 Chronic, tightness, with patient traveling given some home exercises again.  Discussed with patient about icing regimen and home exercises, discussed which activities to do and which ones to avoid.  Increase activity slowly.  Follow-up again in 6 to 8 weeks.

## 2024-04-15 ENCOUNTER — Ambulatory Visit: Admitting: Family Medicine

## 2024-04-26 ENCOUNTER — Other Ambulatory Visit (HOSPITAL_BASED_OUTPATIENT_CLINIC_OR_DEPARTMENT_OTHER)

## 2024-07-19 ENCOUNTER — Ambulatory Visit
